# Patient Record
Sex: Female | Born: 1935 | Race: White | Hispanic: No | Smoking: Former smoker
Health system: Southern US, Community
[De-identification: ages and names within clinical notes are randomized; demographics above are authoritative.]

## PROBLEM LIST (undated history)

## (undated) DIAGNOSIS — I6523 Occlusion and stenosis of bilateral carotid arteries: Secondary | ICD-10-CM

## (undated) DIAGNOSIS — K219 Gastro-esophageal reflux disease without esophagitis: Secondary | ICD-10-CM

## (undated) DIAGNOSIS — R0600 Dyspnea, unspecified: Secondary | ICD-10-CM

## (undated) DIAGNOSIS — I7143 Infrarenal abdominal aortic aneurysm, without rupture: Secondary | ICD-10-CM

## (undated) DIAGNOSIS — D631 Anemia in chronic kidney disease: Secondary | ICD-10-CM

## (undated) DIAGNOSIS — E119 Type 2 diabetes mellitus without complications: Secondary | ICD-10-CM

## (undated) DIAGNOSIS — I10 Essential (primary) hypertension: Secondary | ICD-10-CM

## (undated) DIAGNOSIS — N189 Chronic kidney disease, unspecified: Secondary | ICD-10-CM

## (undated) DIAGNOSIS — N184 Chronic kidney disease, stage 4 (severe): Secondary | ICD-10-CM

## (undated) DIAGNOSIS — K449 Diaphragmatic hernia without obstruction or gangrene: Secondary | ICD-10-CM

## (undated) DIAGNOSIS — Z974 Presence of external hearing-aid: Secondary | ICD-10-CM

## (undated) DIAGNOSIS — Z973 Presence of spectacles and contact lenses: Secondary | ICD-10-CM

## (undated) DIAGNOSIS — F32A Depression, unspecified: Secondary | ICD-10-CM

## (undated) DIAGNOSIS — Z8679 Personal history of other diseases of the circulatory system: Secondary | ICD-10-CM

## (undated) DIAGNOSIS — N201 Calculus of ureter: Secondary | ICD-10-CM

## (undated) DIAGNOSIS — I714 Abdominal aortic aneurysm, without rupture: Secondary | ICD-10-CM

## (undated) DIAGNOSIS — N200 Calculus of kidney: Secondary | ICD-10-CM

## (undated) DIAGNOSIS — I700 Atherosclerosis of aorta: Secondary | ICD-10-CM

## (undated) DIAGNOSIS — Z8616 Personal history of COVID-19: Secondary | ICD-10-CM

## (undated) DIAGNOSIS — E039 Hypothyroidism, unspecified: Secondary | ICD-10-CM

## (undated) DIAGNOSIS — H269 Unspecified cataract: Secondary | ICD-10-CM

## (undated) DIAGNOSIS — R0609 Other forms of dyspnea: Secondary | ICD-10-CM

## (undated) DIAGNOSIS — I447 Left bundle-branch block, unspecified: Secondary | ICD-10-CM

## (undated) DIAGNOSIS — N135 Crossing vessel and stricture of ureter without hydronephrosis: Secondary | ICD-10-CM

## (undated) DIAGNOSIS — G4730 Sleep apnea, unspecified: Secondary | ICD-10-CM

## (undated) DIAGNOSIS — J449 Chronic obstructive pulmonary disease, unspecified: Secondary | ICD-10-CM

## (undated) DIAGNOSIS — Z87442 Personal history of urinary calculi: Secondary | ICD-10-CM

## (undated) DIAGNOSIS — K08109 Complete loss of teeth, unspecified cause, unspecified class: Secondary | ICD-10-CM

## (undated) DIAGNOSIS — N261 Atrophy of kidney (terminal): Secondary | ICD-10-CM

## (undated) DIAGNOSIS — N2581 Secondary hyperparathyroidism of renal origin: Secondary | ICD-10-CM

## (undated) HISTORY — PX: URETEROSCOPY WITH HOLMIUM LASER LITHOTRIPSY: SHX6645

---

## 1999-08-14 ENCOUNTER — Encounter: Payer: Self-pay | Admitting: Geriatric Medicine

## 1999-08-14 ENCOUNTER — Encounter: Admission: RE | Admit: 1999-08-14 | Discharge: 1999-08-14 | Payer: Self-pay | Admitting: Geriatric Medicine

## 2000-10-04 ENCOUNTER — Encounter: Payer: Self-pay | Admitting: Internal Medicine

## 2000-10-04 ENCOUNTER — Encounter: Admission: RE | Admit: 2000-10-04 | Discharge: 2000-10-04 | Payer: Self-pay | Admitting: Internal Medicine

## 2000-10-07 ENCOUNTER — Encounter: Admission: RE | Admit: 2000-10-07 | Discharge: 2000-10-07 | Payer: Self-pay | Admitting: Internal Medicine

## 2000-10-07 ENCOUNTER — Encounter: Payer: Self-pay | Admitting: Internal Medicine

## 2000-10-13 ENCOUNTER — Encounter: Payer: Self-pay | Admitting: Urology

## 2000-10-14 ENCOUNTER — Encounter: Payer: Self-pay | Admitting: Urology

## 2000-10-14 ENCOUNTER — Ambulatory Visit (HOSPITAL_COMMUNITY): Admission: RE | Admit: 2000-10-14 | Discharge: 2000-10-14 | Payer: Self-pay | Admitting: Urology

## 2002-07-31 ENCOUNTER — Encounter: Admission: RE | Admit: 2002-07-31 | Discharge: 2002-07-31 | Payer: Self-pay | Admitting: Geriatric Medicine

## 2002-07-31 ENCOUNTER — Encounter: Payer: Self-pay | Admitting: Geriatric Medicine

## 2005-04-22 ENCOUNTER — Encounter: Admission: RE | Admit: 2005-04-22 | Discharge: 2005-04-22 | Payer: Self-pay | Admitting: Internal Medicine

## 2005-07-31 ENCOUNTER — Encounter: Admission: RE | Admit: 2005-07-31 | Discharge: 2005-07-31 | Payer: Self-pay | Admitting: Internal Medicine

## 2006-02-11 ENCOUNTER — Other Ambulatory Visit: Admission: RE | Admit: 2006-02-11 | Discharge: 2006-02-11 | Payer: Self-pay | Admitting: Internal Medicine

## 2006-10-15 ENCOUNTER — Encounter: Admission: RE | Admit: 2006-10-15 | Discharge: 2006-10-15 | Payer: Self-pay | Admitting: Internal Medicine

## 2007-02-24 ENCOUNTER — Other Ambulatory Visit: Admission: RE | Admit: 2007-02-24 | Discharge: 2007-02-24 | Payer: Self-pay | Admitting: Internal Medicine

## 2007-03-04 ENCOUNTER — Encounter: Admission: RE | Admit: 2007-03-04 | Discharge: 2007-03-04 | Payer: Self-pay | Admitting: Internal Medicine

## 2007-05-30 ENCOUNTER — Ambulatory Visit (HOSPITAL_COMMUNITY): Admission: RE | Admit: 2007-05-30 | Discharge: 2007-05-30 | Payer: Self-pay | Admitting: Urology

## 2010-10-14 NOTE — Op Note (Signed)
Tami Richards, Tami Richards      ACCOUNT NO.:  [id number]   MEDICAL RECORD NO.:  [id number]          PATIENT TYPE:  AMB   LOCATION:  DAY                          FACILITY:  [facility]   PHYSICIAN:  Lillette Boxer. Dahlstedt, M.D.DATE OF BIRTH:  1935/02/21   DATE OF PROCEDURE:  05/30/2007  DATE OF DISCHARGE:                               OPERATIVE REPORT   PREOPERATIVE DIAGNOSIS:  Left ureteral calculi.   POSTOPERATIVE DIAGNOSIS:  Left ureteral calculi.   PRINCIPAL PROCEDURES:  Cystoscopy, left retrograde ureteral pyelogram,  left ureteroscopy, holmium laser fragmentation of left ureteral calculi,  basket extraction of calculi, double-J stent placement on the left.   SURGEON:  Dahlstedt   ANESTHESIA:  General with LMA.   COMPLICATIONS:  None.   BRIEF HISTORY:  A 76 year old female presents at this time for  definitive treatment of left distal ureteral stones.  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  She has had nonpassage of stones which she has had at least  for several months.  There is some left renal atrophy.  She presents at  this time for definitive treatment.  Risks and complications of the  procedure have been discussed with the patient.  She understands these  and desires to proceed.   DESCRIPTION OF PROCEDURE:  The patient was administered preoperative IV  antibiotics.  The surgical side was marked.  She was taken to the  operating room where general anesthetic was administered using the LMA.  She was placed in a dorsal lithotomy position.  The genitalia and  perineum were prepped and draped.  A 22-French panendoscope was passed  into her bladder which appeared to be normal.  A 6-French open-ended  catheter was used to perform retrograde pyelogram on the left.   There were two filling defects in the distal ureter.  There was no  significant hydroureter proximal to this but the pyelocaliceal system on  the left was  dilated.  No filling defects were seen there.  At this  point a guidewire was passed through the left ureter using fluoroscopic  guidance.  The open-ended catheter was removed.  A ureteroscope replaced  the cystoscope.  The stones were encountered about 1 cm up the left  ureter.  They were then fragmented into multiple pieces with the 365  micron laser fiber set at a power of 1 joule.  Multiple small fragments  were then performed.  The largest of these fragments were then extracted  into the bladder using a nitinol basket.  Several passes were made to  extract the larger stones.  Small powder-like fragments were left in  situ.  The stones were then irrigated out of the bladder with the  cystoscope.  A double-J stent was then placed over the guidewire using a  6-French 24-cm Contour stent.  The string was left on the distal end.  Good curls were seen proximally and distally  once the guidewire was removed.  The bladder was then drained and the  scope removed.  The string was taped to the patient's inner thigh.   The patient tolerated  this procedure well.  She was awakened and taken  to the PACU in stable condition.      Lillette Boxer. Dahlstedt, M.D.  Electronically Signed     SMD/MEDQ  D:  05/30/2007  T:  05/30/2007  Job:  [id number]

## 2010-10-17 NOTE — Op Note (Signed)
[facility]  Patient:    Tami Richards, Tami Richards             MRN: [id number] Proc. Date: 10/14/00 Adm. Date:  [id number] Attending:  Paschal Dopp                           Operative Report  PREOPERATIVE DIAGNOSIS:  Left mid ureteral stone.  POSTOPERATIVE DIAGNOSIS:  Left mid ureteral stone.  PRINCIPAL PROCEDURE:  Cystoscopy, left retrograde ureteropyelogram, holmium laser lithotripsy ureteroscopically of left mid ureteral calculus.  SURGEON:  Lillette Boxer. Dahlstedt, M.D.  ANESTHESIA:  General with LMA.  COMPLICATIONS:  None.  BRIEF HISTORY:  A 76 year old female with a six week history of left flank pain.  She was recently found to have a left mid ureteral stone.  It was recommended that the patient have treatment of this stone, as she has recurrent pain with no progression of her stone over the past month and a half.  The risks and complications of all treatments including lithotripsy, ureteroscopy with laser, and basket extraction were discussed.  The patient had decided to proceed with ureteroscopy.  I have recommended laser lithotripsy.  She presents at this time knowing the risks and complications and desires to proceed.  DESCRIPTION OF PROCEDURE:  The patient was administered a general anesthetic using the LMA.  One gram of Ancef was administered preoperatively.  She was placed in the dorsal lithotomy position with genitalia and perineum prepped and draped.  A cystoscope was advanced into her bladder which was cystoscopically normal.  The left ureter was cannulated with an end-hole catheter, and retrograde revealed filling defect overlying the distal sacral ureter.  At this point, the cystoscope was removed, and a 6 French ureteroscope was advanced up to this filling defect which was the stone. Using the laser fiber and the holmium laser at .5 joules, the stone was fragmented quite easily into multiple small fragments that would  easily pass. No other ureteral filling defects or stones were seen.  At this point, the ureteroscope was removed and the bladder drained.  The patient tolerated the procedure well.  She was awakened, extubated, and taken to PACU in stable condition. DD:  10/14/00 TD:  10/14/00 Job: [id number]

## 2011-03-06 LAB — CBC
Hemoglobin: 13.6
MCHC: 34.6
Platelets: 239
RBC: 4.35
RDW: 14

## 2011-03-06 LAB — BASIC METABOLIC PANEL
CO2: 29
Chloride: 103
Creatinine, Ser: 1.56 — ABNORMAL HIGH
GFR calc Af Amer: 40 — ABNORMAL LOW
Sodium: 142

## 2011-03-06 LAB — URINALYSIS, ROUTINE W REFLEX MICROSCOPIC
Glucose, UA: NEGATIVE
Hgb urine dipstick: NEGATIVE
Nitrite: NEGATIVE
Protein, ur: NEGATIVE
Urobilinogen, UA: 1

## 2011-10-01 ENCOUNTER — Encounter (HOSPITAL_COMMUNITY): Payer: Self-pay | Admitting: Emergency Medicine

## 2011-10-01 ENCOUNTER — Emergency Department (HOSPITAL_COMMUNITY)
Admission: EM | Admit: 2011-10-01 | Discharge: 2011-10-01 | Disposition: A | Discharge status: Hospice | Payer: Self-pay | Attending: Emergency Medicine | Admitting: Emergency Medicine

## 2011-10-01 DIAGNOSIS — H5789 Other specified disorders of eye and adnexa: Secondary | ICD-10-CM | POA: Insufficient documentation

## 2011-10-01 DIAGNOSIS — K219 Gastro-esophageal reflux disease without esophagitis: Secondary | ICD-10-CM | POA: Insufficient documentation

## 2011-10-01 DIAGNOSIS — I10 Essential (primary) hypertension: Secondary | ICD-10-CM | POA: Insufficient documentation

## 2011-10-01 DIAGNOSIS — Z79899 Other long term (current) drug therapy: Secondary | ICD-10-CM | POA: Insufficient documentation

## 2011-10-01 DIAGNOSIS — Z87442 Personal history of urinary calculi: Secondary | ICD-10-CM | POA: Insufficient documentation

## 2011-10-01 DIAGNOSIS — L259 Unspecified contact dermatitis, unspecified cause: Secondary | ICD-10-CM | POA: Insufficient documentation

## 2011-10-01 HISTORY — DX: Essential (primary) hypertension: I10

## 2011-10-01 HISTORY — DX: Gastro-esophageal reflux disease without esophagitis: K21.9

## 2011-10-01 HISTORY — DX: Calculus of kidney: N20.0

## 2011-10-01 MED ORDER — DIPHENHYDRAMINE HCL 25 MG PO CAPS
25.0000 mg | ORAL_CAPSULE | Freq: Once | ORAL | Status: AC
  Administered 2011-10-01: 25 mg via ORAL
  Filled 2011-10-01: qty 1

## 2011-10-01 MED ORDER — PREDNISONE 10 MG PO TABS: 20.0000 mg | ORAL_TABLET | Freq: Every day | ORAL | Status: DC

## 2011-10-01 MED ORDER — PREDNISONE 20 MG PO TABS
60.0000 mg | ORAL_TABLET | Freq: Once | ORAL | Status: AC
  Administered 2011-10-01: 60 mg via ORAL
  Filled 2011-10-01: qty 3

## 2011-10-01 NOTE — ED Provider Notes (Signed)
History  Scribed for Shaune Pollack, MD, the patient was seen in room STRE5/STRE5. This chart was scribed by Truddie Coco. The patient's care started at 1:37 PM    CSN: [id number]  Arrival date & time 10/01/11  1221   First MD Initiated Contact with Patient 10/01/11 1319      Chief Complaint  Patient presents with  . Rash     HPI GENEVIE FEURTADO is a 77 y.o. female who presents to the Emergency Department complaining of a rash that started on her face about six days ago.  She states that the next day her eyes began to swell and the rash had spread all over her face.  She denies any animals in the house, new creams or lotions, or any time spent outside.  Pt states that she has taken cortaid and anti itch and burn cream with no relief.  She has h/o HTN.  She denies difficulty swallowing or speaking.  Pt does not have a PCP at this time.  Was given information on the [facility].   Past Medical History  Diagnosis Date  . Hypertension   . Kidney stones   . Acid reflux     No past surgical history on file.  No family history on file.  History  Substance Use Topics  . Smoking status: Former Research scientist (life sciences)  . Smokeless tobacco: Not on file  . Alcohol Use: No    OB History    Grav Para Term Preterm Abortions TAB SAB Ect Mult Living                  Review of Systems  Skin: Positive for rash (on face).  All other systems reviewed and are negative.    Allergies  Review of patient's allergies indicates no known allergies.  Home Medications   Current Outpatient Rx  Name Route Sig Dispense Refill  . ACETAMINOPHEN 500 MG PO TABS Oral Take 500-1,000 mg by mouth every 6 (six) hours as needed. For pain    . B COMPLEX-C PO TABS Oral Take 1 tablet by mouth daily.    . CHOLECALCIFEROL 400 UNITS PO TABS Oral Take 400 Units by mouth daily.    Marland Kitchen FENUGREEK 500 MG PO CAPS Oral Take 1 tablet by mouth daily.    Marland Kitchen FOLATE-B12-INTRINSIC FACTOR 800-500-20 MCG-MCG-MG PO TABS  Oral Take 1 tablet by mouth daily.    Marland Kitchen HYDROCORTISONE 1 % EX CREA Topical Apply 1 application topically 2 (two) times daily as needed. For itching    . IBUPROFEN 200 MG PO TABS Oral Take 400 mg by mouth every 6 (six) hours as needed. For pain    . LORATADINE 10 MG PO TABS Oral Take 10-20 mg by mouth daily.    Marland Kitchen MAGNESIUM 100 MG PO CAPS Oral Take 1 capsule by mouth daily.    . ADULT MULTIVITAMIN W/MINERALS CH Oral Take 1 tablet by mouth daily.    Marland Kitchen OVER THE COUNTER MEDICATION Oral Take 1 application by mouth 3 (three) times daily as needed. Dr Bea Graff anti itch cream      BP 163/87  Pulse 98  Temp 98.3 F (36.8 C)  Resp 16  SpO2 99%  Physical Exam  Nursing note and vitals reviewed. Constitutional: She is oriented to person, place, and time. She appears well-developed and well-nourished. No distress.  HENT:  Head: Normocephalic and atraumatic.  Mouth/Throat: No oropharyngeal exudate.  Eyes: EOM are normal.  Neck: Normal range of motion. Neck supple.  Neurological: She is alert and oriented to person, place, and time.  Skin: Rash (on face) noted. She is not diaphoretic.       Macular papular diffuse facial rash, blanching, erythremic.   Psychiatric: She has a normal mood and affect. Her behavior is normal.    ED Course  Procedures  DIAGNOSTIC STUDIES: Oxygen Saturation is 99% on room air, normal by my interpretation.    COORDINATION OF CARE:     Labs Reviewed - No data to display No results found.   No diagnosis found. Patient with rash on face consistent with contact dermatitis. She is to be with prednisone and Benadryl. She is advised regarding followup of her hypertension. She states she is unable to obtain care due to financial reasons. Again patient advised to followup with health syrup or [facility] clinic   I personally performed the services described in this documentation, which was scribed in my presence. The recorded information has been reviewed and  considered.        Shaune Pollack, MD 10/01/11 1758

## 2011-10-01 NOTE — ED Notes (Signed)
Broke out in rash around eyes since sat am   Family has poison oak and just visited her

## 2011-10-01 NOTE — Discharge Instructions (Signed)
Contact Dermatitis Contact dermatitis is a reaction to certain substances that touch the skin. Contact dermatitis can be either irritant contact dermatitis or allergic contact dermatitis. Irritant contact dermatitis does not require previous exposure to the substance for a reaction to occur.Allergic contact dermatitis only occurs if you have been exposed to the substance before. Upon a repeat exposure, your body reacts to the substance.  CAUSES  Many substances can cause contact dermatitis. Irritant dermatitis is most commonly caused by repeated exposure to mildly irritating substances, such as:  Makeup.   Soaps.   Detergents.   Bleaches.   Acids.   Metal salts, such as nickel.  Allergic contact dermatitis is most commonly caused by exposure to:  Poisonous plants.   Chemicals (deodorants, shampoos).   Jewelry.   Latex.   Neomycin in triple antibiotic cream.   Preservatives in products, including clothing.  SYMPTOMS  The area of skin that is exposed may develop:  Dryness or flaking.   Redness.   Cracks.   Itching.   Pain or a burning sensation.   Blisters.  With allergic contact dermatitis, there may also be swelling in areas such as the eyelids, mouth, or genitals.  DIAGNOSIS  Your caregiver can usually tell what the problem is by doing a physical exam. In cases where the cause is uncertain and an allergic contact dermatitis is suspected, a patch skin test may be performed to help determine the cause of your dermatitis. TREATMENT Treatment includes protecting the skin from further contact with the irritating substance by avoiding that substance if possible. Barrier creams, powders, and gloves may be helpful. Your caregiver may also recommend:  Steroid creams or ointments applied 2 times daily. For best results, soak the rash area in cool water for 20 minutes. Then apply the medicine. Cover the area with a plastic wrap. You can store the steroid cream in the  refrigerator for a "chilly" effect on your rash. That may decrease itching. Oral steroid medicines may be needed in more severe cases.   Antibiotics or antibacterial ointments if a skin infection is present.   Antihistamine lotion or an antihistamine taken by mouth to ease itching.   Lubricants to keep moisture in your skin.   Burow's solution to reduce redness and soreness or to dry a weeping rash. Mix one packet or tablet of solution in 2 cups cool water. Dip a clean washcloth in the mixture, wring it out a bit, and put it on the affected area. Leave the cloth in place for 30 minutes. Do this as often as possible throughout the day.   Taking several cornstarch or baking soda baths daily if the area is too large to cover with a washcloth.  Harsh chemicals, such as alkalis or acids, can cause skin damage that is like a burn. You should flush your skin for 15 to 20 minutes with cold water after such an exposure. You should also seek immediate medical care after exposure. Bandages (dressings), antibiotics, and pain medicine may be needed for severely irritated skin.  HOME CARE INSTRUCTIONS  Avoid the substance that caused your reaction.   Keep the area of skin that is affected away from hot water, soap, sunlight, chemicals, acidic substances, or anything else that would irritate your skin.   Do not scratch the rash. Scratching may cause the rash to become infected.   You may take cool baths to help stop the itching.   Only take over-the-counter or prescription medicines as directed by your caregiver.     See your caregiver for follow-up care as directed to make sure your skin is healing properly.  SEEK MEDICAL CARE IF:   Your condition is not better after 3 days of treatment.   You seem to be getting worse.   You see signs of infection such as swelling, tenderness, redness, soreness, or warmth in the affected area.   You have any problems related to your medicines.  Document Released:  05/15/2000 Document Revised: 05/07/2011 Document Reviewed: 10/21/2010 [facility] Patient Information 2012 [place], [place].Hypertension As your heart beats, it forces blood through your arteries. This force is your blood pressure. If the pressure is too high, it is called hypertension (HTN) or high blood pressure. HTN is dangerous because you may have it and not know it. High blood pressure may mean that your heart has to work harder to pump blood. Your arteries may be narrow or stiff. The extra work puts you at risk for heart disease, stroke, and other problems.  Blood pressure consists of two numbers, a higher number over a lower, 110/72, for example. It is stated as "110 over 72." The ideal is below 120 for the top number (systolic) and under 80 for the bottom (diastolic). Write down your blood pressure today. You should pay close attention to your blood pressure if you have certain conditions such as:  Heart failure.   Prior heart attack.   Diabetes   Chronic kidney disease.   Prior stroke.   Multiple risk factors for heart disease.  To see if you have HTN, your blood pressure should be measured while you are seated with your arm held at the level of the heart. It should be measured at least twice. A one-time elevated blood pressure reading (especially in the Emergency Department) does not mean that you need treatment. There may be conditions in which the blood pressure is different between your right and left arms. It is important to see your caregiver soon for a recheck. Most people have essential hypertension which means that there is not a specific cause. This type of high blood pressure may be lowered by changing lifestyle factors such as:  Stress.   Smoking.   Lack of exercise.   Excessive weight.   Drug/tobacco/alcohol use.   Eating less salt.  Most people do not have symptoms from high blood pressure until it has caused damage to the body. Effective treatment can often  prevent, delay or reduce that damage. TREATMENT  When a cause has been identified, treatment for high blood pressure is directed at the cause. There are a large number of medications to treat HTN. These fall into several categories, and your caregiver will help you select the medicines that are best for you. Medications may have side effects. You should review side effects with your caregiver. If your blood pressure stays high after you have made lifestyle changes or started on medicines,   Your medication(s) may need to be changed.   Other problems may need to be addressed.   Be certain you understand your prescriptions, and know how and when to take your medicine.   Be sure to follow up with your caregiver within the time frame advised (usually within two weeks) to have your blood pressure rechecked and to review your medications.   If you are taking more than one medicine to lower your blood pressure, make sure you know how and at what times they should be taken. Taking two medicines at the same time can result in blood pressure that  is too low.  SEEK IMMEDIATE MEDICAL CARE IF:  You develop a severe headache, blurred or changing vision, or confusion.   You have unusual weakness or numbness, or a faint feeling.   You have severe chest or abdominal pain, vomiting, or breathing problems.  MAKE SURE YOU:   Understand these instructions.   Will watch your condition.   Will get help right away if you are not doing well or get worse.  Document Released: 05/18/2005 Document Revised: 05/07/2011 Document Reviewed: 01/06/2008 [facility] Patient Information 2012 [place].   Use diphenhydramine 25 mg over the counter every six hours until symptoms resolve.

## 2013-08-22 ENCOUNTER — Other Ambulatory Visit: Payer: Self-pay | Admitting: Internal Medicine

## 2013-08-22 ENCOUNTER — Ambulatory Visit
Admission: RE | Admit: 2013-08-22 | Discharge: 2013-08-22 | Disposition: A | Discharge status: Home / Self Care | Payer: No Typology Code available for payment source | Source: Ambulatory Visit | Attending: Internal Medicine | Admitting: Internal Medicine

## 2013-08-22 DIAGNOSIS — R0602 Shortness of breath: Secondary | ICD-10-CM

## 2015-03-04 ENCOUNTER — Encounter (HOSPITAL_COMMUNITY): Payer: Self-pay | Admitting: Emergency Medicine

## 2015-03-04 ENCOUNTER — Emergency Department (HOSPITAL_COMMUNITY): Payer: No Typology Code available for payment source

## 2015-03-04 ENCOUNTER — Emergency Department (HOSPITAL_COMMUNITY)
Admission: EM | Admit: 2015-03-04 | Discharge: 2015-03-04 | Disposition: A | Discharge status: Home / Self Care | Payer: No Typology Code available for payment source | Attending: Emergency Medicine | Admitting: Emergency Medicine

## 2015-03-04 DIAGNOSIS — Z87442 Personal history of urinary calculi: Secondary | ICD-10-CM | POA: Insufficient documentation

## 2015-03-04 DIAGNOSIS — S0081XA Abrasion of other part of head, initial encounter: Secondary | ICD-10-CM | POA: Diagnosis not present

## 2015-03-04 DIAGNOSIS — Y998 Other external cause status: Secondary | ICD-10-CM | POA: Diagnosis not present

## 2015-03-04 DIAGNOSIS — Z8719 Personal history of other diseases of the digestive system: Secondary | ICD-10-CM | POA: Insufficient documentation

## 2015-03-04 DIAGNOSIS — S2241XA Multiple fractures of ribs, right side, initial encounter for closed fracture: Secondary | ICD-10-CM | POA: Diagnosis not present

## 2015-03-04 DIAGNOSIS — Z7952 Long term (current) use of systemic steroids: Secondary | ICD-10-CM | POA: Diagnosis not present

## 2015-03-04 DIAGNOSIS — Z79899 Other long term (current) drug therapy: Secondary | ICD-10-CM | POA: Diagnosis not present

## 2015-03-04 DIAGNOSIS — S299XXA Unspecified injury of thorax, initial encounter: Secondary | ICD-10-CM | POA: Diagnosis present

## 2015-03-04 DIAGNOSIS — I10 Essential (primary) hypertension: Secondary | ICD-10-CM | POA: Insufficient documentation

## 2015-03-04 DIAGNOSIS — Z87891 Personal history of nicotine dependence: Secondary | ICD-10-CM | POA: Insufficient documentation

## 2015-03-04 DIAGNOSIS — S2223XA Sternal manubrial dissociation, initial encounter for closed fracture: Secondary | ICD-10-CM | POA: Diagnosis not present

## 2015-03-04 DIAGNOSIS — Y92410 Unspecified street and highway as the place of occurrence of the external cause: Secondary | ICD-10-CM | POA: Insufficient documentation

## 2015-03-04 DIAGNOSIS — S2231XA Fracture of one rib, right side, initial encounter for closed fracture: Secondary | ICD-10-CM

## 2015-03-04 DIAGNOSIS — S2220XA Unspecified fracture of sternum, initial encounter for closed fracture: Secondary | ICD-10-CM

## 2015-03-04 DIAGNOSIS — Y9389 Activity, other specified: Secondary | ICD-10-CM | POA: Insufficient documentation

## 2015-03-04 MED ORDER — HYDROCODONE-ACETAMINOPHEN 5-325 MG PO TABS: 2.0000 | ORAL_TABLET | ORAL | Status: DC | PRN

## 2015-03-04 MED ORDER — IBUPROFEN 800 MG PO TABS
800.0000 mg | ORAL_TABLET | Freq: Once | ORAL | Status: AC
  Administered 2015-03-04: 800 mg via ORAL
  Filled 2015-03-04: qty 1

## 2015-03-04 MED ORDER — HYDROCODONE-ACETAMINOPHEN 5-325 MG PO TABS
2.0000 | ORAL_TABLET | Freq: Once | ORAL | Status: AC
  Administered 2015-03-04: 2 via ORAL
  Filled 2015-03-04: qty 2

## 2015-03-04 MED ORDER — IBUPROFEN 800 MG PO TABS: 800.0000 mg | ORAL_TABLET | Freq: Three times a day (TID) | ORAL | Status: DC

## 2015-03-04 NOTE — ED Notes (Signed)
Bed: WA02 Expected date:  Expected time:  Means of arrival:  Comments: EMS 

## 2015-03-04 NOTE — Discharge Instructions (Signed)
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
## 2016-04-02 ENCOUNTER — Other Ambulatory Visit: Payer: Self-pay | Admitting: Internal Medicine

## 2016-04-02 ENCOUNTER — Ambulatory Visit
Admission: RE | Admit: 2016-04-02 | Discharge: 2016-04-02 | Disposition: A | Discharge status: Home / Self Care | Payer: Medicare HMO | Source: Ambulatory Visit | Attending: Internal Medicine | Admitting: Internal Medicine

## 2016-04-02 DIAGNOSIS — J449 Chronic obstructive pulmonary disease, unspecified: Secondary | ICD-10-CM

## 2017-01-30 DIAGNOSIS — S32000A Wedge compression fracture of unspecified lumbar vertebra, initial encounter for closed fracture: Secondary | ICD-10-CM

## 2017-01-30 HISTORY — DX: Wedge compression fracture of unspecified lumbar vertebra, initial encounter for closed fracture: S32.000A

## 2017-02-11 ENCOUNTER — Encounter (HOSPITAL_COMMUNITY): Payer: Self-pay | Admitting: Emergency Medicine

## 2017-02-11 ENCOUNTER — Observation Stay (HOSPITAL_COMMUNITY)
Admission: EM | Admit: 2017-02-11 | Discharge: 2017-02-12 | Disposition: A | Discharge status: Home / Self Care | Payer: Medicare HMO | Attending: Emergency Medicine | Admitting: Emergency Medicine

## 2017-02-11 ENCOUNTER — Emergency Department (HOSPITAL_COMMUNITY): Payer: Medicare HMO

## 2017-02-11 DIAGNOSIS — M4316 Spondylolisthesis, lumbar region: Secondary | ICD-10-CM | POA: Diagnosis not present

## 2017-02-11 DIAGNOSIS — N289 Disorder of kidney and ureter, unspecified: Secondary | ICD-10-CM | POA: Diagnosis not present

## 2017-02-11 DIAGNOSIS — K5900 Constipation, unspecified: Principal | ICD-10-CM | POA: Insufficient documentation

## 2017-02-11 DIAGNOSIS — K219 Gastro-esophageal reflux disease without esophagitis: Secondary | ICD-10-CM | POA: Insufficient documentation

## 2017-02-11 DIAGNOSIS — Z87891 Personal history of nicotine dependence: Secondary | ICD-10-CM | POA: Insufficient documentation

## 2017-02-11 DIAGNOSIS — Z87442 Personal history of urinary calculi: Secondary | ICD-10-CM | POA: Insufficient documentation

## 2017-02-11 DIAGNOSIS — E1140 Type 2 diabetes mellitus with diabetic neuropathy, unspecified: Secondary | ICD-10-CM | POA: Diagnosis not present

## 2017-02-11 DIAGNOSIS — M5126 Other intervertebral disc displacement, lumbar region: Secondary | ICD-10-CM | POA: Insufficient documentation

## 2017-02-11 DIAGNOSIS — K449 Diaphragmatic hernia without obstruction or gangrene: Secondary | ICD-10-CM | POA: Diagnosis not present

## 2017-02-11 DIAGNOSIS — S32000A Wedge compression fracture of unspecified lumbar vertebra, initial encounter for closed fracture: Secondary | ICD-10-CM | POA: Diagnosis present

## 2017-02-11 DIAGNOSIS — S32010A Wedge compression fracture of first lumbar vertebra, initial encounter for closed fracture: Secondary | ICD-10-CM

## 2017-02-11 DIAGNOSIS — M5136 Other intervertebral disc degeneration, lumbar region: Secondary | ICD-10-CM | POA: Diagnosis not present

## 2017-02-11 DIAGNOSIS — W010XXA Fall on same level from slipping, tripping and stumbling without subsequent striking against object, initial encounter: Secondary | ICD-10-CM | POA: Diagnosis not present

## 2017-02-11 DIAGNOSIS — I700 Atherosclerosis of aorta: Secondary | ICD-10-CM | POA: Insufficient documentation

## 2017-02-11 DIAGNOSIS — N390 Urinary tract infection, site not specified: Secondary | ICD-10-CM | POA: Diagnosis present

## 2017-02-11 DIAGNOSIS — M549 Dorsalgia, unspecified: Secondary | ICD-10-CM | POA: Diagnosis present

## 2017-02-11 DIAGNOSIS — I10 Essential (primary) hypertension: Secondary | ICD-10-CM | POA: Diagnosis not present

## 2017-02-11 DIAGNOSIS — N179 Acute kidney failure, unspecified: Secondary | ICD-10-CM | POA: Diagnosis present

## 2017-02-11 DIAGNOSIS — E039 Hypothyroidism, unspecified: Secondary | ICD-10-CM | POA: Diagnosis present

## 2017-02-11 DIAGNOSIS — M4856XA Collapsed vertebra, not elsewhere classified, lumbar region, initial encounter for fracture: Secondary | ICD-10-CM | POA: Diagnosis not present

## 2017-02-11 DIAGNOSIS — E119 Type 2 diabetes mellitus without complications: Secondary | ICD-10-CM

## 2017-02-11 DIAGNOSIS — N3000 Acute cystitis without hematuria: Secondary | ICD-10-CM

## 2017-02-11 LAB — CBC WITH DIFFERENTIAL/PLATELET
Basophils Absolute: 0 10*3/uL (ref 0.0–0.1)
Basophils Relative: 0 %
EOS ABS: 0.2 10*3/uL (ref 0.0–0.7)
EOS PCT: 4 %
HCT: 36.1 % (ref 36.0–46.0)
Hemoglobin: 12.3 g/dL (ref 12.0–15.0)
LYMPHS ABS: 2.1 10*3/uL (ref 0.7–4.0)
Lymphocytes Relative: 31 %
MCH: 31.1 pg (ref 26.0–34.0)
MCHC: 34.1 g/dL (ref 30.0–36.0)
MCV: 91.4 fL (ref 78.0–100.0)
MONO ABS: 0.5 10*3/uL (ref 0.1–1.0)
MONOS PCT: 8 %
Neutro Abs: 4 10*3/uL (ref 1.7–7.7)
Neutrophils Relative %: 57 %
PLATELETS: 343 10*3/uL (ref 150–400)
RBC: 3.95 MIL/uL (ref 3.87–5.11)
RDW: 14.6 % (ref 11.5–15.5)
WBC: 6.9 10*3/uL (ref 4.0–10.5)

## 2017-02-11 NOTE — ED Provider Notes (Signed)
[facility] DEPT Provider Note   CSN: [id number] Arrival date & time: 02/11/17  1814     History   Chief Complaint Chief Complaint  Patient presents with  . constipation    HPI Tami Richards is a 82 y.o. female.  HPI atient presents to the emergency room for evaluation of a couple of issues. Her primary issue is that she has been having trouble with constipation since the last couple weeks of August. Patient was having severe constipation.  She has tried multiple medications. Patient was having severe pain on August 24 and thought she was going to have to come to the hospital but ultimately had a very large bowel movement.  Patient ended up following up with her primary care doctor on the 27th.He recommended increasing her stool softeners. She was also given medications for a urinary tract infection and her diabetic neuropathy. Couple days later the patient had a fall. Since that time she has had pain in her right hip and lower back. It increases whenever she tries to move or walk around. She also does not think she's had a normal bowel movement since that episode on the 24th. She denies any nausea or vomiting. No fevers or chills. Her symptoms have been progressing and she has not been able to have a good bowel movement so she came to the ED. Past Medical History:  Diagnosis Date  . Acid reflux   . Hypertension   . Kidney stones     There are no active problems to display for this patient.   History reviewed. No pertinent surgical history.  OB History    No data available       Home Medications    Prior to Admission medications   Medication Sig Start Date End Date Taking? Authorizing Provider  acetaminophen (TYLENOL) 500 MG tablet Take 500-1,000 mg by mouth every 6 (six) hours as needed. For pain    [provider]  B Complex-C (B-COMPLEX WITH VITAMIN C) tablet Take 1 tablet by mouth daily.    [provider]  cholecalciferol (VITAMIN D)  400 UNITS TABS Take 400 Units by mouth daily.    [provider]  Fenugreek 500 MG CAPS Take 1 tablet by mouth daily.    [provider]  Folate-B12-Intrinsic Factor [id number] MCG-MCG-MG TABS Take 1 tablet by mouth daily.    [provider]  HYDROcodone-acetaminophen (NORCO/VICODIN) 5-325 MG tablet Take 2 tablets by mouth every 4 (four) hours as needed. 03/04/15   Noemi Chapel, MD  hydrocortisone cream 1 % Apply 1 application topically 2 (two) times daily as needed. For itching    [provider]  ibuprofen (ADVIL,MOTRIN) 800 MG tablet Take 1 tablet (800 mg total) by mouth 3 (three) times daily. 03/04/15   Noemi Chapel, MD  loratadine (CLARITIN) 10 MG tablet Take 10-20 mg by mouth daily.    [provider]  Magnesium 100 MG CAPS Take 1 capsule by mouth daily.    [provider]  Multiple Vitamin (MULITIVITAMIN WITH MINERALS) TABS Take 1 tablet by mouth daily.    [provider]  OVER THE COUNTER MEDICATION Take 1 application by mouth 3 (three) times daily as needed. Dr Bea Graff anti itch cream    [provider]  predniSONE (DELTASONE) 10 MG tablet Take 2 tablets (20 mg total) by mouth daily. 10/01/11   Pattricia Boss, MD    Family History No family history on file.  Social History Social History  Substance Use Topics  .  Smoking status: Former Research scientist (life sciences)  . Smokeless tobacco: Never Used  . Alcohol use No     Allergies   Patient has no known allergies.   Review of Systems Review of Systems  Constitutional: Negative for fever.  Gastrointestinal: Negative for nausea and vomiting.  Genitourinary: Negative for dysuria.  All other systems reviewed and are negative.    Physical Exam Updated Vital Signs BP (!) 141/61 (BP Location: Left Arm)   Pulse 70   Temp 97.9 F (36.6 C) (Oral)   Resp 18   SpO2 97%   Physical Exam  Constitutional: No distress.  HENT:  Head: Normocephalic and atraumatic.  Right Ear:  External ear normal.  Left Ear: External ear normal.  Eyes: Conjunctivae are normal. Right eye exhibits no discharge. Left eye exhibits no discharge. No scleral icterus.  Neck: Neck supple. No tracheal deviation present.  Cardiovascular: Normal rate, regular rhythm and intact distal pulses.   Pulmonary/Chest: Effort normal and breath sounds normal. No stridor. No respiratory distress. She has no wheezes. She has no rales.  Abdominal: Soft. Bowel sounds are normal. She exhibits no distension. There is no tenderness. There is no rebound and no guarding.  Genitourinary:  Genitourinary Comments: Normal rectal exam, no stool in the vault, no fecal impaction  Musculoskeletal: She exhibits no edema or tenderness.  Neurological: She is alert. She has normal strength. No cranial nerve deficit (no facial droop, extraocular movements intact, no slurred speech) or sensory deficit. She exhibits normal muscle tone. She displays no seizure activity. Coordination normal.  Skin: Skin is warm and dry. No rash noted. She is not diaphoretic.  Psychiatric: She has a normal mood and affect.  Nursing note and vitals reviewed.    ED Treatments / Results  Labs (all labs ordered are listed, but only abnormal results are displayed) Labs Reviewed  CBC WITH DIFFERENTIAL/PLATELET  COMPREHENSIVE METABOLIC PANEL    Radiology Dg Lumbar Spine Complete  Result Date: 02/12/2017 CLINICAL DATA:  Midline lower back pain after fall on 01/27/2017. EXAM: LUMBAR SPINE - COMPLETE 4+ VIEW COMPARISON:  CXR from 04/02/2016 FINDINGS: New superior endplate compression fracture, possibly a mild burst fracture given minimal retropulsion noted of L1 with at least 25% height loss of the vertebral body anteriorly. There is degenerative disc disease at L4-5 and L5-S1 with grade 1 bordering grade 2 anterolisthesis of L5 on S1. Suggestion of a pars defect at L5 which may account for this. Rounded calcified appearing density in the right  hemiabdomen may reflect a large renal stone, gallstone, opacified diverticulum with lymph node among some possibilities though not exclusive. IMPRESSION: 1. New since 04/02/2016 is a superior endplate compression fracture of L1 with slight retropulsion suggested and therefore consistent with a mild burst fracture. CT for further characterization is suggested. 2. Lower lumbar degenerative disc disease L4 through S1 with grade 1 bordering grade 2 anterolisthesis of L5 on S1 possibly from a pars defect. 3. 12 mm calcified density in the right hemiabdomen with differential possibilities that may include a gallstone, renal stone, possibly a contrast filled diverticulum or lymph node among some possibilities. Electronically Signed   By: Ashley Royalty M.D.   On: 02/12/2017 00:03   Dg Abdomen Acute W/chest  Result Date: 02/11/2017 CLINICAL DATA:  The patient reports she has not had a bowel movement since 01/22/2017. EXAM: DG ABDOMEN ACUTE W/ 1V CHEST COMPARISON:  PA and lateral chest 04/02/2016. CT chest 05/04/2007. Chest and plain films of the ribs 03/04/2015. FINDINGS: Single-view of the chest  demonstrates clear lungs and normal heart size. No pneumothorax or pleural effusion. Aortic atherosclerosis is noted. Two views of the abdomen show no free intraperitoneal air. Large stool burden throughout the colon is noted. No evidence of bowel obstruction Calcification in the right upper quadrant of the abdomen is compatible with nonobstructing renal stone seen on prior exams. IMPRESSION: No acute abnormality. Large colonic stool burden. Unchanged calcification the right upper quadrant the abdomen consistent with a nonobstructing renal stone. Electronically Signed   By: Inge Rise M.D.   On: 02/11/2017 23:58   Dg Hip Unilat With Pelvis 2-3 Views Right  Result Date: 02/12/2017 CLINICAL DATA:  Right hip pain after fall. EXAM: DG HIP (WITH OR WITHOUT PELVIS) 2-3V RIGHT COMPARISON:  None. FINDINGS: There is no evidence  of hip fracture or dislocation. Slight bilateral hip joint space narrowing. This is likely degenerative. Lower lumbar facet arthropathy at L5-S1. No pelvic fracture or diastasis. IMPRESSION: No acute osseous abnormality of the pelvis and either hip. Slight degenerative joint space narrowing of both hips. There is lower lumbar degenerative facet arthropathy. Electronically Signed   By: Ashley Royalty M.D.   On: 02/12/2017 00:04    Procedures Procedures (including critical care time)  Medications Ordered in ED Medications  sodium phosphate (FLEET) 7-19 GM/118ML enema 1 enema (not administered)     Initial Impression / Assessment and Plan / ED Course  I have reviewed the triage vital signs and the nursing notes.  Pertinent labs & imaging results that were available during my care of the patient were reviewed by me and considered in my medical decision making (see chart for details).   Xray shows constipation without obstruction.  Plain films show burst fx l1.  This certainly could be causing her back pain.    Will ct to evaluate further.  Enema ordered for her constipation.  Dr Randal Buba will follow up on patient  Final Clinical Impressions(s) / ED Diagnoses   Final diagnoses:  Closed compression fracture of first lumbar vertebra, initial encounter (Miguel Barrera)  Constipation, unspecified constipation type      Dorie Rank, MD 02/12/17 [phone]

## 2017-02-11 NOTE — ED Triage Notes (Addendum)
Patient states that she had "blow out on August 24th" and hasnt had a BM since.  Patient has been taking OTC and home remedies for constipation but had no BM. Patient reports giving herself an enema 2 days ago with no relief of BM. Patient even tried disimpacting herself with her fingers "Like I did on the 24th and had the blow out, but I couldn't feel anything". Patient reports she is passing gas.  Patient even saw PCP on august 27th, already had patient on stool softener and was given antibiotics for UTI

## 2017-02-12 ENCOUNTER — Encounter (HOSPITAL_COMMUNITY): Payer: Self-pay | Admitting: Internal Medicine

## 2017-02-12 ENCOUNTER — Emergency Department (HOSPITAL_COMMUNITY): Payer: Medicare HMO

## 2017-02-12 DIAGNOSIS — N179 Acute kidney failure, unspecified: Secondary | ICD-10-CM | POA: Diagnosis present

## 2017-02-12 DIAGNOSIS — N390 Urinary tract infection, site not specified: Secondary | ICD-10-CM | POA: Diagnosis present

## 2017-02-12 DIAGNOSIS — E119 Type 2 diabetes mellitus without complications: Secondary | ICD-10-CM

## 2017-02-12 DIAGNOSIS — S32000A Wedge compression fracture of unspecified lumbar vertebra, initial encounter for closed fracture: Secondary | ICD-10-CM | POA: Diagnosis present

## 2017-02-12 DIAGNOSIS — M549 Dorsalgia, unspecified: Secondary | ICD-10-CM | POA: Diagnosis present

## 2017-02-12 DIAGNOSIS — I10 Essential (primary) hypertension: Secondary | ICD-10-CM | POA: Diagnosis present

## 2017-02-12 DIAGNOSIS — N3000 Acute cystitis without hematuria: Secondary | ICD-10-CM | POA: Insufficient documentation

## 2017-02-12 DIAGNOSIS — S32010A Wedge compression fracture of first lumbar vertebra, initial encounter for closed fracture: Secondary | ICD-10-CM | POA: Diagnosis present

## 2017-02-12 DIAGNOSIS — E039 Hypothyroidism, unspecified: Secondary | ICD-10-CM | POA: Diagnosis present

## 2017-02-12 LAB — URINALYSIS, ROUTINE W REFLEX MICROSCOPIC
BILIRUBIN URINE: NEGATIVE
GLUCOSE, UA: NEGATIVE mg/dL
HGB URINE DIPSTICK: NEGATIVE
Ketones, ur: NEGATIVE mg/dL
NITRITE: NEGATIVE
PH: 7 (ref 5.0–8.0)
Protein, ur: NEGATIVE mg/dL
Specific Gravity, Urine: 1.009 (ref 1.005–1.030)

## 2017-02-12 LAB — CREATININE, SERUM
CREATININE: 1.97 mg/dL — AB (ref 0.44–1.00)
GFR, EST AFRICAN AMERICAN: 26 mL/min — AB (ref >60)
GFR, EST NON AFRICAN AMERICAN: 23 mL/min — AB (ref >60)

## 2017-02-12 LAB — CBC
HCT: 34.1 % — ABNORMAL LOW (ref 36.0–46.0)
HEMOGLOBIN: 11.5 g/dL — AB (ref 12.0–15.0)
MCH: 30.4 pg (ref 26.0–34.0)
MCHC: 33.7 g/dL (ref 30.0–36.0)
MCV: 90.2 fL (ref 78.0–100.0)
PLATELETS: 380 10*3/uL (ref 150–400)
RBC: 3.78 MIL/uL — AB (ref 3.87–5.11)
RDW: 14.2 % (ref 11.5–15.5)
WBC: 8.5 10*3/uL (ref 4.0–10.5)

## 2017-02-12 LAB — COMPREHENSIVE METABOLIC PANEL
ALK PHOS: 93 U/L (ref 38–126)
ALT: 11 U/L — ABNORMAL LOW (ref 14–54)
ANION GAP: 7 (ref 5–15)
AST: 20 U/L (ref 15–41)
Albumin: 3.5 g/dL (ref 3.5–5.0)
BUN: 25 mg/dL — ABNORMAL HIGH (ref 6–20)
CO2: 29 mmol/L (ref 22–32)
Calcium: 9.7 mg/dL (ref 8.9–10.3)
Chloride: 103 mmol/L (ref 101–111)
Creatinine, Ser: 1.92 mg/dL — ABNORMAL HIGH (ref 0.44–1.00)
GFR, EST AFRICAN AMERICAN: 27 mL/min — AB (ref >60)
GFR, EST NON AFRICAN AMERICAN: 24 mL/min — AB (ref >60)
Glucose, Bld: 131 mg/dL — ABNORMAL HIGH (ref 65–99)
Potassium: 4 mmol/L (ref 3.5–5.1)
SODIUM: 139 mmol/L (ref 135–145)
Total Bilirubin: 0.8 mg/dL (ref 0.3–1.2)
Total Protein: 6.7 g/dL (ref 6.5–8.1)

## 2017-02-12 MED ORDER — LEVOTHYROXINE SODIUM 50 MCG PO TABS
50.0000 ug | ORAL_TABLET | Freq: Every day | ORAL | Status: DC
  Filled 2017-02-12: qty 1

## 2017-02-12 MED ORDER — ONDANSETRON HCL 4 MG PO TABS: 4.0000 mg | ORAL_TABLET | Freq: Four times a day (QID) | ORAL | Status: DC | PRN

## 2017-02-12 MED ORDER — ACETAMINOPHEN 325 MG PO TABS: 650.0000 mg | ORAL_TABLET | Freq: Four times a day (QID) | ORAL | Status: DC | PRN

## 2017-02-12 MED ORDER — ASPIRIN EC 81 MG PO TBEC
81.0000 mg | DELAYED_RELEASE_TABLET | Freq: Every day | ORAL | Status: DC
  Filled 2017-02-12: qty 1

## 2017-02-12 MED ORDER — ENOXAPARIN SODIUM 40 MG/0.4ML ~~LOC~~ SOLN: 40.0000 mg | SUBCUTANEOUS | Status: DC

## 2017-02-12 MED ORDER — ACETAMINOPHEN 325 MG PO TABS
650.0000 mg | ORAL_TABLET | Freq: Once | ORAL | Status: AC
  Administered 2017-02-12: 650 mg via ORAL
  Filled 2017-02-12: qty 2

## 2017-02-12 MED ORDER — METHOCARBAMOL 500 MG PO TABS: 500.0000 mg | ORAL_TABLET | Freq: Three times a day (TID) | ORAL | Status: DC | PRN

## 2017-02-12 MED ORDER — MILK AND MOLASSES ENEMA
1.0000 | Freq: Once | RECTAL | Status: DC
  Filled 2017-02-12: qty 250

## 2017-02-12 MED ORDER — LIDOCAINE 5 % EX PTCH: 1.0000 | MEDICATED_PATCH | CUTANEOUS | 0 refills | Status: DC

## 2017-02-12 MED ORDER — OXYCODONE HCL 5 MG PO TABS
5.0000 mg | ORAL_TABLET | Freq: Four times a day (QID) | ORAL | Status: DC | PRN
  Filled 2017-02-12: qty 1

## 2017-02-12 MED ORDER — FENTANYL CITRATE (PF) 100 MCG/2ML IJ SOLN: 50.0000 ug | Freq: Once | INTRAMUSCULAR | Status: DC

## 2017-02-12 MED ORDER — DEXTROSE 5 % IV SOLN: 1.0000 g | INTRAVENOUS | Status: DC

## 2017-02-12 MED ORDER — CEPHALEXIN 500 MG PO CAPS: 500.0000 mg | ORAL_CAPSULE | Freq: Four times a day (QID) | ORAL | 0 refills | Status: DC

## 2017-02-12 MED ORDER — SODIUM CHLORIDE 0.9 % IV BOLUS (SEPSIS)
500.0000 mL | Freq: Once | INTRAVENOUS | Status: AC
  Administered 2017-02-12: 04:00:00 via INTRAVENOUS

## 2017-02-12 MED ORDER — DEXTROSE 5 % IV SOLN
1.0000 g | Freq: Once | INTRAVENOUS | Status: AC
  Administered 2017-02-12: 1 g via INTRAVENOUS
  Filled 2017-02-12: qty 10

## 2017-02-12 MED ORDER — ATORVASTATIN CALCIUM 10 MG PO TABS
10.0000 mg | ORAL_TABLET | Freq: Every day | ORAL | Status: DC
  Filled 2017-02-12: qty 1

## 2017-02-12 MED ORDER — INSULIN ASPART 100 UNIT/ML ~~LOC~~ SOLN: 0.0000 [IU] | Freq: Three times a day (TID) | SUBCUTANEOUS | Status: DC

## 2017-02-12 MED ORDER — ACETAMINOPHEN 650 MG RE SUPP: 650.0000 mg | Freq: Four times a day (QID) | RECTAL | Status: DC | PRN

## 2017-02-12 MED ORDER — FLEET ENEMA 7-19 GM/118ML RE ENEM
1.0000 | ENEMA | Freq: Once | RECTAL | Status: AC
  Administered 2017-02-12: 1 via RECTAL
  Filled 2017-02-12: qty 1

## 2017-02-12 MED ORDER — SODIUM CHLORIDE 0.9 % IV BOLUS (SEPSIS): 500.0000 mL | Freq: Once | INTRAVENOUS | Status: DC

## 2017-02-12 MED ORDER — ONDANSETRON HCL 4 MG/2ML IJ SOLN: 4.0000 mg | Freq: Four times a day (QID) | INTRAMUSCULAR | Status: DC | PRN

## 2017-02-12 MED ORDER — SODIUM CHLORIDE 0.9 % IV SOLN
INTRAVENOUS | Status: DC
  Administered 2017-02-12: 04:00:00 via INTRAVENOUS

## 2017-02-12 NOTE — ED Notes (Signed)
Contact information:  [facility] [phone]   [place] [phone]

## 2017-02-12 NOTE — ED Notes (Signed)
[employer] at the bedside applying lumbar brace. Patient verbalized understanding of home use and care of brace as well as discharge instructions.

## 2017-02-12 NOTE — Progress Notes (Signed)
PHARMACY NOTE -  ANTIBIOTIC RENAL DOSE ADJUSTMENT   Request received for Pharmacy to assist with antibiotic renal dose adjustment.  Patient has been initiated on Ceftriaxone 1gm iv q24hr  for UTI. SCr 1.97, estimated CrCl 25N ml/min Current dosage is appropriate and need for further dosage adjustment appears unlikely at present. Will sign off at this time.  Please reconsult if a change in clinical status warrants re-evaluation of dosage.

## 2017-02-12 NOTE — ED Notes (Signed)
Per the patient she is refusing to be admitted to the hospital. Continue stating " I am going to go see my PCP tomorroow and will do whatever he request. I also cannot afford to sleep in the hospital away from or pay the high priced bill." Dr. Randal Buba was at bedside, patient encouraged to stay for pain control, constipation management, and compression fx management. Patient continued to refuse and stated she was not staying.

## 2017-02-12 NOTE — ED Provider Notes (Addendum)
2 am Case d/w Maudie Mercury who is working with Dr. Ronnald Ramp of neurosurgery.  Place in corset brace, follow up in the office in 2 weeks.  It is safe to do enema once the brace is in place.    Patient with renal insufficiency, will need to stop cipro she is on for UTi and change to keflex.    Brace placed.  As she fell on neurontin and states meds make her unstable in the face of severe constipation will avoid narcotics.    Patient instructed to wear brace 24/7 as directed by neurosurgery.  Stop cipro and start keflex and follow up with PMD and nephrology.    Strict return precautions given.    Strict return precautions given for  chest pain, dyspnea on exertion, new weakness or numbness changes in vision or speech,  Inability to tolerate liquids or food, changes in voice cough, altered mental status or any concerns. No signs of systemic illness or infection. The patient is nontoxic-appearing on exam and vital signs are within normal limits.    I have reviewed the triage vital signs and the nursing notes. Pertinent labs &imaging results that were available during my care of the patient were reviewed by me and considered in my medical decision making (see chart for details).  After history, exam, and medical workup I feel the patient has been appropriately medically screened and is safe for discharge home. Pertinent diagnoses were discussed with the patient. Patient was given return precautions.    Jahsir Rama, MD 02/12/17 [phone]

## 2017-02-12 NOTE — ED Notes (Signed)
Pt refused IV unless she is being admitted.

## 2018-02-08 ENCOUNTER — Other Ambulatory Visit: Payer: Self-pay | Admitting: Internal Medicine

## 2018-02-08 DIAGNOSIS — N19 Unspecified kidney failure: Secondary | ICD-10-CM

## 2018-02-11 ENCOUNTER — Ambulatory Visit
Admission: RE | Admit: 2018-02-11 | Discharge: 2018-02-11 | Disposition: A | Discharge status: Home / Self Care | Payer: Medicare HMO | Source: Ambulatory Visit | Attending: Internal Medicine | Admitting: Internal Medicine

## 2018-02-11 DIAGNOSIS — N19 Unspecified kidney failure: Secondary | ICD-10-CM

## 2018-05-05 ENCOUNTER — Other Ambulatory Visit (HOSPITAL_COMMUNITY): Payer: Self-pay | Admitting: Internal Medicine

## 2018-05-05 DIAGNOSIS — R0989 Other specified symptoms and signs involving the circulatory and respiratory systems: Secondary | ICD-10-CM

## 2018-05-09 ENCOUNTER — Ambulatory Visit (HOSPITAL_COMMUNITY): Admission: RE | Admit: 2018-05-09 | Payer: Medicare HMO | Source: Ambulatory Visit

## 2018-05-12 ENCOUNTER — Ambulatory Visit (HOSPITAL_COMMUNITY): Payer: Medicare HMO

## 2018-05-12 ENCOUNTER — Ambulatory Visit (HOSPITAL_COMMUNITY)
Admission: RE | Admit: 2018-05-12 | Discharge: 2018-05-12 | Disposition: A | Discharge status: Home / Self Care | Payer: Medicare HMO | Source: Ambulatory Visit | Attending: Internal Medicine | Admitting: Internal Medicine

## 2018-05-12 DIAGNOSIS — R0989 Other specified symptoms and signs involving the circulatory and respiratory systems: Secondary | ICD-10-CM

## 2018-05-12 NOTE — Progress Notes (Signed)
ABI's have been completed. Preliminary results can be found in CV Proc through chart review.   05/12/18 4:52 PM Carlos Levering RVT

## 2019-06-02 HISTORY — PX: CATARACT EXTRACTION W/ INTRAOCULAR LENS IMPLANT: SHX1309

## 2019-09-27 ENCOUNTER — Other Ambulatory Visit (HOSPITAL_COMMUNITY): Payer: Self-pay | Admitting: Respiratory Therapy

## 2019-09-27 DIAGNOSIS — R0602 Shortness of breath: Secondary | ICD-10-CM

## 2019-12-12 ENCOUNTER — Encounter: Payer: Self-pay | Admitting: Internal Medicine

## 2019-12-12 ENCOUNTER — Ambulatory Visit (INDEPENDENT_AMBULATORY_CARE_PROVIDER_SITE_OTHER): Payer: Medicare Other

## 2019-12-12 ENCOUNTER — Other Ambulatory Visit: Payer: Self-pay

## 2019-12-12 ENCOUNTER — Ambulatory Visit (INDEPENDENT_AMBULATORY_CARE_PROVIDER_SITE_OTHER): Payer: Medicare Other | Admitting: Internal Medicine

## 2019-12-12 DIAGNOSIS — R0600 Dyspnea, unspecified: Secondary | ICD-10-CM

## 2019-12-12 DIAGNOSIS — E039 Hypothyroidism, unspecified: Secondary | ICD-10-CM

## 2019-12-12 DIAGNOSIS — R0609 Other forms of dyspnea: Secondary | ICD-10-CM

## 2019-12-12 LAB — CBC WITH DIFFERENTIAL/PLATELET
Basophils Absolute: 0 10*3/uL (ref 0.0–0.1)
Basophils Relative: 0.3 % (ref 0.0–3.0)
Eosinophils Absolute: 0 10*3/uL (ref 0.0–0.7)
Eosinophils Relative: 0.3 % (ref 0.0–5.0)
HCT: 37.5 % (ref 36.0–46.0)
Hemoglobin: 12.6 g/dL (ref 12.0–15.0)
Lymphocytes Relative: 21.5 % (ref 12.0–46.0)
Lymphs Abs: 1.7 10*3/uL (ref 0.7–4.0)
MCHC: 33.5 g/dL (ref 30.0–36.0)
MCV: 89.4 fl (ref 78.0–100.0)
Monocytes Absolute: 0.6 10*3/uL (ref 0.1–1.0)
Monocytes Relative: 7.5 % (ref 3.0–12.0)
Neutro Abs: 5.7 10*3/uL (ref 1.4–7.7)
Neutrophils Relative %: 70.4 % (ref 43.0–77.0)
Platelets: 258 10*3/uL (ref 150.0–400.0)
RBC: 4.2 Mil/uL (ref 3.87–5.11)
RDW: 13.9 % (ref 11.5–15.5)
WBC: 8.1 10*3/uL (ref 4.0–10.5)

## 2019-12-12 LAB — BASIC METABOLIC PANEL
BUN: 37 mg/dL — ABNORMAL HIGH (ref 6–23)
CO2: 27 mEq/L (ref 19–32)
Calcium: 10.8 mg/dL — ABNORMAL HIGH (ref 8.4–10.5)
Chloride: 101 mEq/L (ref 96–112)
Creatinine, Ser: 2.2 mg/dL — ABNORMAL HIGH (ref 0.40–1.20)
GFR: 21.3 mL/min — ABNORMAL LOW (ref >60.00)
Glucose, Bld: 322 mg/dL — ABNORMAL HIGH (ref 70–99)
Potassium: 4.4 mEq/L (ref 3.5–5.1)
Sodium: 138 mEq/L (ref 135–145)

## 2019-12-12 LAB — BRAIN NATRIURETIC PEPTIDE: Pro B Natriuretic peptide (BNP): 201 pg/mL — ABNORMAL HIGH (ref 0.0–100.0)

## 2019-12-12 LAB — TSH: TSH: <0.01 u[IU]/mL — ABNORMAL LOW (ref 0.35–4.50)

## 2019-12-12 MED ORDER — FAMOTIDINE 20 MG PO TABS: ORAL_TABLET | ORAL | 11 refills | Status: DC

## 2019-12-12 MED ORDER — PANTOPRAZOLE SODIUM 40 MG PO TBEC: 40.0000 mg | DELAYED_RELEASE_TABLET | Freq: Every day | ORAL | 2 refills | Status: DC

## 2019-12-12 NOTE — Assessment & Plan Note (Signed)
Quit smoking 2008 -  Onset 2015 with back ground of chronic rhinitis since 1992 (w/[facility])  -  Cough p meals since march 2020  -   12/12/2019   Walked RA  2 laps @ approx 267ft each @ avg pace  stopped due to end of study, min  sob slt balance issues/ sats 98% at end  -  rx max gerd rx 12/12/2019 >>>  - TSH unmeasurable 12/12/2019 (see separate a/p)   Symptoms are markedly disproportionate to objective findings and not clear to what extent this is actually a pulmonary  problem but pt does appear to have difficult to sort out respiratory symptoms of unknown origin for which  DDX  = almost all start with A and  include Adherence, Ace Inhibitors, Acid Reflux, Active Sinus Disease, Alpha 1 Antitripsin deficiency, Anxiety masquerading as Airways dz,  ABPA,  Allergy(esp in young), Aspiration (esp in elderly), Adverse effects of meds,  Active smoking or Vaping, A bunch of PE's/clot burden (a few small clots can't cause this syndrome unless there is already severe underlying pulm or vascular dz with poor reserve),  Anemia or thyroid disorder, plus two Bs  = Bronchiectasis and Beta blocker use..and one C= CHF     Adherence is always the initial "prime suspect" and is a multilayered concern that requires a "trust but verify" approach in every patient - starting with knowing how to use medications, especially inhalers, correctly, keeping up with refills and understanding the fundamental difference between maintenance and prns vs those medications only taken for a very short course and then stopped and not refilled.  - advised on impt of med reconciliation and should return with all meds in hand using a trust but verify approach to confirm accurate Medication  Reconciliation The principal here is that until we are certain that the  patients are doing what we've asked, it makes no sense to ask them to do more.   ? Acid (or non-acid) GERD > always difficult to exclude as up to 75% of pts in some series report no assoc  GI/ Heartburn symptoms> rec max (24h)  acid suppression and diet restrictions/ reviewed and instructions given in writing.   ? Allergy > check profile, continue singulair / ok to use saba prn  I spent extra time with pt today reviewing appropriate use of albuterol for prn use on exertion with the following points: 1) saba is for relief of sob that does not improve by walking a slower pace or resting but rather if the pt does not improve after trying this first. 2) If the pt is convinced, as many are, that saba helps recover from activity faster then it's easy to tell if this is the case by re-challenging : ie stop, take the inhaler, then p 5 minutes try the exact same activity (intensity of workload) that just caused the symptoms and see if they are substantially diminished or not after saba 3) if there is an activity that reproducibly causes the symptoms, try the saba 15 min before the activity on alternate days   If in fact the saba really does help, then fine to continue to use it prn but advised may need to look closer at the maintenance regimen being used to achieve better control of airways disease with exertion.    ? Alpha one AT > send profile   ? Anemia/ thyroid dz > see hypothyroid   Lab Results  Component Value Date   HGB 12.6 12/12/2019  HGB 11.5 (L) 02/12/2017   HGB 12.3 02/11/2017    ? Anxiety/depression/ deconditioning/ cognitive decline   > usually at the bottom of this list of usual suspects but   may interfere with adherence and also interpretation of response or lack thereof to symptom management which can be quite subjective.    ?CHF > bnp intermediate may be related to hyperthyroidism > check echo if not improving.

## 2019-12-12 NOTE — Patient Instructions (Addendum)
Pantoprazole (protonix) 40 mg   Take  30-60 min before first meal of the day and Pepcid (famotidine)  20 mg one after supper  until return to office - this is the best way to tell whether stomach acid is contributing to your problem.    GERD (REFLUX)  is an extremely common cause of respiratory symptoms just like yours , many times with no obvious heartburn at all.    It can be treated with medication, but also with lifestyle changes including elevation of the head of your bed (ideally with 6 -8inch blocks under the headboard of your bed),  Smoking cessation, avoidance of late meals, excessive alcohol, and avoid fatty foods, chocolate, peppermint, colas, red wine, and acidic juices such as orange juice.  NO MINT OR MENTHOL PRODUCTS SO NO COUGH DROPS  USE SUGARLESS CANDY INSTEAD (Jolley ranchers or Stover's or Life Savers) or even ice chips will also do - the key is to swallow to prevent all throat clearing. NO OIL BASED VITAMINS - use powdered substitutes.  Avoid fish oil when coughing.    Please remember to go to the lab and x-ray department   for your tests - we will call you with the results when they are available.    Strongly recommend either the moderna or pfizer vaccines asap based on proven safety and effectiveness against even the Delta variant.  Please schedule a follow up office visit in 6-8  weeks, call sooner if needed with pfts on return and all medications including anything you take over the counter/ all sprays and inhalers even if not using regularly

## 2019-12-12 NOTE — Assessment & Plan Note (Addendum)
Lab Results  Component Value Date   TSH <0.01 Repeated and verified X2. (L) 12/12/2019    >> > listed as taking 50 mcg per day and clearly too high a dose  (assuming taking as per instructions) with chemical and some evidence of clinical hyperthyroidism on exam.   May be contributing to sense of sob at rest   >>>> rec hold x 1 one week then check with PCP on further instructions    >>> f/u here in 6 weeks    Medical decision making was a high  level of complexity in this case because of  > one chronic condition  /diagnosis requiring extra time for  H and P, chart review, counseling,   directly observing portions of ambulatory 02 saturation study/  and generating customized AVS unique to this new pt  office visit and charting.   Each maintenance medication was reviewed in detail including emphasizing most importantly the difference between maintenance and prns and under what circumstances the prns are to be triggered using an action plan format where appropriate. Please see avs for details which were reviewed in writing by both me and my nurse and patient given a written copy highlighted where appropriate with yellow highlighter for the patient's continued care at home along with an updated version of their medications.  Patient was asked to maintain medication reconciliation by comparing this list to the actual medications being used at home and to contact this office right away if there is a conflict or discrepancy.

## 2019-12-12 NOTE — Progress Notes (Signed)
Tami Richards, female    DOB: 1935/01/28,    MRN: [id number]   Brief patient profile:  13 yowf quit smoking Dec 2008 with no apparent sequelae ie good activity tolerance with onset of sob around 2015 and progressive since then assoc with pnds/ cough worse with meals so referred to pulmonary clinic 12/12/2019 by Dr   Thornell Mule.   Had previous allergy eval 1992 with rhinitis/ rash never took shots/ eval at [facility]   History of Present Illness  12/12/2019  Pulmonary/ 1st office eval/Tami Richards / has refused vaccine to date  Chief Complaint  Patient presents with  . Pulmonary Consult    Referred by Dr Charlane Ferretti. Pt c/o Dyspnea since 2016.   Dyspnea:  MMRC3 = can't walk 100 yards even at a slow pace at a flat grade s stopping due to sob  Can do an aisle or two [employer]  Cough: worse x 1.5 years esp while eating / minimal mucoid to slt beige  Sleep: nose runs esp hs but then qiets down overnight  SABA use: no better / also no better with pred Rhinitis since 1992, ? no better with singulair or pred or breztri per PCP notes   No obvious day to day or daytime variability or assoc excess/ purulent sputum or mucus plugs or hemoptysis or cp or chest tightness, subjective wheeze or overt   hb symptoms.     Also denies any obvious fluctuation of symptoms with weather or environmental changes or other aggravating or alleviating factors except as outlined above.  No unusual exposure hx or h/o childhood pna/ asthma or knowledge of premature birth.  Current Allergies, Complete Past Medical History, Past Surgical History, Family History, and Social History were reviewed in [employer] record.  ROS  The following are not active complaints unless bolded Hoarseness, sore throat, dysphagia, dental problems, itching, sneezing,  nasal congestion or discharge of excess mucus or purulent secretions, ear ache,   fever, chills, sweats, unintended wt loss or wt gain, classically pleuritic  or exertional cp,  orthopnea pnd or arm/hand swelling  or leg swelling, presyncope, palpitations, abdominal pain, anorexia, nausea, vomiting, diarrhea  or change in bowel habits or change in bladder habits, change in stools or change in urine, dysuria, hematuria,  rash, arthralgias, visual complaints, headache, numbness, weakness or ataxia or problems with walking or coordination,  change in mood or  memory.              Past Medical History:  Diagnosis Date  . Acid reflux   . Hypertension   . Kidney stones     Outpatient Medications Prior to Visit  Medication Sig Dispense Refill  . acetaminophen (TYLENOL) 500 MG tablet Take 500 mg by mouth every 6 (six) hours as needed.    Marland Kitchen aspirin EC 81 MG tablet Take 81 mg by mouth daily.    Marland Kitchen atorvastatin (LIPITOR) 10 MG tablet Take 10 mg by mouth daily.    Marland Kitchen levothyroxine (SYNTHROID, LEVOTHROID) 50 MCG tablet Take 50 mcg by mouth daily before breakfast.    . loratadine (CLARITIN) 10 MG tablet Take 10 mg by mouth daily.    . montelukast (SINGULAIR) 10 MG tablet Take 10 mg by mouth at bedtime.    . cephALEXin (KEFLEX) 500 MG capsule Take 1 capsule (500 mg total) by mouth 4 (four) times daily. 28 capsule 0  . hydrochlorothiazide (HYDRODIURIL) 25 MG tablet Take 25 mg by mouth daily.    Marland Kitchen HYDROcodone-acetaminophen (NORCO/VICODIN)  5-325 MG tablet Take 2 tablets by mouth every 4 (four) hours as needed. (Patient not taking: Reported on 02/12/2017) 10 tablet 0  . ibuprofen (ADVIL,MOTRIN) 800 MG tablet Take 1 tablet (800 mg total) by mouth 3 (three) times daily. (Patient not taking: Reported on 02/12/2017) 21 tablet 0  . lidocaine (LIDODERM) 5 % Place 1 patch onto the skin daily. Remove & Discard patch within 12 hours or as directed by MD 30 patch 0  . metFORMIN (GLUCOPHAGE) 500 MG tablet Take 500 mg by mouth 2 (two) times daily with a meal.    . predniSONE (DELTASONE) 10 MG tablet  taken in past >no better        Objective:     BP 116/60 (BP Location:  Left Arm, Cuff Size: Normal)   Pulse 100   Temp 98.3 F (36.8 C) (Oral)   Ht 5\' 6"  (1.676 m)   Wt 144 lb (65.3 kg)   SpO2 96% Comment: on RA  BMI 23.24 kg/m   SpO2: 96 % (on RA)   Very hoarse verbose amb wf nad who failed to answer a single questions asked in a straightforward manner, tending to go off on tangents or answer questions with ambiguous medical terms or diagnoses in very specific detail   HEENT : pt wearing mask not removed for exam due to covid -19 concerns.    NECK :  without JVD/Nodes/TM/ nl carotid upstrokes bilaterally   LUNGS: no acc muscle use,  Nl contour chest which is clear to A and P bilaterally without cough on insp or exp maneuvers   CV:  RRR  no s3 or murmur or increase in P2, and no edema   ABD:  soft and nontender with nl inspiratory excursion in the supine position. No bruits or organomegaly appreciated, bowel sounds nl  MS:  Nl gait/ ext warm without deformities, calf tenderness, cyanosis or clubbing No obvious joint restrictions   SKIN: warm and dry without lesions    NEURO:  alert, approp, nl sensorium with  no motor or cerebellar deficits apparent.    CXR PA and Lateral:   12/12/2019 :    I personally reviewed images  impression as follows:   Mild copd/ mild kyphosis  Labs ordered/ reviewed:      Chemistry      Component Value Date/Time   NA 138 12/12/2019 1455   K 4.4 12/12/2019 1455   CL 101 12/12/2019 1455   CO2 27 12/12/2019 1455   BUN 37 (H) 12/12/2019 1455   CREATININE 2.20 (H) 12/12/2019 1455      Component Value Date/Time   CALCIUM 10.8 (H) 12/12/2019 1455   ALKPHOS 93 02/11/2017 2350   AST 20 02/11/2017 2350   ALT 11 (L) 02/11/2017 2350   BILITOT 0.8 02/11/2017 2350        Lab Results  Component Value Date   WBC 8.1 12/12/2019   HGB 12.6 12/12/2019   HCT 37.5 12/12/2019   MCV 89.4 12/12/2019   PLT 258.0 12/12/2019       EOS                                                              0.0  12/12/2019     No results found for: DDIMER    Lab Results  Component Value Date   TSH <0.01 Repeated and verified X2. (L) 12/12/2019     Lab Results  Component Value Date   PROBNP 201.0 (H) 12/12/2019      No results found for: ESRSEDRATE   Labs ordered 12/12/2019  :  allergy profile   alpha one AT phenotype         Assessment   DOE (dyspnea on exertion) Quit smoking 2008 -  Onset 2015 with back ground of chronic rhinitis since 1992 ([facility])  -  Cough p meals since march 2020  -   12/12/2019   Walked RA  2 laps @ approx 275ft each @ avg pace  stopped due to end of study, min  sob slt balance issues/ sats 98% at end  -  rx max gerd rx 12/12/2019 >>>  - TSH unmeasurable 12/12/2019 (see separate a/p)   Symptoms are markedly disproportionate to objective findings and not clear to what extent this is actually a pulmonary  problem but pt does appear to have difficult to sort out respiratory symptoms of unknown origin for which  DDX  = almost all start with A and  include Adherence, Ace Inhibitors, Acid Reflux, Active Sinus Disease, Alpha 1 Antitripsin deficiency, Anxiety masquerading as Airways dz,  ABPA,  Allergy(esp in young), Aspiration (esp in elderly), Adverse effects of meds,  Active smoking or Vaping, A bunch of PE's/clot burden (a few small clots can't cause this syndrome unless there is already severe underlying pulm or vascular dz with poor reserve),  Anemia or thyroid disorder, plus two Bs  = Bronchiectasis and Beta blocker use..and one C= CHF     Adherence is always the initial "prime suspect" and is a multilayered concern that requires a "trust but verify" approach in every patient - starting with knowing how to use medications, especially inhalers, correctly, keeping up with refills and understanding the fundamental difference between maintenance and prns vs those medications only taken for a very short course and then stopped and not refilled.  - advised on  impt of med reconciliation and should return with all meds in hand using a trust but verify approach to confirm accurate Medication  Reconciliation The principal here is that until we are certain that the  patients are doing what we've asked, it makes no sense to ask them to do more.   ? Acid (or non-acid) GERD > always difficult to exclude as up to 75% of pts in some series report no assoc GI/ Heartburn symptoms> rec max (24h)  acid suppression and diet restrictions/ reviewed and instructions given in writing.   ? Allergy > check profile, continue singulair / ok to use saba prn  I spent extra time with pt today reviewing appropriate use of albuterol for prn use on exertion with the following points: 1) saba is for relief of sob that does not improve by walking a slower pace or resting but rather if the pt does not improve after trying this first. 2) If the pt is convinced, as many are, that saba helps recover from activity faster then it's easy to tell if this is the case by re-challenging : ie stop, take the inhaler, then p 5 minutes try the exact same activity (intensity of workload) that just caused the symptoms and see if they are substantially diminished or not after saba 3) if there is an activity that reproducibly causes  the symptoms, try the saba 15 min before the activity on alternate days   If in fact the saba really does help, then fine to continue to use it prn but advised may need to look closer at the maintenance regimen being used to achieve better control of airways disease with exertion.    ? Alpha one AT > send profile   ? Anemia/ thyroid dz > see hypothyroid   Lab Results  Component Value Date   HGB 12.6 12/12/2019   HGB 11.5 (L) 02/12/2017   HGB 12.3 02/11/2017    ? Anxiety/depression/ deconditioning/ cognitive decline   > usually at the bottom of this list of usual suspects but   may interfere with adherence and also interpretation of response or lack thereof to symptom  management which can be quite subjective.    ?CHF > bnp intermediate may be related to hyperthyroidism > check echo if not improving.       Hypothyroidism over treated at present  Lab Results  Component Value Date   TSH <0.01 Repeated and verified X2. (L) 12/12/2019    >> > listed as taking 50 mcg per day and clearly too high a dose  (assuming taking as per instructions) with chemical and some evidence of clinical hyperthyroidism on exam.   May be contributing to sense of sob at rest   >>>> rec hold x 1 one week then check with PCP on further instructions    >>> f/u here in 6 weeks     Medical decision making was ahigh  level of complexity in this case because of  > one chronic condition  /diagnosis requiring extra time for  H and P, chart review, counseling,   directly observing portions of ambulatory 02 saturation study/  and generating customized AVS unique to this office visit and charting.   Each maintenance medication was reviewed in detail including emphasizing most importantly the difference between maintenance and prns and under what circumstances the prns are to be triggered using an action plan format where appropriate. Please see avs for details which were reviewed in writing by both me and my nurse and patient given a written copy highlighted where appropriate with yellow highlighter for the patient's continued care at home along with an updated version of their medications.  Patient was asked to maintain medication reconciliation by comparing this list to the actual medications being used at home and to contact this office right away if there is a conflict or discrepancy.     Sandrea Hughs, MD 12/12/2019

## 2019-12-13 NOTE — Progress Notes (Signed)
Tried calling the pt and her line rings with no answer and no VM

## 2019-12-14 ENCOUNTER — Telehealth: Payer: Self-pay | Admitting: Internal Medicine

## 2019-12-14 NOTE — Telephone Encounter (Signed)
Called and spoke with pt letting her know the info stated by MW about how to take the famotidine and pantoprazole. Pt verbalized understanding. Nothing further needed.

## 2019-12-20 LAB — RESPIRATORY ALLERGY PROFILE REGION II ~~LOC~~

## 2019-12-20 LAB — ALPHA-1 ANTITRYPSIN PHENOTYPE: A-1 Antitrypsin, Ser: 188 mg/dL (ref 83–199)

## 2019-12-20 LAB — INTERPRETATION:

## 2020-02-02 ENCOUNTER — Telehealth: Payer: Self-pay | Admitting: Infectious Diseases

## 2020-02-02 ENCOUNTER — Ambulatory Visit: Payer: Medicare Other | Admitting: Internal Medicine

## 2020-02-02 NOTE — Telephone Encounter (Signed)
Called to Discuss with patient about Covid symptoms and the use of the monoclonal antibody infusion for those with mild to moderate Covid symptoms and at a high risk of hospitalization.     Pt appears to qualify for this infusion due to co-morbid conditions and/or a member of an at-risk group in accordance with the FDA Emergency Use Authorization.    Unable to reach pt daughter  LVM

## 2020-02-03 ENCOUNTER — Telehealth: Payer: Self-pay | Admitting: Nurse Practitioner

## 2020-02-03 ENCOUNTER — Other Ambulatory Visit: Payer: Self-pay | Admitting: Nurse Practitioner

## 2020-02-03 DIAGNOSIS — I10 Essential (primary) hypertension: Secondary | ICD-10-CM

## 2020-02-03 DIAGNOSIS — E118 Type 2 diabetes mellitus with unspecified complications: Secondary | ICD-10-CM

## 2020-02-03 DIAGNOSIS — U071 COVID-19: Secondary | ICD-10-CM

## 2020-02-03 DIAGNOSIS — R0609 Other forms of dyspnea: Secondary | ICD-10-CM

## 2020-02-03 DIAGNOSIS — N179 Acute kidney failure, unspecified: Secondary | ICD-10-CM

## 2020-02-03 NOTE — Telephone Encounter (Signed)
I connected by phone with Tami Richards on 02/03/2020 at 1:07 PM to discuss the potential use of a new treatment for mild to moderate COVID-19 viral infection in non-hospitalized patients.  This patient is a 85 y.o. female that meets the FDA criteria for Emergency Use Authorization of COVID monoclonal antibody casirivimab/imdevimab.  Has a (+) direct SARS-CoV-2 viral test result  Has mild or moderate COVID-19   Is NOT hospitalized due to COVID-19  Is within 10 days of symptom onset  Has at least one of the high risk factor(s) for progression to severe COVID-19 and/or hospitalization as defined in EUA.  Specific high risk criteria : Older age (>/= 83 yo), Diabetes, Cardiovascular disease or hypertension and Chronic Lung Disease   I have spoken and communicated the following to the patient or parent/caregiver regarding COVID monoclonal antibody treatment:  1. FDA has authorized the emergency use for the treatment of mild to moderate COVID-19 in adults and pediatric patients with positive results of direct SARS-CoV-2 viral testing who are 65 years of age and older weighing at least 40 kg, and who are at high risk for progressing to severe COVID-19 and/or hospitalization.  2. The significant known and potential risks and benefits of COVID monoclonal antibody, and the extent to which such potential risks and benefits are unknown.  3. Information on available alternative treatments and the risks and benefits of those alternatives, including clinical trials.  4. Patients treated with COVID monoclonal antibody should continue to self-isolate and use infection control measures (e.g., wear mask, isolate, social distance, avoid sharing personal items, clean and disinfect "high touch" surfaces, and frequent handwashing) according to CDC guidelines.   5. The patient or parent/caregiver has the option to accept or refuse COVID monoclonal antibody treatment.  After reviewing this information  with the patient, The patient agreed to proceed with receiving casirivimab\imdevimab infusion and will be provided a copy of the Fact sheet prior to receiving the infusion. Tami Richards. Tami Richards 02/03/2020 1:07 PM

## 2020-02-04 ENCOUNTER — Ambulatory Visit (HOSPITAL_COMMUNITY)
Admission: RE | Admit: 2020-02-04 | Discharge: 2020-02-04 | Disposition: A | Discharge status: Home / Self Care | Payer: Medicare Other | Source: Ambulatory Visit | Attending: Pulmonary Disease | Admitting: Pulmonary Disease

## 2020-02-04 DIAGNOSIS — Z23 Encounter for immunization: Secondary | ICD-10-CM | POA: Insufficient documentation

## 2020-02-04 DIAGNOSIS — N179 Acute kidney failure, unspecified: Secondary | ICD-10-CM | POA: Diagnosis present

## 2020-02-04 DIAGNOSIS — R0600 Dyspnea, unspecified: Secondary | ICD-10-CM | POA: Insufficient documentation

## 2020-02-04 DIAGNOSIS — E118 Type 2 diabetes mellitus with unspecified complications: Secondary | ICD-10-CM | POA: Diagnosis present

## 2020-02-04 DIAGNOSIS — R0609 Other forms of dyspnea: Secondary | ICD-10-CM

## 2020-02-04 DIAGNOSIS — U071 COVID-19: Secondary | ICD-10-CM

## 2020-02-04 DIAGNOSIS — I10 Essential (primary) hypertension: Secondary | ICD-10-CM

## 2020-02-04 MED ORDER — FAMOTIDINE IN NACL 20-0.9 MG/50ML-% IV SOLN: 20.0000 mg | Freq: Once | INTRAVENOUS | Status: DC | PRN

## 2020-02-04 MED ORDER — DIPHENHYDRAMINE HCL 50 MG/ML IJ SOLN: 50.0000 mg | Freq: Once | INTRAMUSCULAR | Status: DC | PRN

## 2020-02-04 MED ORDER — SODIUM CHLORIDE 0.9 % IV SOLN
1200.0000 mg | Freq: Once | INTRAVENOUS | Status: AC
  Administered 2020-02-04: 1200 mg via INTRAVENOUS
  Filled 2020-02-04: qty 10

## 2020-02-04 MED ORDER — SODIUM CHLORIDE 0.9 % IV SOLN: INTRAVENOUS | Status: DC | PRN

## 2020-02-04 MED ORDER — ALBUTEROL SULFATE HFA 108 (90 BASE) MCG/ACT IN AERS: 2.0000 | INHALATION_SPRAY | Freq: Once | RESPIRATORY_TRACT | Status: DC | PRN

## 2020-02-04 MED ORDER — METHYLPREDNISOLONE SODIUM SUCC 125 MG IJ SOLR: 125.0000 mg | Freq: Once | INTRAMUSCULAR | Status: DC | PRN

## 2020-02-04 MED ORDER — EPINEPHRINE 0.3 MG/0.3ML IJ SOAJ: 0.3000 mg | Freq: Once | INTRAMUSCULAR | Status: DC | PRN

## 2020-02-04 NOTE — Progress Notes (Signed)
  Diagnosis: COVID-19  Physician: Wright, MD  Procedure: Covid Infusion Clinic Med: casirivimab\imdevimab infusion - Provided patient with casirivimab\imdevimab fact sheet for patients, parents and caregivers prior to infusion.  Complications: No immediate complications noted.  Discharge: Discharged home   Mikaele Stecher R Jahmil Macleod 02/04/2020   

## 2020-02-04 NOTE — Progress Notes (Signed)
Upon arrival patient was hypotensive (systolic 37'J) and symptomatic. Patient reported blurry vision and feeling dizzy. Spoke with Mendel Ryder, NP who gave orders for 1L bolus. O2 saturations fluctuated between 84% to 96% on room air, while sitting. After bolus provided, patient's BP improved to 696 systolic and patient was able to ambulate and maintain an O2 saturation at 90%+. Patient reported feeling much better and MAB infusion started. NP was notified who gave go ahead for infusion.  Patient's daughter, Marzetta Board given update. Will continue to monitor and intervene as indicated.   Kayon Dozier Lorita Officer, RN

## 2020-02-04 NOTE — Discharge Instructions (Signed)

## 2020-02-05 ENCOUNTER — Inpatient Hospital Stay (HOSPITAL_COMMUNITY)
Admission: EM | Admit: 2020-02-05 | Discharge: 2020-02-19 | DRG: 987 | Disposition: A | Discharge status: Home Health | Payer: Medicare Other | Attending: Internal Medicine | Admitting: Internal Medicine

## 2020-02-05 ENCOUNTER — Other Ambulatory Visit: Payer: Self-pay

## 2020-02-05 ENCOUNTER — Emergency Department (HOSPITAL_COMMUNITY): Payer: Medicare Other

## 2020-02-05 ENCOUNTER — Inpatient Hospital Stay (HOSPITAL_COMMUNITY): Payer: Medicare Other

## 2020-02-05 ENCOUNTER — Encounter (HOSPITAL_COMMUNITY): Payer: Self-pay | Admitting: *Deleted

## 2020-02-05 DIAGNOSIS — E876 Hypokalemia: Secondary | ICD-10-CM | POA: Diagnosis not present

## 2020-02-05 DIAGNOSIS — N136 Pyonephrosis: Secondary | ICD-10-CM | POA: Diagnosis present

## 2020-02-05 DIAGNOSIS — J1282 Pneumonia due to coronavirus disease 2019: Secondary | ICD-10-CM | POA: Diagnosis present

## 2020-02-05 DIAGNOSIS — E1122 Type 2 diabetes mellitus with diabetic chronic kidney disease: Secondary | ICD-10-CM | POA: Diagnosis present

## 2020-02-05 DIAGNOSIS — E119 Type 2 diabetes mellitus without complications: Secondary | ICD-10-CM | POA: Diagnosis not present

## 2020-02-05 DIAGNOSIS — Z7984 Long term (current) use of oral hypoglycemic drugs: Secondary | ICD-10-CM | POA: Diagnosis not present

## 2020-02-05 DIAGNOSIS — E039 Hypothyroidism, unspecified: Secondary | ICD-10-CM | POA: Diagnosis present

## 2020-02-05 DIAGNOSIS — D689 Coagulation defect, unspecified: Secondary | ICD-10-CM | POA: Diagnosis not present

## 2020-02-05 DIAGNOSIS — Z79890 Hormone replacement therapy (postmenopausal): Secondary | ICD-10-CM

## 2020-02-05 DIAGNOSIS — M25552 Pain in left hip: Secondary | ICD-10-CM | POA: Diagnosis not present

## 2020-02-05 DIAGNOSIS — G9341 Metabolic encephalopathy: Secondary | ICD-10-CM | POA: Diagnosis present

## 2020-02-05 DIAGNOSIS — E860 Dehydration: Secondary | ICD-10-CM | POA: Diagnosis present

## 2020-02-05 DIAGNOSIS — I129 Hypertensive chronic kidney disease with stage 1 through stage 4 chronic kidney disease, or unspecified chronic kidney disease: Secondary | ICD-10-CM | POA: Diagnosis present

## 2020-02-05 DIAGNOSIS — I10 Essential (primary) hypertension: Secondary | ICD-10-CM | POA: Diagnosis present

## 2020-02-05 DIAGNOSIS — U071 COVID-19: Principal | ICD-10-CM | POA: Diagnosis present

## 2020-02-05 DIAGNOSIS — N184 Chronic kidney disease, stage 4 (severe): Secondary | ICD-10-CM | POA: Diagnosis present

## 2020-02-05 DIAGNOSIS — R58 Hemorrhage, not elsewhere classified: Secondary | ICD-10-CM | POA: Diagnosis not present

## 2020-02-05 DIAGNOSIS — M25559 Pain in unspecified hip: Secondary | ICD-10-CM | POA: Diagnosis present

## 2020-02-05 DIAGNOSIS — E7800 Pure hypercholesterolemia, unspecified: Secondary | ICD-10-CM | POA: Diagnosis not present

## 2020-02-05 DIAGNOSIS — K661 Hemoperitoneum: Secondary | ICD-10-CM | POA: Diagnosis present

## 2020-02-05 DIAGNOSIS — E038 Other specified hypothyroidism: Secondary | ICD-10-CM | POA: Diagnosis not present

## 2020-02-05 DIAGNOSIS — M25551 Pain in right hip: Secondary | ICD-10-CM | POA: Diagnosis not present

## 2020-02-05 DIAGNOSIS — Z66 Do not resuscitate: Secondary | ICD-10-CM | POA: Diagnosis present

## 2020-02-05 DIAGNOSIS — E11649 Type 2 diabetes mellitus with hypoglycemia without coma: Secondary | ICD-10-CM | POA: Diagnosis not present

## 2020-02-05 DIAGNOSIS — J9601 Acute respiratory failure with hypoxia: Secondary | ICD-10-CM | POA: Diagnosis present

## 2020-02-05 DIAGNOSIS — R578 Other shock: Secondary | ICD-10-CM | POA: Diagnosis not present

## 2020-02-05 DIAGNOSIS — Z7982 Long term (current) use of aspirin: Secondary | ICD-10-CM

## 2020-02-05 DIAGNOSIS — E785 Hyperlipidemia, unspecified: Secondary | ICD-10-CM | POA: Diagnosis present

## 2020-02-05 DIAGNOSIS — E1121 Type 2 diabetes mellitus with diabetic nephropathy: Secondary | ICD-10-CM | POA: Diagnosis not present

## 2020-02-05 DIAGNOSIS — R4182 Altered mental status, unspecified: Secondary | ICD-10-CM

## 2020-02-05 DIAGNOSIS — Z79899 Other long term (current) drug therapy: Secondary | ICD-10-CM | POA: Diagnosis not present

## 2020-02-05 DIAGNOSIS — E1165 Type 2 diabetes mellitus with hyperglycemia: Secondary | ICD-10-CM | POA: Diagnosis present

## 2020-02-05 DIAGNOSIS — B9620 Unspecified Escherichia coli [E. coli] as the cause of diseases classified elsewhere: Secondary | ICD-10-CM | POA: Diagnosis present

## 2020-02-05 DIAGNOSIS — Z87891 Personal history of nicotine dependence: Secondary | ICD-10-CM | POA: Diagnosis not present

## 2020-02-05 DIAGNOSIS — IMO0002 Reserved for concepts with insufficient information to code with codable children: Secondary | ICD-10-CM | POA: Diagnosis present

## 2020-02-05 DIAGNOSIS — K219 Gastro-esophageal reflux disease without esophagitis: Secondary | ICD-10-CM | POA: Diagnosis present

## 2020-02-05 DIAGNOSIS — R400 Somnolence: Secondary | ICD-10-CM | POA: Diagnosis not present

## 2020-02-05 LAB — CBC WITH DIFFERENTIAL/PLATELET
Abs Immature Granulocytes: 0.02 10*3/uL (ref 0.00–0.07)
Basophils Absolute: 0 10*3/uL (ref 0.0–0.1)
Basophils Relative: 0 %
Eosinophils Absolute: 0 10*3/uL (ref 0.0–0.5)
Eosinophils Relative: 0 %
HCT: 38.1 % (ref 36.0–46.0)
Hemoglobin: 12.5 g/dL (ref 12.0–15.0)
Immature Granulocytes: 0 %
Lymphocytes Relative: 22 %
Lymphs Abs: 1.5 10*3/uL (ref 0.7–4.0)
MCH: 29.6 pg (ref 26.0–34.0)
MCHC: 32.8 g/dL (ref 30.0–36.0)
MCV: 90.1 fL (ref 80.0–100.0)
Monocytes Absolute: 0.2 10*3/uL (ref 0.1–1.0)
Monocytes Relative: 2 %
Neutro Abs: 5.3 10*3/uL (ref 1.7–7.7)
Neutrophils Relative %: 76 %
Platelets: 148 10*3/uL — ABNORMAL LOW (ref 150–400)
RBC: 4.23 MIL/uL (ref 3.87–5.11)
RDW: 15.4 % (ref 11.5–15.5)
WBC: 6.9 10*3/uL (ref 4.0–10.5)
nRBC: 0 % (ref 0.0–0.2)

## 2020-02-05 LAB — COMPREHENSIVE METABOLIC PANEL
ALT: 25 U/L (ref 0–44)
AST: 49 U/L — ABNORMAL HIGH (ref 15–41)
Albumin: 3.1 g/dL — ABNORMAL LOW (ref 3.5–5.0)
Alkaline Phosphatase: 76 U/L (ref 38–126)
Anion gap: 10 (ref 5–15)
BUN: 31 mg/dL — ABNORMAL HIGH (ref 8–23)
CO2: 19 mmol/L — ABNORMAL LOW (ref 22–32)
Calcium: 8.1 mg/dL — ABNORMAL LOW (ref 8.9–10.3)
Chloride: 106 mmol/L (ref 98–111)
Creatinine, Ser: 2.12 mg/dL — ABNORMAL HIGH (ref 0.44–1.00)
GFR calc Af Amer: 24 mL/min — ABNORMAL LOW (ref >60)
GFR calc non Af Amer: 21 mL/min — ABNORMAL LOW (ref >60)
Glucose, Bld: 182 mg/dL — ABNORMAL HIGH (ref 70–99)
Potassium: 3.7 mmol/L (ref 3.5–5.1)
Sodium: 135 mmol/L (ref 135–145)
Total Bilirubin: 0.4 mg/dL (ref 0.3–1.2)
Total Protein: 6.3 g/dL — ABNORMAL LOW (ref 6.5–8.1)

## 2020-02-05 LAB — CBG MONITORING, ED: Glucose-Capillary: 220 mg/dL — ABNORMAL HIGH (ref 70–99)

## 2020-02-05 LAB — D-DIMER, QUANTITATIVE: D-Dimer, Quant: 1.45 ug/mL-FEU — ABNORMAL HIGH (ref 0.00–0.50)

## 2020-02-05 LAB — LACTIC ACID, PLASMA: Lactic Acid, Venous: 1.4 mmol/L (ref 0.5–1.9)

## 2020-02-05 LAB — FERRITIN: Ferritin: 931 ng/mL — ABNORMAL HIGH (ref 11–307)

## 2020-02-05 LAB — TRIGLYCERIDES: Triglycerides: 232 mg/dL — ABNORMAL HIGH (ref <150)

## 2020-02-05 LAB — C-REACTIVE PROTEIN: CRP: 10.8 mg/dL — ABNORMAL HIGH (ref <1.0)

## 2020-02-05 LAB — FIBRINOGEN: Fibrinogen: 468 mg/dL (ref 210–475)

## 2020-02-05 LAB — PROCALCITONIN: Procalcitonin: 0.61 ng/mL

## 2020-02-05 LAB — LACTATE DEHYDROGENASE: LDH: 226 U/L — ABNORMAL HIGH (ref 98–192)

## 2020-02-05 MED ORDER — LORATADINE 10 MG PO TABS
10.0000 mg | ORAL_TABLET | Freq: Every day | ORAL | Status: DC
  Administered 2020-02-05 – 2020-02-09 (×5): 10 mg via ORAL
  Filled 2020-02-05 (×6): qty 1

## 2020-02-05 MED ORDER — DEXAMETHASONE SODIUM PHOSPHATE 10 MG/ML IJ SOLN
10.0000 mg | Freq: Once | INTRAMUSCULAR | Status: AC
  Administered 2020-02-05: 10 mg via INTRAVENOUS
  Filled 2020-02-05: qty 1

## 2020-02-05 MED ORDER — IPRATROPIUM-ALBUTEROL 20-100 MCG/ACT IN AERS
1.0000 | INHALATION_SPRAY | Freq: Four times a day (QID) | RESPIRATORY_TRACT | Status: DC
  Administered 2020-02-05: 1 via RESPIRATORY_TRACT
  Filled 2020-02-05: qty 4

## 2020-02-05 MED ORDER — ACETAMINOPHEN 325 MG PO TABS
650.0000 mg | ORAL_TABLET | Freq: Four times a day (QID) | ORAL | Status: DC | PRN
  Administered 2020-02-06 – 2020-02-16 (×10): 650 mg via ORAL
  Filled 2020-02-05 (×12): qty 2

## 2020-02-05 MED ORDER — ADULT MULTIVITAMIN W/MINERALS CH
1.0000 | ORAL_TABLET | Freq: Every day | ORAL | Status: DC
  Administered 2020-02-05 – 2020-02-09 (×5): 1 via ORAL
  Filled 2020-02-05 (×6): qty 1

## 2020-02-05 MED ORDER — GUAIFENESIN-DM 100-10 MG/5ML PO SYRP
10.0000 mL | ORAL_SOLUTION | ORAL | Status: DC | PRN
  Administered 2020-02-06 – 2020-02-07 (×2): 10 mL via ORAL
  Filled 2020-02-05 (×4): qty 10

## 2020-02-05 MED ORDER — METHYLPREDNISOLONE SODIUM SUCC 40 MG IJ SOLR
32.0000 mg | Freq: Two times a day (BID) | INTRAMUSCULAR | Status: DC
  Administered 2020-02-05: 32 mg via INTRAVENOUS
  Filled 2020-02-05: qty 1

## 2020-02-05 MED ORDER — PREDNISONE 50 MG PO TABS: 50.0000 mg | ORAL_TABLET | Freq: Every day | ORAL | Status: DC

## 2020-02-05 MED ORDER — HEPARIN SODIUM (PORCINE) 5000 UNIT/ML IJ SOLN
5000.0000 [IU] | Freq: Three times a day (TID) | INTRAMUSCULAR | Status: DC
  Administered 2020-02-05 – 2020-02-09 (×11): 5000 [IU] via SUBCUTANEOUS
  Filled 2020-02-05 (×11): qty 1

## 2020-02-05 MED ORDER — LEVOTHYROXINE SODIUM 50 MCG PO TABS
50.0000 ug | ORAL_TABLET | Freq: Every day | ORAL | Status: DC
  Administered 2020-02-06 – 2020-02-19 (×10): 50 ug via ORAL
  Filled 2020-02-05 (×12): qty 1

## 2020-02-05 MED ORDER — ASPIRIN EC 81 MG PO TBEC
81.0000 mg | DELAYED_RELEASE_TABLET | Freq: Every day | ORAL | Status: DC
  Administered 2020-02-05 – 2020-02-09 (×5): 81 mg via ORAL
  Filled 2020-02-05 (×5): qty 1

## 2020-02-05 MED ORDER — MULTIVITAMINS PO CAPS: 1.0000 | ORAL_CAPSULE | Freq: Every day | ORAL | Status: DC

## 2020-02-05 MED ORDER — BARICITINIB 1 MG PO TABS
1.0000 mg | ORAL_TABLET | Freq: Every day | ORAL | Status: DC
  Administered 2020-02-05 – 2020-02-09 (×5): 1 mg via ORAL
  Filled 2020-02-05 (×7): qty 1

## 2020-02-05 MED ORDER — LINAGLIPTIN 5 MG PO TABS
5.0000 mg | ORAL_TABLET | Freq: Every day | ORAL | Status: DC
  Administered 2020-02-05 – 2020-02-09 (×5): 5 mg via ORAL
  Filled 2020-02-05 (×6): qty 1

## 2020-02-05 MED ORDER — SODIUM CHLORIDE 0.9 % IV SOLN
200.0000 mg | Freq: Once | INTRAVENOUS | Status: AC
  Administered 2020-02-05: 200 mg via INTRAVENOUS
  Filled 2020-02-05: qty 200

## 2020-02-05 MED ORDER — METOPROLOL TARTRATE 5 MG/5ML IV SOLN: 2.5000 mg | Freq: Four times a day (QID) | INTRAVENOUS | Status: DC | PRN

## 2020-02-05 MED ORDER — ATORVASTATIN CALCIUM 10 MG PO TABS
10.0000 mg | ORAL_TABLET | Freq: Every day | ORAL | Status: DC
  Administered 2020-02-06 – 2020-02-09 (×4): 10 mg via ORAL
  Filled 2020-02-05 (×4): qty 1

## 2020-02-05 MED ORDER — ENOXAPARIN SODIUM 40 MG/0.4ML ~~LOC~~ SOLN: 40.0000 mg | SUBCUTANEOUS | Status: DC

## 2020-02-05 MED ORDER — SODIUM CHLORIDE 0.9 % IV SOLN
100.0000 mg | Freq: Every day | INTRAVENOUS | Status: AC
  Administered 2020-02-06 – 2020-02-09 (×4): 100 mg via INTRAVENOUS
  Filled 2020-02-05 (×4): qty 20

## 2020-02-05 MED ORDER — MONTELUKAST SODIUM 10 MG PO TABS
10.0000 mg | ORAL_TABLET | Freq: Every day | ORAL | Status: DC
  Administered 2020-02-05 – 2020-02-18 (×13): 10 mg via ORAL
  Filled 2020-02-05 (×13): qty 1

## 2020-02-05 MED ORDER — INSULIN DETEMIR 100 UNIT/ML ~~LOC~~ SOLN
10.0000 [IU] | Freq: Two times a day (BID) | SUBCUTANEOUS | Status: DC
  Administered 2020-02-05 – 2020-02-12 (×14): 10 [IU] via SUBCUTANEOUS
  Filled 2020-02-05 (×16): qty 0.1

## 2020-02-05 MED ORDER — ALBUTEROL SULFATE HFA 108 (90 BASE) MCG/ACT IN AERS
2.0000 | INHALATION_SPRAY | Freq: Once | RESPIRATORY_TRACT | Status: AC
  Administered 2020-02-05: 2 via RESPIRATORY_TRACT
  Filled 2020-02-05: qty 6.7

## 2020-02-05 MED ORDER — PANTOPRAZOLE SODIUM 40 MG PO TBEC
40.0000 mg | DELAYED_RELEASE_TABLET | Freq: Every day | ORAL | Status: DC
  Administered 2020-02-06 – 2020-02-09 (×4): 40 mg via ORAL
  Filled 2020-02-05 (×4): qty 1

## 2020-02-05 MED ORDER — INSULIN ASPART 100 UNIT/ML ~~LOC~~ SOLN
0.0000 [IU] | Freq: Three times a day (TID) | SUBCUTANEOUS | Status: DC
  Administered 2020-02-05: 5 [IU] via SUBCUTANEOUS
  Administered 2020-02-06: 3 [IU] via SUBCUTANEOUS
  Administered 2020-02-06: 5 [IU] via SUBCUTANEOUS
  Administered 2020-02-06: 2 [IU] via SUBCUTANEOUS
  Administered 2020-02-07: 5 [IU] via SUBCUTANEOUS
  Administered 2020-02-07 – 2020-02-08 (×2): 3 [IU] via SUBCUTANEOUS
  Administered 2020-02-08: 11 [IU] via SUBCUTANEOUS
  Administered 2020-02-09: 3 [IU] via SUBCUTANEOUS
  Administered 2020-02-09: 5 [IU] via SUBCUTANEOUS
  Administered 2020-02-11: 3 [IU] via SUBCUTANEOUS
  Filled 2020-02-05: qty 0.15

## 2020-02-05 NOTE — ED Notes (Signed)
Pt placed on purewick at 50 mmHg. 

## 2020-02-05 NOTE — H&P (Addendum)
History and Physical        Hospital Admission Note Date: 02/05/2020  Patient name: QUIDA GLASSER Medical record number: [id number] Date of birth: 1935/01/14 Age: 85 y.o. Gender: female  PCP: Sueanne Margarita, DO  Patient coming from: Home Lives with: Grandsons At baseline, ambulates: Independently  Chief Complaint    Chief Complaint  Patient presents with  . Covid Positive  . Weakness      HPI:   This is a very pleasant 85 year old female who is hard of hearing and a poor historian with a past medical history of diabetes, CKD 4, hypothyroidism, hypertension and hyperlipidemia who is unvaccinated against COVID-19 who was brought in by EMS from home for change in mental status and weakness since this morning.  According to the patient's daughter, the patient began feeling ill around Tuesday of last week as well as her grandson.  She was tested for COVID-19 and found to be positive on Friday.  On 9/4 she was notified that she would be a candidate for monoclonal antibody and presented to the infusion center on 9/5.  Upon arrival she was found to be hypotensive with systolic in the 78G and symptomatic.  She reported blurry vision and feeling dizzy at the time and was given a 1 L bolus of IV fluids.  Additionally her O2 sat at the time was between 84 to 96% on room air while sitting.  Her BP improved with fluids and she was able to ambulate and maintain O2 sat at 90% after the fluids.  She received the monoclonal antibody and was discharged from the infusion center in stable condition.  According to the daughter, patient woke up this a.m. and was awake but noted to be poorly responsive per her grandson and the daughter called EMS.  She was found to have a fever of 100 1.46F and treated with Tylenol 1000 mg p.o.  Required 2 L nasal cannula.  Of note, patient's daughter believes she has  underlying COPD and has a history of tobacco use from 75 to 60 years old but this has not been diagnosed.  ED Course: ED vitals: 90 9.52F, HR 83, RR 28, BP 138/52-> 100/54-> 110/66 SPO2 83% on room air.   Notable labs: Bicarb 19, glucose 182, BUN 31, creatinine 2.12 (2.20 in July, 1.97 in 2018), AST 49, ALT 25, LDH 226, triglycerides 232, ferritin 931, CRP 10.8, lactic acid 1.4, procalcitonin 0.61, WBC 6.9, D-dimer 1.45.  CXR with scarring versus atelectasis. She was given an albuterol inhaler and dexamethasone and was tried on room air but continued to have hypoxia prompting admission.  Vitals:   02/05/20 1400 02/05/20 1500  BP: (!) 105/54 110/66  Pulse: 72 71  Resp: 19 15  Temp:    SpO2: 94% 97%     Review of Systems:  Review of Systems  Constitutional: Negative for chills and fever.  HENT: Negative.   Eyes: Negative.   Respiratory: Positive for cough and shortness of breath. Negative for wheezing.   Gastrointestinal: Negative for abdominal pain, nausea and vomiting.  Genitourinary: Negative for dysuria.  Musculoskeletal: Positive for joint pain. Negative for myalgias.       Bilateral hip pain, R >L  Skin: Negative.  Neurological: Negative for weakness.  All other systems reviewed and are negative.   Medical/Social/Family History   Past Medical History: Past Medical History:  Diagnosis Date  . Acid reflux   . COVID-19   . Hypertension   . Kidney stones     No past surgical history on file.  Medications: Prior to Admission medications   Medication Sig Start Date End Date Taking? Authorizing Provider  acetaminophen (TYLENOL) 500 MG tablet Take 500 mg by mouth every 6 (six) hours as needed.   Yes [provider]  aspirin EC 81 MG tablet Take 81 mg by mouth daily.   Yes [provider]  atorvastatin (LIPITOR) 10 MG tablet Take 10 mg by mouth daily.   Yes [provider]  famotidine (PEPCID) 20 MG tablet One after supper Patient taking  differently: Take 20 mg by mouth every evening. One after supper 12/12/19  Yes Tanda Rockers, MD  glimepiride (AMARYL) 4 MG tablet Take 4 mg by mouth daily with breakfast.   Yes [provider]  levothyroxine (SYNTHROID, LEVOTHROID) 50 MCG tablet Take 50 mcg by mouth daily before breakfast.   Yes [provider]  loratadine (CLARITIN) 10 MG tablet Take 10 mg by mouth daily.   Yes [provider]  montelukast (SINGULAIR) 10 MG tablet Take 10 mg by mouth at bedtime.   Yes [provider]  Multiple Vitamin (MULTIVITAMIN) capsule Take 1 capsule by mouth daily.   Yes [provider]  pantoprazole (PROTONIX) 40 MG tablet Take 1 tablet (40 mg total) by mouth daily. Take 30-60 min before first meal of the day 12/12/19  Yes Tanda Rockers, MD    Allergies:  No Known Allergies  Social History:  reports that she has quit smoking. She has never used smokeless tobacco. She reports that she does not drink alcohol. No history on file for drug use.  Family History: Family History  Problem Relation Age of Onset  . Hypertension Other      Objective   Physical Exam: Blood pressure 110/66, pulse 71, temperature 99.8 F (37.7 C), temperature source Oral, resp. rate 15, SpO2 97 %.  Physical Exam Vitals and nursing note reviewed.  Constitutional:      Appearance: Normal appearance.     Comments: Hard of hearing  HENT:     Head: Normocephalic and atraumatic.  Eyes:     Conjunctiva/sclera: Conjunctivae normal.  Cardiovascular:     Rate and Rhythm: Normal rate and regular rhythm.     Pulses: Normal pulses.  Pulmonary:     Effort: Pulmonary effort is normal. No respiratory distress.  Abdominal:     General: Abdomen is flat.     Palpations: Abdomen is soft.  Musculoskeletal:     Comments: Right hip 2-3/5 muscle strength with flexion, left hip with 5/5  Skin:    Coloration: Skin is not jaundiced or pale.  Neurological:     Mental Status: She is  alert. Mental status is at baseline.  Psychiatric:        Mood and Affect: Mood normal.        Behavior: Behavior normal.     LABS on Admission: I have personally reviewed all the labs and imaging below    Basic Metabolic Panel: Recent Labs  Lab 02/05/20 1128  NA 135  K 3.7  CL 106  CO2 19*  GLUCOSE 182*  BUN 31*  CREATININE 2.12*  CALCIUM 8.1*   Liver Function Tests: Recent Labs  Lab  02/05/20 1128  AST 49*  ALT 25  ALKPHOS 76  BILITOT 0.4  PROT 6.3*  ALBUMIN 3.1*   No results for input(s): LIPASE, AMYLASE in the last 168 hours. No results for input(s): AMMONIA in the last 168 hours. CBC: Recent Labs  Lab 02/05/20 1128  WBC 6.9  NEUTROABS 5.3  HGB 12.5  HCT 38.1  MCV 90.1  PLT 148*   Cardiac Enzymes: No results for input(s): CKTOTAL, CKMB, CKMBINDEX, TROPONINI in the last 168 hours. BNP: Invalid input(s): POCBNP CBG: No results for input(s): GLUCAP in the last 168 hours.  Radiological Exams on Admission:  DG Chest Port 1 View  Result Date: 02/05/2020 CLINICAL DATA:  Shortness of breath and weakness. COVID-19 positive. EXAM: PORTABLE CHEST 1 VIEW COMPARISON:  December 12, 2019 FINDINGS: Cardiomediastinal silhouette is normal. Mediastinal contours appear intact. There is no evidence of focal airspace consolidation, pleural effusion or pneumothorax. Chronic streaky airspace opacities in the left lung base likely represent scarring versus atelectasis. Osseous structures are without acute abnormality. Soft tissues are grossly normal. IMPRESSION: Chronic streaky airspace opacities in the left lung base likely represent scarring versus atelectasis. Electronically Signed   By: Fidela Salisbury M.D.   On: 02/05/2020 11:50      EKG: Independently reviewed.    A & P   Principal Problem:   Acute hypoxemic respiratory failure due to COVID-19 [facility]) Active Problems:   Hypothyroidism   Diabetes mellitus type 2, controlled ([place])   Essential hypertension   Hip  pain, bilateral   Altered mental status   1. Acute hypoxic respiratory failure secondary to COVID-19 a. Currently afebrile and hemodynamically stable. Satting low to mid 80s on room air, mid to high 90s on 3 L nasal cannula b. Tested positive on 9/3, symptoms started on 8/31 c. Procalcitonin 0.61 but without signs of bacterial infection, hold antibiotics d. Elevated inflammatory markers - trend e. Start: Solu-Medrol, remdesivir, baricitinib, inhalers, antitussives f. Incentive spirometry g. Continue home Singulair and Claritin  2. Acute metabolic encephalopathy suspected from underlying COVID-19, resolved  3. Diabetes hyperglycemia a. Check HA1C b. Hold home meds c. Start insulin per COVID-19 steroid order set with linagliptin  4. Bilateral hip pain a. Unsure chronicity b. Right hip 2-3/5 muscle strength with flexion, left hip with 5/5 c. Xray right hip d. PT  5. Hypertension, stable a. Not on medications at home b. As needed metoprolol  6. Hyperlipidemia a. Continue statin  7. Hypothyroidism a. Continue synthroid  8. CKD 4, at baseline   DVT prophylaxis: heparin   Code Status: Full Code  Diet: carb modified Family Communication: Admission, patients condition and plan of care including tests being ordered have been discussed with the patient who indicates understanding and agrees with the plan and Code Status. Patient's daughter was updated  Disposition Plan: The appropriate patient status for this patient is INPATIENT. Inpatient status is judged to be reasonable and necessary in order to provide the required intensity of service to ensure the patient's safety. The patient's presenting symptoms, physical exam findings, and initial radiographic and laboratory data in the context of their chronic comorbidities is felt to place them at high risk for further clinical deterioration. Furthermore, it is not anticipated that the patient will be medically stable for discharge from  the hospital within 2 midnights of admission. The following factors support the patient status of inpatient.   " The patient's presenting symptoms include cough, shortness of breath, . " The worrisome physical exam findings include hypoxia on  room air " The initial radiographic and laboratory data are worrisome because of elevated inflammatory markers, covid 19 positive, hyperglycemia  " The chronic co-morbidities include hypertension, diabetes, hyperlipidemia, ckd 4.   * I certify that at the point of admission it is my clinical judgment that the patient will require inpatient hospital care spanning beyond 2 midnights from the point of admission due to high intensity of service, high risk for further deterioration and high frequency of surveillance required.*   Status is: Inpatient  Remains inpatient appropriate because:IV treatments appropriate due to intensity of illness or inability to take PO and Inpatient level of care appropriate due to severity of illness   Dispo: The patient is from: Home              Anticipated d/c is to: Home              Anticipated d/c date is: 2 days              Patient currently is not medically stable to d/c.     Consultants  . None  Procedures  . None  Time Spent on Admission: 64 minutes    Harold Hedge, DO [facility] Pager [phone] 02/05/2020, 3:46 PM

## 2020-02-05 NOTE — ED Provider Notes (Signed)
[facility] DEPT Provider Note   CSN: [id number] Arrival date & time: 02/05/20  1101     History Chief Complaint  Patient presents with  . Covid Positive  . Weakness    Tami Richards is a 86 y.o. female.  FridayPatient has had a fever cough and some shortness of breath.  Patient had a positive Covid.  She complains of increasing weakness and shortness of breath  The history is provided by the patient and medical records. No language interpreter was used.  Weakness Severity:  Moderate Onset quality:  Gradual Timing:  Constant Progression:  Waxing and waning Chronicity:  New Context: not alcohol use   Relieved by:  Nothing Worsened by:  Nothing Associated symptoms: no abdominal pain, no chest pain, no cough, no diarrhea, no frequency, no headaches and no seizures   Risk factors: no anemia        Past Medical History:  Diagnosis Date  . Acid reflux   . COVID-19   . Hypertension   . Kidney stones     Patient Active Problem List   Diagnosis Date Noted  . Acute hypoxemic respiratory failure due to COVID-19 ([place]) 02/05/2020  . DOE (dyspnea on exertion) 12/12/2019  . Back pain 02/12/2017  . Closed compression fracture of L1 lumbar vertebra 02/12/2017  . ARF (acute renal failure) ([place]) 02/12/2017  . Acute lower UTI 02/12/2017  . Hypothyroidism 02/12/2017  . Diabetes mellitus type 2, controlled ([place]) 02/12/2017  . Essential hypertension 02/12/2017  . Lumbar compression fracture ([place]) 02/12/2017  . Acute cystitis without hematuria     No past surgical history on file.   OB History   No obstetric history on file.     Family History  Problem Relation Age of Onset  . Hypertension Other     Social History   Tobacco Use  . Smoking status: Former Research scientist (life sciences)  . Smokeless tobacco: Never Used  Vaping Use  . Vaping Use: Never used  Substance Use Topics  . Alcohol use: No  . Drug use: Not on file    Home  Medications Prior to Admission medications   Medication Sig Start Date End Date Taking? Authorizing Provider  acetaminophen (TYLENOL) 500 MG tablet Take 500 mg by mouth every 6 (six) hours as needed.   Yes [provider]  aspirin EC 81 MG tablet Take 81 mg by mouth daily.   Yes [provider]  atorvastatin (LIPITOR) 10 MG tablet Take 10 mg by mouth daily.   Yes [provider]  famotidine (PEPCID) 20 MG tablet One after supper Patient taking differently: Take 20 mg by mouth every evening. One after supper 12/12/19  Yes Tanda Rockers, MD  glimepiride (AMARYL) 4 MG tablet Take 4 mg by mouth daily with breakfast.   Yes [provider]  levothyroxine (SYNTHROID, LEVOTHROID) 50 MCG tablet Take 50 mcg by mouth daily before breakfast.   Yes [provider]  loratadine (CLARITIN) 10 MG tablet Take 10 mg by mouth daily.   Yes [provider]  montelukast (SINGULAIR) 10 MG tablet Take 10 mg by mouth at bedtime.   Yes [provider]  Multiple Vitamin (MULTIVITAMIN) capsule Take 1 capsule by mouth daily.   Yes [provider]  pantoprazole (PROTONIX) 40 MG tablet Take 1 tablet (40 mg total) by mouth daily. Take 30-60 min before first meal of the day 12/12/19  Yes Tanda Rockers, MD    Allergies    Patient has no  known allergies.  Review of Systems   Review of Systems  Constitutional: Negative for appetite change and fatigue.  HENT: Negative for congestion, ear discharge and sinus pressure.   Eyes: Negative for discharge.  Respiratory: Negative for cough.   Cardiovascular: Negative for chest pain.  Gastrointestinal: Negative for abdominal pain and diarrhea.  Genitourinary: Negative for frequency and hematuria.  Musculoskeletal: Negative for back pain.  Skin: Negative for rash.  Neurological: Positive for weakness. Negative for seizures and headaches.  Psychiatric/Behavioral: Negative for hallucinations.    Physical  Exam Updated Vital Signs BP (!) 105/54   Pulse 72   Temp 99.8 F (37.7 C) (Oral)   Resp 19   SpO2 94%   Physical Exam Vitals and nursing note reviewed.  Constitutional:      Appearance: She is well-developed.  HENT:     Head: Normocephalic.     Nose: Nose normal.  Eyes:     General: No scleral icterus.    Conjunctiva/sclera: Conjunctivae normal.  Neck:     Thyroid: No thyromegaly.  Cardiovascular:     Rate and Rhythm: Normal rate and regular rhythm.     Heart sounds: No murmur heard.  No friction rub. No gallop.   Pulmonary:     Breath sounds: No stridor. No wheezing or rales.  Chest:     Chest wall: No tenderness.  Abdominal:     General: There is no distension.     Tenderness: There is no abdominal tenderness. There is no rebound.  Musculoskeletal:        General: Normal range of motion.     Cervical back: Neck supple.  Lymphadenopathy:     Cervical: No cervical adenopathy.  Skin:    Findings: No erythema or rash.  Neurological:     Mental Status: She is alert and oriented to person, place, and time.     Motor: No abnormal muscle tone.     Coordination: Coordination normal.  Psychiatric:        Behavior: Behavior normal.     ED Results / Procedures / Treatments   Labs (all labs ordered are listed, but only abnormal results are displayed) Labs Reviewed  CBC WITH DIFFERENTIAL/PLATELET - Abnormal; Notable for the following components:      Result Value   Platelets 148 (*)    All other components within normal limits  COMPREHENSIVE METABOLIC PANEL - Abnormal; Notable for the following components:   CO2 19 (*)    Glucose, Bld 182 (*)    BUN 31 (*)    Creatinine, Ser 2.12 (*)    Calcium 8.1 (*)    Total Protein 6.3 (*)    Albumin 3.1 (*)    AST 49 (*)    GFR calc non Af Amer 21 (*)    GFR calc Af Amer 24 (*)    All other components within normal limits  D-DIMER, QUANTITATIVE (NOT AT [facility]) - Abnormal; Notable for the following components:   D-Dimer,  Quant 1.45 (*)    All other components within normal limits  LACTATE DEHYDROGENASE - Abnormal; Notable for the following components:   LDH 226 (*)    All other components within normal limits  FERRITIN - Abnormal; Notable for the following components:   Ferritin 931 (*)    All other components within normal limits  TRIGLYCERIDES - Abnormal; Notable for the following components:   Triglycerides 232 (*)    All other components within normal limits  C-REACTIVE PROTEIN - Abnormal; Notable for  the following components:   CRP 10.8 (*)    All other components within normal limits  CULTURE, BLOOD (ROUTINE X 2)  CULTURE, BLOOD (ROUTINE X 2)  LACTIC ACID, PLASMA  PROCALCITONIN  FIBRINOGEN  LACTIC ACID, PLASMA    EKG None  Radiology DG Chest Port 1 View  Result Date: 02/05/2020 CLINICAL DATA:  Shortness of breath and weakness. COVID-19 positive. EXAM: PORTABLE CHEST 1 VIEW COMPARISON:  December 12, 2019 FINDINGS: Cardiomediastinal silhouette is normal. Mediastinal contours appear intact. There is no evidence of focal airspace consolidation, pleural effusion or pneumothorax. Chronic streaky airspace opacities in the left lung base likely represent scarring versus atelectasis. Osseous structures are without acute abnormality. Soft tissues are grossly normal. IMPRESSION: Chronic streaky airspace opacities in the left lung base likely represent scarring versus atelectasis. Electronically Signed   By: Fidela Salisbury M.D.   On: 02/05/2020 11:50    Procedures Procedures (including critical care time)  Medications Ordered in ED Medications  albuterol (VENTOLIN HFA) 108 (90 Base) MCG/ACT inhaler 2 puff (2 puffs Inhalation Given 02/05/20 1246)  dexamethasone (DECADRON) injection 10 mg (10 mg Intravenous Given 02/05/20 1246)    ED Course  I have reviewed the triage vital signs and the nursing notes.  Pertinent labs & imaging results that were available during my care of the patient were reviewed by  me and considered in my medical decision making (see chart for details).    MDM Rules/Calculators/A&P                         Patient with COVID-19 and hypoxia she will be admitted        This patient presents to the ED for concern of sob, this involves an extensive number of treatment options, and is a complaint that carries with it a high risk of complications and morbidity.  The differential diagnosis includes COVID-19   Lab Tests:   I Ordered, reviewed, and interpreted labs, which included CBC chemistries which show elevated creatinine.. Patient elevated D-dimer  Medicines ordered:   I ordered medication steroids and albuterol for  Covid  Imaging Studies ordered:   I ordered imaging studies which included chest x-ray no acute disease  I independently visualized and interpreted imaging which showed no active disease  Additional history obtained:   Additional history obtained from records  Previous records obtained and reviewed.  Consultations Obtained:   I consulted hospitalist and discussed lab and imaging findings  Reevaluation:  After the interventions stated above, I reevaluated the patient and found no change  Critical Interventions:  .   Final Clinical Impression(s) / ED Diagnoses Final diagnoses:  [id number]    Rx / DC Orders ED Discharge Orders    None       Milton Ferguson, MD 02/05/20 1510

## 2020-02-05 NOTE — ED Triage Notes (Addendum)
BIB EMS, Covid + Tested + on Friday, generalized weakness, fever 101.6 treated with Tylenol 1000 mg po, #18 LH. Family felt as if she needs to be re evaluated. 140/90-70-90% RA then 92 % on 2 liters

## 2020-02-06 ENCOUNTER — Encounter (HOSPITAL_COMMUNITY): Payer: Self-pay | Admitting: Internal Medicine

## 2020-02-06 LAB — CBC WITH DIFFERENTIAL/PLATELET
Abs Immature Granulocytes: 0.02 10*3/uL (ref 0.00–0.07)
Basophils Absolute: 0 10*3/uL (ref 0.0–0.1)
Basophils Relative: 0 %
Eosinophils Absolute: 0 10*3/uL (ref 0.0–0.5)
Eosinophils Relative: 0 %
HCT: 37.5 % (ref 36.0–46.0)
Hemoglobin: 12.6 g/dL (ref 12.0–15.0)
Immature Granulocytes: 0 %
Lymphocytes Relative: 23 %
Lymphs Abs: 1.6 10*3/uL (ref 0.7–4.0)
MCH: 29.9 pg (ref 26.0–34.0)
MCHC: 33.6 g/dL (ref 30.0–36.0)
MCV: 88.9 fL (ref 80.0–100.0)
Monocytes Absolute: 0.1 10*3/uL (ref 0.1–1.0)
Monocytes Relative: 1 %
Neutro Abs: 5.3 10*3/uL (ref 1.7–7.7)
Neutrophils Relative %: 76 %
Platelets: 142 10*3/uL — ABNORMAL LOW (ref 150–400)
RBC: 4.22 MIL/uL (ref 3.87–5.11)
RDW: 15.2 % (ref 11.5–15.5)
WBC: 7 10*3/uL (ref 4.0–10.5)
nRBC: 0 % (ref 0.0–0.2)

## 2020-02-06 LAB — BLOOD CULTURE ID PANEL (REFLEXED) - BCID2

## 2020-02-06 LAB — COMPREHENSIVE METABOLIC PANEL
ALT: 27 U/L (ref 0–44)
AST: 48 U/L — ABNORMAL HIGH (ref 15–41)
Albumin: 4.3 g/dL (ref 3.5–5.0)
Alkaline Phosphatase: 81 U/L (ref 38–126)
Anion gap: 14 (ref 5–15)
BUN: 39 mg/dL — ABNORMAL HIGH (ref 8–23)
CO2: 19 mmol/L — ABNORMAL LOW (ref 22–32)
Calcium: 9.4 mg/dL (ref 8.9–10.3)
Chloride: 108 mmol/L (ref 98–111)
Creatinine, Ser: 1.84 mg/dL — ABNORMAL HIGH (ref 0.44–1.00)
GFR calc Af Amer: 29 mL/min — ABNORMAL LOW (ref >60)
GFR calc non Af Amer: 25 mL/min — ABNORMAL LOW (ref >60)
Glucose, Bld: 233 mg/dL — ABNORMAL HIGH (ref 70–99)
Potassium: 4.6 mmol/L (ref 3.5–5.1)
Sodium: 141 mmol/L (ref 135–145)
Total Bilirubin: 0.6 mg/dL (ref 0.3–1.2)
Total Protein: 6.4 g/dL — ABNORMAL LOW (ref 6.5–8.1)

## 2020-02-06 LAB — MAGNESIUM: Magnesium: 2.3 mg/dL (ref 1.7–2.4)

## 2020-02-06 LAB — GLUCOSE, CAPILLARY
Glucose-Capillary: 144 mg/dL — ABNORMAL HIGH (ref 70–99)
Glucose-Capillary: 135 mg/dL — ABNORMAL HIGH (ref 70–99)

## 2020-02-06 LAB — FERRITIN: Ferritin: 1000 ng/mL — ABNORMAL HIGH (ref 11–307)

## 2020-02-06 LAB — D-DIMER, QUANTITATIVE: D-Dimer, Quant: 1.88 ug/mL-FEU — ABNORMAL HIGH (ref 0.00–0.50)

## 2020-02-06 LAB — CBG MONITORING, ED
Glucose-Capillary: 183 mg/dL — ABNORMAL HIGH (ref 70–99)
Glucose-Capillary: 213 mg/dL — ABNORMAL HIGH (ref 70–99)

## 2020-02-06 LAB — C-REACTIVE PROTEIN: CRP: 12.9 mg/dL — ABNORMAL HIGH (ref <1.0)

## 2020-02-06 MED ORDER — IPRATROPIUM-ALBUTEROL 20-100 MCG/ACT IN AERS
1.0000 | INHALATION_SPRAY | Freq: Two times a day (BID) | RESPIRATORY_TRACT | Status: DC
  Administered 2020-02-06 – 2020-02-11 (×9): 1 via RESPIRATORY_TRACT
  Filled 2020-02-06: qty 4

## 2020-02-06 MED ORDER — ZINC SULFATE 220 (50 ZN) MG PO CAPS
220.0000 mg | ORAL_CAPSULE | Freq: Every day | ORAL | Status: DC
  Administered 2020-02-06 – 2020-02-09 (×4): 220 mg via ORAL
  Filled 2020-02-06 (×5): qty 1

## 2020-02-06 MED ORDER — PREDNISONE 20 MG PO TABS
50.0000 mg | ORAL_TABLET | Freq: Every day | ORAL | Status: DC
  Administered 2020-02-09: 50 mg via ORAL
  Filled 2020-02-06 (×2): qty 2

## 2020-02-06 MED ORDER — ORAL CARE MOUTH RINSE
15.0000 mL | Freq: Two times a day (BID) | OROMUCOSAL | Status: DC
  Administered 2020-02-06 – 2020-02-19 (×22): 15 mL via OROMUCOSAL

## 2020-02-06 MED ORDER — METHYLPREDNISOLONE SODIUM SUCC 125 MG IJ SOLR
60.0000 mg | Freq: Two times a day (BID) | INTRAMUSCULAR | Status: AC
  Administered 2020-02-06 – 2020-02-08 (×5): 60 mg via INTRAVENOUS
  Filled 2020-02-06 (×5): qty 2

## 2020-02-06 MED ORDER — ASCORBIC ACID 500 MG PO TABS
500.0000 mg | ORAL_TABLET | Freq: Every day | ORAL | Status: DC
  Administered 2020-02-06 – 2020-02-09 (×4): 500 mg via ORAL
  Filled 2020-02-06 (×5): qty 1

## 2020-02-06 NOTE — ED Notes (Signed)
Lunch tray delivered.

## 2020-02-06 NOTE — Progress Notes (Signed)
PROGRESS NOTE    Tami Richards  EXB:[id number] DOB: 12-02-1934 DOA: 02/05/2020 PCP: Charlane Ferretti, DO    Brief Narrative:  Tami Richards is a 85 year old female who is hard of hearing and a poor historian with a past medical history of diabetes, CKD 4, hypothyroidism, hypertension and hyperlipidemia who is unvaccinated against COVID-19 who was brought in by EMS from home for change in mental status and weakness since this morning.  According to the patient's daughter, the patient began feeling ill around Tuesday of last week as well as her grandson.  She was tested for COVID-19 and found to be positive on Friday.  On 9/4 she was notified that she would be a candidate for monoclonal antibody and presented to the infusion center on 9/5.  Upon arrival she was found to be hypotensive with systolic in the 80s and symptomatic.  She reported blurry vision and feeling dizzy at the time and was given a 1 L bolus of IV fluids.  Additionally her O2 sat at the time was between 84 to 96% on room air while sitting.  Her BP improved with fluids and she was able to ambulate and maintain O2 sat at 90% after the fluids.  She received the monoclonal antibody and was discharged from the infusion center in stable condition.    According to the daughter, patient woke up this a.m. and was awake but noted to be poorly responsive per her grandson and the daughter called EMS.  She was found to have a fever of 100.61F and treated with Tylenol 1000 mg p.o.  Required 2 L nasal cannula.  Of note, patient's daughter believes she has underlying COPD and has a history of tobacco use from 61 to 59 years old but this has not been diagnosed.  In the ED, Temp 99.8, HR 83, RR 28, BP 100/54, SPO2 83% on room air.  Bicarb 19, glucose 182, BUN 31, creatinine 2.12 (2.20 in July, 1.97 in 2018), AST 49, ALT 25, LDH 226, triglycerides 232, ferritin 931, CRP 10.8, lactic acid 1.4, procalcitonin 0.61, WBC 6.9, D-dimer 1.45.  CXR  with scarring versus atelectasis. She was given an albuterol inhaler and dexamethasone and was tried on room air but continued to have hypoxia prompting admission.   Assessment & Plan:   Principal Problem:   Acute hypoxemic respiratory failure due to COVID-19 [facility]) Active Problems:   Hypothyroidism   Diabetes mellitus type 2, controlled (HCC)   Essential hypertension   Hip pain, bilateral   Altered mental status  Acute hypoxic respiratory failure secondary to acute Covid-19 viral pneumonia during the ongoing 2020/2021 Covid 19 Pandemic - POA Patient presenting from home with encephalopathy, weakness, fatigue and dyspnea.  Patient received Regeneron on 9/5.  Patient was found to be hypoxic with SPO2 in the low 80s per EMS with associated encephalopathy. --COVID test: + PCR 02/02/2020 --CRP 10.8>12.9 --ddimer 1.45>1.88 --Remdesivir, plan 5-day course (Day #2/5) --Baricitinib 1mg  PO daily (Day #2/14) --Increase Solumedrol to 60mg  q12h for rise in CRP/ddimer --prone for 2-3hrs every 12hrs if able --Continue supplemental oxygen, titrate to maintain SPO2 greater than 92%, on 2L [place] w/ SPO2 at 92% at rest --Continue supportive care with Combivent MDI prn, vitamin C, zinc, Tylenol, antitussives  --Follow CBC, CMP, D-dimer, ferritin, and CRP daily --Continue airborne/contact isolation precautions for 3 weeks from the day of diagnosis  The treatment plan and use of medications and known side effects were discussed with patient/family. Some of the medications used are based on  case reports/anecdotal data.  All other medications being used in the management of COVID-19 based on limited study data.  Complete risks and long-term side effects are unknown, however in the best clinical judgment they seem to be of some benefit.  Patient wanted to proceed with treatment options provided.  Acute metabolic encephalopathy: Resolved [place] to acute Covid-19 viral pneumonia as above coupled with  hypotension/hypoxia.  Now resolved, patient appears to be at her normal baseline.  Type 2 diabetes mellitus On glimepiride 4 mg p.o. daily at home. --Hold oral hypoglycemics while inpatient --check hemoglobin A1c --Tradjenta 5 mg p.o. daily --Moderate insulin sliding scale for coverage --CBGs qAC/HS  Hx Essential hypertension Not on any antihypertensives at home.  Was recently hypotensive at infusion center in which she received IV fluid hydration with appropriate resolution of her hypotension likely secondary to dehydration. --Continue aspirin and statin  Hyperlipidemia: Continue atorvastatin 10 mg p.o. daily  Hypothyroidism: Continue levothyroxine 50 mcg p.o. daily  CKD stage IV Creatinine 2.12 on presentation, last creatinine 2.20 on 12/12/2019 in EMR.  At baseline. --Cr 2.12> 1.84 --Avoid nephrotoxins, renally dose all medications  Weakness, gait disturbance: --PT/OT evaluation   DVT prophylaxis: Heparin Code Status: Full code Family Communication: Updated patient extensively at bedside, updated patient's daughter Kennyth Arnold via telephone this morning  Disposition Plan:  Status is: Inpatient  Remains inpatient appropriate because:Ongoing diagnostic testing needed not appropriate for outpatient work up, Unsafe d/c plan, IV treatments appropriate due to intensity of illness or inability to take PO and Inpatient level of care appropriate due to severity of illness   Dispo: The patient is from: Home              Anticipated d/c is to: Home              Anticipated d/c date is: 1 day              Patient currently is not medically stable to d/c.   Consultants:   none  Procedures:   none  Antimicrobials:   Remdesivir 9/6>>  Baricitinib 9/6>>   Subjective: Patient seen and examined bedside, resting comfortably.  Complaining about telemetry wires and would like them removed.  Mentation appears to be now back at her baseline.  Continues on 2 L nasal cannula with SPO2  92% at rest.  No other questions or concerns at this time.  Denies headache, no dizziness, no chest pain, no palpitations, no fever/chills/night sweats, no nausea/vomiting/diarrhea, no abdominal pain.  No acute events overnight per nursing staff.  Objective: Vitals:   02/06/20 0820 02/06/20 0829 02/06/20 0930 02/06/20 1000  BP:  130/67 (!) 132/48 (!) 145/91  Pulse:  (!) 59 (!) 56 (!) 52  Resp: 18 18 19  (!) 22  Temp:  99.8 F (37.7 C)    TempSrc:  Oral    SpO2: 91% 91% 99% 95%    Intake/Output Summary (Last 24 hours) at 02/06/2020 1205 Last data filed at 02/05/2020 2002 Gross per 24 hour  Intake 250 ml  Output --  Net 250 ml   There were no vitals filed for this visit.  Examination:  General exam: Appears calm and comfortable  Respiratory system: Clear to auscultation. Respiratory effort normal.  On 2 L nasal cannula with SPO2 92% Cardiovascular system: S1 & S2 heard, RRR. No JVD, murmurs, rubs, gallops or clicks. No pedal edema. Gastrointestinal system: Abdomen is nondistended, soft and nontender. No organomegaly or masses felt. Normal bowel sounds heard. Central nervous system: Alert  and oriented. No focal neurological deficits. Extremities: Symmetric 5 x 5 power. Skin: No rashes, lesions or ulcers Psychiatry: Judgement and insight appear normal. Mood & affect appropriate.     Data Reviewed: I have personally reviewed following labs and imaging studies  CBC: Recent Labs  Lab 02/05/20 1128 02/06/20 0454  WBC 6.9 7.0  NEUTROABS 5.3 5.3  HGB 12.5 12.6  HCT 38.1 37.5  MCV 90.1 88.9  PLT 148* 142*   Basic Metabolic Panel: Recent Labs  Lab 02/05/20 1128 02/06/20 0454  NA 135 141  K 3.7 4.6  CL 106 108  CO2 19* 19*  GLUCOSE 182* 233*  BUN 31* 39*  CREATININE 2.12* 1.84*  CALCIUM 8.1* 9.4  MG  --  2.3   GFR: CrCl cannot be calculated (Unknown ideal weight.). Liver Function Tests: Recent Labs  Lab 02/05/20 1128 02/06/20 0454  AST 49* 48*  ALT 25 27   ALKPHOS 76 81  BILITOT 0.4 0.6  PROT 6.3* 6.4*  ALBUMIN 3.1* 4.3   No results for input(s): LIPASE, AMYLASE in the last 168 hours. No results for input(s): AMMONIA in the last 168 hours. Coagulation Profile: No results for input(s): INR, PROTIME in the last 168 hours. Cardiac Enzymes: No results for input(s): CKTOTAL, CKMB, CKMBINDEX, TROPONINI in the last 168 hours. BNP (last 3 results) Recent Labs    12/12/19 1455  PROBNP 201.0*   HbA1C: No results for input(s): HGBA1C in the last 72 hours. CBG: Recent Labs  Lab 02/05/20 1717 02/06/20 0808  GLUCAP 220* 183*   Lipid Profile: Recent Labs    02/05/20 1128  TRIG 232*   Thyroid Function Tests: No results for input(s): TSH, T4TOTAL, FREET4, T3FREE, THYROIDAB in the last 72 hours. Anemia Panel: Recent Labs    02/05/20 1128 02/06/20 0454  FERRITIN 931* 1,000*   Sepsis Labs: Recent Labs  Lab 02/05/20 1128  PROCALCITON 0.61  LATICACIDVEN 1.4    Recent Results (from the past 240 hour(s))  Blood Culture (routine x 2)     Status: None (Preliminary result)   Collection Time: 02/05/20 11:28 AM   Specimen: BLOOD  Result Value Ref Range Status   Specimen Description   Final    BLOOD LEFT ANTECUBITAL Performed at [facility], [address], [place], [place] [address]    Special Requests   Final    BOTTLES DRAWN AEROBIC AND ANAEROBIC Blood Culture adequate volume Performed at [facility], [address] [place], [place] [address]    Culture   Final    NO GROWTH < 24 HOURS Performed at [facility], [address] [place], [place] [address]    Report Status PENDING  Incomplete  Blood Culture (routine x 2)     Status: None (Preliminary result)   Collection Time: 02/05/20 11:33 AM   Specimen: BLOOD  Result Value Ref Range Status   Specimen Description   Final    BLOOD LEFT ANTECUBITAL Performed at [facility], [address] [place], [place] [address]    Special  Requests   Final    BOTTLES DRAWN AEROBIC AND ANAEROBIC Blood Culture adequate volume Performed at [facility], [address], [place], [place] [address]    Culture   Final    NO GROWTH < 24 HOURS Performed at [facility], [address], [place], [place] [address]    Report Status PENDING  Incomplete  Radiology Studies: DG Chest Port 1 View  Result Date: 02/05/2020 CLINICAL DATA:  Shortness of breath and weakness. COVID-19 positive. EXAM: PORTABLE CHEST 1 VIEW COMPARISON:  December 12, 2019 FINDINGS: Cardiomediastinal silhouette is normal. Mediastinal contours appear intact. There is no evidence of focal airspace consolidation, pleural effusion or pneumothorax. Chronic streaky airspace opacities in the left lung base likely represent scarring versus atelectasis. Osseous structures are without acute abnormality. Soft tissues are grossly normal. IMPRESSION: Chronic streaky airspace opacities in the left lung base likely represent scarring versus atelectasis. Electronically Signed   By: Ted Mcalpine M.D.   On: 02/05/2020 11:50   DG HIP UNILAT WITH PELVIS 1V RIGHT  Result Date: 02/05/2020 CLINICAL DATA:  RIGHT hip pain for days.  No known injury. EXAM: DG HIP (WITH OR WITHOUT PELVIS) 1V RIGHT COMPARISON:  None. FINDINGS: Osseous alignment is normal. Bone mineralization is normal. No fracture line or displaced fracture fragment is seen. No acute or suspicious osseous lesion. No degenerative change at the RIGHT hip joint. Atherosclerotic calcifications are seen along the course of the SFA. Soft tissues about the RIGHT hip are otherwise unremarkable. Pelvic catheter in place. IMPRESSION: 1. No acute findings. No degenerative change at the RIGHT hip joint. 2. Atherosclerosis. Electronically Signed   By: Bary Richard M.D.   On: 02/05/2020 16:43        Scheduled Meds: . vitamin C  500 mg Oral Daily  . aspirin EC  81 mg Oral Daily  . atorvastatin  10 mg Oral  Daily  . baricitinib  1 mg Oral Daily  . heparin  5,000 Units Subcutaneous Q8H  . insulin aspart  0-15 Units Subcutaneous TID WC  . insulin detemir  10 Units Subcutaneous BID  . Ipratropium-Albuterol  1 puff Inhalation BID  . levothyroxine  50 mcg Oral Q0600  . linagliptin  5 mg Oral Daily  . loratadine  10 mg Oral Daily  . methylPREDNISolone (SOLU-MEDROL) injection  60 mg Intravenous Q12H   Followed by  . [START ON 02/09/2020] predniSONE  50 mg Oral Q breakfast  . montelukast  10 mg Oral QHS  . multivitamin with minerals  1 tablet Oral Daily  . pantoprazole  40 mg Oral QAC breakfast  . zinc sulfate  220 mg Oral Daily   Continuous Infusions: . remdesivir 100 mg in NS 100 mL 100 mg (02/06/20 0942)     LOS: 1 day    Time spent: 39 minutes spent on chart review, discussion with nursing staff, consultants, updating family and interview/physical exam; more than 50% of that time was spent in counseling and/or coordination of care.    Alvira Philips [place], DO [facility] Available via Epic secure chat 7am-7pm After these hours, please refer to coverage provider listed on [email or web address] 02/06/2020, 12:05 PM

## 2020-02-06 NOTE — Progress Notes (Signed)
Arrived from ED via stretcher, able to slide from stretcher to bed. A&O x4, all VSS and o2 98% on 2.5Liters [place]. New purewick placed and attached to suction canister. No complaints voiced. Telebox 51 applied. Call bell in reach and shown how to use.

## 2020-02-06 NOTE — Progress Notes (Signed)
PHARMACY - PHYSICIAN COMMUNICATION CRITICAL VALUE ALERT - BLOOD CULTURE IDENTIFICATION (BCID)  Tami Richards is an 85 y.o. female who presented to [facility] on 02/05/2020 with a chief complaint of change in mental status and weakness.   Assessment: acute hypoxic respiratory failure secondary to acute COVID-19 viral pneumonia  Name of physician (or Provider) Contacted: Dr. [place]  Current antibiotics: none  Changes to prescribed antibiotics recommended:  None. Per MD, considering blood cultures 1/4 bottles with gram positive cocci in clusters as a contaminant for now.   Results for orders placed or performed during the hospital encounter of 02/05/20  Blood Culture ID Panel (Reflexed) (Collected: 02/05/2020 11:28 AM)  Result Value Ref Range   Enterococcus faecalis NOT DETECTED NOT DETECTED   Enterococcus Faecium NOT DETECTED NOT DETECTED   Listeria monocytogenes NOT DETECTED NOT DETECTED   Staphylococcus species NOT DETECTED NOT DETECTED   Staphylococcus aureus (BCID) NOT DETECTED NOT DETECTED   Staphylococcus epidermidis NOT DETECTED NOT DETECTED   Staphylococcus lugdunensis NOT DETECTED NOT DETECTED   Streptococcus species NOT DETECTED NOT DETECTED   Streptococcus agalactiae NOT DETECTED NOT DETECTED   Streptococcus pneumoniae NOT DETECTED NOT DETECTED   Streptococcus pyogenes NOT DETECTED NOT DETECTED   A.calcoaceticus-baumannii NOT DETECTED NOT DETECTED   Bacteroides fragilis NOT DETECTED NOT DETECTED   Enterobacterales NOT DETECTED NOT DETECTED   Enterobacter cloacae complex NOT DETECTED NOT DETECTED   Escherichia coli NOT DETECTED NOT DETECTED   Klebsiella aerogenes NOT DETECTED NOT DETECTED   Klebsiella oxytoca NOT DETECTED NOT DETECTED   Klebsiella pneumoniae NOT DETECTED NOT DETECTED   Proteus species NOT DETECTED NOT DETECTED   Salmonella species NOT DETECTED NOT DETECTED   Serratia marcescens NOT DETECTED NOT DETECTED   Haemophilus influenzae NOT DETECTED NOT  DETECTED   Neisseria meningitidis NOT DETECTED NOT DETECTED   Pseudomonas aeruginosa NOT DETECTED NOT DETECTED   Stenotrophomonas maltophilia NOT DETECTED NOT DETECTED   Candida albicans NOT DETECTED NOT DETECTED   Candida auris NOT DETECTED NOT DETECTED   Candida glabrata NOT DETECTED NOT DETECTED   Candida krusei NOT DETECTED NOT DETECTED   Candida parapsilosis NOT DETECTED NOT DETECTED   Candida tropicalis NOT DETECTED NOT DETECTED   Cryptococcus neoformans/gattii NOT DETECTED NOT DETECTED    Luiz Ochoa 02/06/2020  6:42 PM

## 2020-02-07 LAB — CBC WITH DIFFERENTIAL/PLATELET
Abs Immature Granulocytes: 0.03 10*3/uL (ref 0.00–0.07)
Basophils Absolute: 0 10*3/uL (ref 0.0–0.1)
Basophils Relative: 0 %
Eosinophils Absolute: 0 10*3/uL (ref 0.0–0.5)
Eosinophils Relative: 0 %
HCT: 37.7 % (ref 36.0–46.0)
Hemoglobin: 12.3 g/dL (ref 12.0–15.0)
Immature Granulocytes: 0 %
Lymphocytes Relative: 20 %
Lymphs Abs: 1.9 10*3/uL (ref 0.7–4.0)
MCH: 29.6 pg (ref 26.0–34.0)
MCHC: 32.6 g/dL (ref 30.0–36.0)
MCV: 90.6 fL (ref 80.0–100.0)
Monocytes Absolute: 0.4 10*3/uL (ref 0.1–1.0)
Monocytes Relative: 4 %
Neutro Abs: 7.5 10*3/uL (ref 1.7–7.7)
Neutrophils Relative %: 76 %
Platelets: 166 10*3/uL (ref 150–400)
RBC: 4.16 MIL/uL (ref 3.87–5.11)
RDW: 15.5 % (ref 11.5–15.5)
WBC: 9.8 10*3/uL (ref 4.0–10.5)
nRBC: 0 % (ref 0.0–0.2)

## 2020-02-07 LAB — HEMOGLOBIN A1C
Hgb A1c MFr Bld: 7.7 % — ABNORMAL HIGH (ref 4.8–5.6)
Mean Plasma Glucose: 174.29 mg/dL

## 2020-02-07 LAB — COMPREHENSIVE METABOLIC PANEL
ALT: 28 U/L (ref 0–44)
AST: 47 U/L — ABNORMAL HIGH (ref 15–41)
Albumin: 3.1 g/dL — ABNORMAL LOW (ref 3.5–5.0)
Alkaline Phosphatase: 64 U/L (ref 38–126)
Anion gap: 8 (ref 5–15)
BUN: 52 mg/dL — ABNORMAL HIGH (ref 8–23)
CO2: 21 mmol/L — ABNORMAL LOW (ref 22–32)
Calcium: 8.9 mg/dL (ref 8.9–10.3)
Chloride: 108 mmol/L (ref 98–111)
Creatinine, Ser: 2.02 mg/dL — ABNORMAL HIGH (ref 0.44–1.00)
GFR calc Af Amer: 26 mL/min — ABNORMAL LOW (ref >60)
GFR calc non Af Amer: 22 mL/min — ABNORMAL LOW (ref >60)
Glucose, Bld: 167 mg/dL — ABNORMAL HIGH (ref 70–99)
Potassium: 4.9 mmol/L (ref 3.5–5.1)
Sodium: 137 mmol/L (ref 135–145)
Total Bilirubin: 0.4 mg/dL (ref 0.3–1.2)
Total Protein: 6.2 g/dL — ABNORMAL LOW (ref 6.5–8.1)

## 2020-02-07 LAB — MAGNESIUM: Magnesium: 2.5 mg/dL — ABNORMAL HIGH (ref 1.7–2.4)

## 2020-02-07 LAB — GLUCOSE, CAPILLARY
Glucose-Capillary: 154 mg/dL — ABNORMAL HIGH (ref 70–99)
Glucose-Capillary: 129 mg/dL — ABNORMAL HIGH (ref 70–99)
Glucose-Capillary: 245 mg/dL — ABNORMAL HIGH (ref 70–99)
Glucose-Capillary: 226 mg/dL — ABNORMAL HIGH (ref 70–99)

## 2020-02-07 LAB — FERRITIN: Ferritin: 1057 ng/mL — ABNORMAL HIGH (ref 11–307)

## 2020-02-07 LAB — C-REACTIVE PROTEIN: CRP: 6.1 mg/dL — ABNORMAL HIGH (ref <1.0)

## 2020-02-07 LAB — D-DIMER, QUANTITATIVE: D-Dimer, Quant: 1.9 ug/mL-FEU — ABNORMAL HIGH (ref 0.00–0.50)

## 2020-02-07 NOTE — Evaluation (Signed)
Physical Therapy Evaluation Patient Details Name: Tami Richards MRN: [id number] DOB: 04/18/1935 Today's Date: 02/07/2020   History of Present Illness  85 year old female who is hard of hearing and a poor historian with a past medical history of diabetes, CKD 4, hypothyroidism, hypertension and hyperlipidemia who is unvaccinated against COVID-19 who was brought in by EMS from home for change in mental status and weakness. She is being treated for COVID.  Clinical Impression  On eval, pt required Min assist for mobility. She walked ~35 feet around the room. LOB x 3 requiring assist from therapist to prevent fall. Unable to get accurate O2 sat levels due to poor pulse ox readings. Pt plans to d/c home where she lives with her grandsons. Will likely need to use RW for ambulation safety until balance improves.     Follow Up Recommendations Home health PT;Supervision/Assistance - 24 hour    Equipment Recommendations  None recommended by PT    Recommendations for Other Services       Precautions / Restrictions Precautions Precautions: Fall Precaution Comments: monitor O2 Restrictions Weight Bearing Restrictions: No      Mobility  Bed Mobility Overal bed mobility: Modified Independent             General bed mobility comments: increased time.  Transfers Overall transfer level: Needs assistance   Transfers: Sit to/from Stand Sit to Stand: Min assist         General transfer comment: Assist to steady.  Ambulation/Gait Ambulation/Gait assistance: Min assist Gait Distance (Feet): 35 Feet Assistive device: 1 person hand held assist (vs "furniture walking") Gait Pattern/deviations: Step-through pattern;Decreased stride length;Narrow base of support     General Gait Details: LOB x 3. Assist required from therapist to stabilize pt and prevent fall. Poor pulse ox reading so unable to accurately assess O2 levels  Stairs            Wheelchair Mobility     Modified Rankin (Stroke Patients Only)       Balance Overall balance assessment: Needs assistance           Standing balance-Leahy Scale: Poor                               Pertinent Vitals/Pain Pain Assessment: No/denies pain    Home Living Family/patient expects to be discharged to:: Private residence Living Arrangements: Other relatives (grandsons) Available Help at Discharge: Family;Available 24 hours/day Type of Home: Apartment       Home Layout: One level Home Equipment: Walker - standard;Cane - single point;Walker - 4 wheels      Prior Function Level of Independence: Independent               Hand Dominance        Extremity/Trunk Assessment   Upper Extremity Assessment Upper Extremity Assessment: Defer to OT evaluation    Lower Extremity Assessment Lower Extremity Assessment: Generalized weakness    Cervical / Trunk Assessment Cervical / Trunk Assessment: Normal  Communication   Communication: HOH  Cognition Arousal/Alertness: Awake/alert Behavior During Therapy: WFL for tasks assessed/performed Overall Cognitive Status: Within Functional Limits for tasks assessed                                 General Comments: very pleasant and talkative      General Comments      Exercises  Assessment/Plan    PT Assessment Patient needs continued PT services  PT Problem List Decreased mobility;Decreased balance;Decreased knowledge of use of DME       PT Treatment Interventions DME instruction;Gait training;Therapeutic activities;Therapeutic exercise;Patient/family education;Balance training;Functional mobility training    PT Goals (Current goals can be found in the Care Plan section)  Acute Rehab PT Goals Patient Stated Goal: to go home PT Goal Formulation: With patient Time For Goal Achievement: 02/21/20 Potential to Achieve Goals: Good    Frequency Min 3X/week   Barriers to discharge         Co-evaluation               AM-PAC PT "6 Clicks" Mobility  Outcome Measure Help needed turning from your back to your side while in a flat bed without using bedrails?: None Help needed moving from lying on your back to sitting on the side of a flat bed without using bedrails?: None Help needed moving to and from a bed to a chair (including a wheelchair)?: A Little Help needed standing up from a chair using your arms (e.g., wheelchair or bedside chair)?: A Little Help needed to walk in hospital room?: A Little Help needed climbing 3-5 steps with a railing? : A Little 6 Click Score: 20    End of Session   Activity Tolerance: Patient tolerated treatment well Patient left: in bed;with call bell/phone within reach;with bed alarm set   PT Visit Diagnosis: Unsteadiness on feet (R26.81);Muscle weakness (generalized) (M62.81)    Time: 2703-5009 PT Time Calculation (min) (ACUTE ONLY): 34 min   Charges:   PT Evaluation $PT Eval Low Complexity: 1 Low PT Treatments $Gait Training: 8-22 mins           Doreatha Massed, PT Acute Rehabilitation  Office: [phone] Pager: [phone]

## 2020-02-07 NOTE — Progress Notes (Signed)
Tami Richards  PXT:[id number] DOB: February 13, 1935 DOA: 02/05/2020 PCP: Sueanne Margarita, DO    Brief Narrative:  85 year old female who is hard of hearing and a poor historian with a past medical history of diabetes, CKD 4,hypothyroidism,hypertension and hyperlipidemia who is unvaccinated against COVID-19 who was brought in by EMS from home for change in mental status and weakness since this morning. According to the patient's daughter, the patient began feeling ill around Tuesday of last week as well as her grandson. She was tested for COVID-19 and found to be positive on Friday. On 9/4 she was notified that she would be a candidate for monoclonal antibody and presented to the infusion center on 9/5. Upon arrival she was found to be hypotensive with systolic in the 62V and symptomatic. She reported blurry vision and feeling dizzy at the time and was given a 1 L bolus of IV fluids. Additionally her O2 sat at the time was between 84 to 96% on room air while sitting. Her BP improved with fluids and she was able to ambulate and maintain O2 sat at 90% after the fluids. She received the monoclonal antibody and was discharged from the infusion center in stable condition.   According to the daughter, patient woke up this a.m. and was awake but noted to be poorly responsive per her grandson and the daughter called EMS. She was found to have a fever of 100.61F and treated with Tylenol 1000 mg p.o. Required 2 L nasal cannula. Of note, patient's daughter believes she has underlying COPD and has a history of tobacco use from 86 to 74 years old but this has not been diagnosed.  In the ED, Temp 99.8, HR 83, RR 28, BP 100/54, SPO2 83% on room air.  Bicarb 19, glucose 182, BUN 31, creatinine 2.12 (2.20 in July, 1.97in2018), AST 49, ALT 25, LDH 226, triglycerides 232, ferritin 931, CRP 10.8, lactic acid 1.4, procalcitonin 0.61, WBC 6.9, D-dimer 1.45. CXR with scarring versus atelectasis. She was given  an albuterol inhaler and dexamethasone and was tried on room air but continued to have hypoxia prompting admission.  Significant Events:  9/6 admit   Date of Positive COVID Test:  02/02/20  Vaccination Status: NOT vaccinated   COVID-19 specific Treatment: Baricitinib 9/6 > Steroid 9/6 > Remdesivir 9/6 >  Antimicrobials:  None  DVT prophylaxis: Subcutaneous heparin  Subjective: Resting quietly in bed.  Appears comfortable.  No significant acute respiratory distress.  Denies chest pain abdominal pain nausea or vomiting.  Assessment & Plan:  COVID Pneumonia -acute hypoxic respiratory failure Received Regeneron 9/5 -continue remdesivir baricitinib and steroid -wean O2 as able  Acute metabolic encephalopathy Appears to have simply been related to acute illness and dehydration -improved  CKD stage IV Baseline creatinine appears to be approximately 2.2 -stable at present  DM2 CBG variable -follow without change for now  Essential HTN Blood pressure well controlled at this time  HLD Continue atorvastatin  Hypothyroidism Continue levothyroxine Code Status: FULL CODE Family Communication:  Status is: Inpatient  Remains inpatient appropriate because:Inpatient level of care appropriate due to severity of illness   Dispo: The patient is from: Home              Anticipated d/c is to: Home              Anticipated d/c date is: 3 days              Patient currently is not medically stable to d/c.  Consultants:  none  Objective: Blood pressure 135/74, pulse 66, temperature 98.2 F (36.8 C), resp. rate 18, SpO2 95 %.  Intake/Output Summary (Last 24 hours) at 02/07/2020 1939 Last data filed at 02/07/2020 0500 Gross per 24 hour  Intake 480 ml  Output --  Net 480 ml   There were no vitals filed for this visit.  Examination: General: No acute respiratory distress Lungs: Fine diffuse crackles without wheezing Cardiovascular: Regular rate and rhythm without murmur  gallop or rub normal S1 and S2 Abdomen: Nontender, nondistended, soft, bowel sounds positive, no rebound, no ascites, no appreciable mass Extremities: No significant cyanosis, clubbing, or edema bilateral lower extremities  CBC: Recent Labs  Lab 02/05/20 1128 02/06/20 0454 02/07/20 0405  WBC 6.9 7.0 9.8  NEUTROABS 5.3 5.3 7.5  HGB 12.5 12.6 12.3  HCT 38.1 37.5 37.7  MCV 90.1 88.9 90.6  PLT 148* 142* 428   Basic Metabolic Panel: Recent Labs  Lab 02/05/20 1128 02/06/20 0454 02/07/20 0405  NA 135 141 137  K 3.7 4.6 4.9  CL 106 108 108  CO2 19* 19* 21*  GLUCOSE 182* 233* 167*  BUN 31* 39* 52*  CREATININE 2.12* 1.84* 2.02*  CALCIUM 8.1* 9.4 8.9  MG  --  2.3 2.5*   GFR: CrCl cannot be calculated (Unknown ideal weight.).  Liver Function Tests: Recent Labs  Lab 02/05/20 1128 02/06/20 0454 02/07/20 0405  AST 49* 48* 47*  ALT 25 27 28   ALKPHOS 76 81 64  BILITOT 0.4 0.6 0.4  PROT 6.3* 6.4* 6.2*  ALBUMIN 3.1* 4.3 3.1*    HbA1C: Hgb A1c MFr Bld  Date/Time Value Ref Range Status  02/07/2020 04:05 AM 7.7 (H) 4.8 - 5.6 % Final    Comment:    (NOTE) Pre diabetes:          5.7%-6.4%  Diabetes:              >6.4%  Glycemic control for   <7.0% adults with diabetes     CBG: Recent Labs  Lab 02/06/20 1728 02/06/20 2056 02/07/20 0830 02/07/20 1218 02/07/20 1652  GLUCAP 144* 135* 154* 129* 245*    Recent Results (from the past 240 hour(s))  Blood Culture (routine x 2)     Status: None (Preliminary result)   Collection Time: 02/05/20 11:28 AM   Specimen: BLOOD  Result Value Ref Range Status   Specimen Description   Final    BLOOD LEFT ANTECUBITAL Performed at [facility], [place] [address] [place], [place] [address]    Special Requests   Final    BOTTLES DRAWN AEROBIC AND ANAEROBIC Blood Culture adequate volume Performed at [facility] [address], [place], [place] [address]    Culture  Setup Time   Final    GRAM POSITIVE  COCCI IN CLUSTERS AEROBIC BOTTLE ONLY CRITICAL RESULT CALLED TO, READ BACK BY AND VERIFIED WITH: J,GADHIA PHARMD @1811  02/06/20 EB    Culture   Final    GRAM POSITIVE COCCI IDENTIFICATION TO FOLLOW Performed at [facility], [place] [address] [place],  [address]    Report Status PENDING  Incomplete  Blood Culture ID Panel (Reflexed)     Status: None   Collection Time: 02/05/20 11:28 AM  Result Value Ref Range Status   Enterococcus faecalis NOT DETECTED NOT DETECTED Final   Enterococcus Faecium NOT DETECTED NOT DETECTED Final   Listeria monocytogenes NOT DETECTED NOT DETECTED Final   Staphylococcus species NOT DETECTED NOT DETECTED  Final   Staphylococcus aureus (BCID) NOT DETECTED NOT DETECTED Final   Staphylococcus epidermidis NOT DETECTED NOT DETECTED Final   Staphylococcus lugdunensis NOT DETECTED NOT DETECTED Final   Streptococcus species NOT DETECTED NOT DETECTED Final   Streptococcus agalactiae NOT DETECTED NOT DETECTED Final   Streptococcus pneumoniae NOT DETECTED NOT DETECTED Final   Streptococcus pyogenes NOT DETECTED NOT DETECTED Final   A.calcoaceticus-baumannii NOT DETECTED NOT DETECTED Final   Bacteroides fragilis NOT DETECTED NOT DETECTED Final   Enterobacterales NOT DETECTED NOT DETECTED Final   Enterobacter cloacae complex NOT DETECTED NOT DETECTED Final   Escherichia coli NOT DETECTED NOT DETECTED Final   Klebsiella aerogenes NOT DETECTED NOT DETECTED Final   Klebsiella oxytoca NOT DETECTED NOT DETECTED Final   Klebsiella pneumoniae NOT DETECTED NOT DETECTED Final   Proteus species NOT DETECTED NOT DETECTED Final   Salmonella species NOT DETECTED NOT DETECTED Final   Serratia marcescens NOT DETECTED NOT DETECTED Final   Haemophilus influenzae NOT DETECTED NOT DETECTED Final   Neisseria meningitidis NOT DETECTED NOT DETECTED Final   Pseudomonas aeruginosa NOT DETECTED NOT DETECTED Final   Stenotrophomonas maltophilia NOT DETECTED NOT DETECTED Final    Candida albicans NOT DETECTED NOT DETECTED Final   Candida auris NOT DETECTED NOT DETECTED Final   Candida glabrata NOT DETECTED NOT DETECTED Final   Candida krusei NOT DETECTED NOT DETECTED Final   Candida parapsilosis NOT DETECTED NOT DETECTED Final   Candida tropicalis NOT DETECTED NOT DETECTED Final   Cryptococcus neoformans/gattii NOT DETECTED NOT DETECTED Final    Comment: Performed at [facility], [address], [place], [place] [address]  Blood Culture (routine x 2)     Status: None (Preliminary result)   Collection Time: 02/05/20 11:33 AM   Specimen: BLOOD  Result Value Ref Range Status   Specimen Description   Final    BLOOD LEFT ANTECUBITAL Performed at [facility], [place] [address], [place], [place] [address]    Special Requests   Final    BOTTLES DRAWN AEROBIC AND ANAEROBIC Blood Culture adequate volume Performed at [facility] [address], [place], [place] [address]    Culture   Final    NO GROWTH 2 DAYS Performed at [facility] [address] [place], [place] [address]    Report Status PENDING  Incomplete     Scheduled Meds: . vitamin C  500 mg Oral Daily  . aspirin EC  81 mg Oral Daily  . atorvastatin  10 mg Oral Daily  . baricitinib  1 mg Oral Daily  . heparin  5,000 Units Subcutaneous Q8H  . insulin aspart  0-15 Units Subcutaneous TID WC  . insulin detemir  10 Units Subcutaneous BID  . Ipratropium-Albuterol  1 puff Inhalation BID  . levothyroxine  50 mcg Oral Q0600  . linagliptin  5 mg Oral Daily  . loratadine  10 mg Oral Daily  . mouth rinse  15 mL Mouth Rinse BID  . methylPREDNISolone (SOLU-MEDROL) injection  60 mg Intravenous Q12H   Followed by  . [START ON 02/09/2020] predniSONE  50 mg Oral Q breakfast  . montelukast  10 mg Oral QHS  . multivitamin with minerals  1 tablet Oral Daily  . pantoprazole  40 mg Oral QAC breakfast  . zinc sulfate  220 mg Oral Daily   Continuous Infusions: . remdesivir 100 mg  in NS 100 mL 100 mg (02/07/20 1053)     LOS: 2 days  Cherene Altes, MD [facility] Office  [phone] Pager - Text Page per Amion  If 7PM-7AM, please contact night-coverage per Amion 02/07/2020, 7:39 PM

## 2020-02-07 NOTE — Evaluation (Signed)
Occupational Therapy Evaluation Patient Details Name: Tami Richards MRN: [id number] DOB: [birth year] Today's Date: 02/07/2020    History of Present Illness Tami Richards is a 85 year old female who is hard of hearing and a poor historian with a past medical history of diabetes, CKD 4, hypothyroidism, hypertension and hyperlipidemia who is unvaccinated against COVID-19 who was brought in by EMS from home for change in mental status and weakness. She is being treated for COVID.   Clinical Impression   [name] is an 85 year old woman admitted to hospital with COVID and currently on 2 liters of oxygen. On evaluation she demonstrated ability to perform functional mobility, donn socks and stand at sink to perform grooming task. Patient exhibits impaired balance requiring min assist for transfers and min guard for ADLs. Patient will benefit from skilled OT services to improve deficits and safety while in hospital in order to return home at discharge. Do no expect OT needs at discharge. Patient reports she has 24/7 assistance from her two grandsons at home.    Follow Up Recommendations  No OT follow up    Equipment Recommendations  None recommended by OT    Recommendations for Other Services       Precautions / Restrictions Precautions Precautions: Fall Restrictions Weight Bearing Restrictions: No      Mobility Bed Mobility Overal bed mobility: Modified Independent             General bed mobility comments: increased time.  Transfers Overall transfer level: Needs assistance Equipment used: 1 person hand held assist Transfers: Sit to/from [employer] Sit to Stand: Min assist Stand pivot transfers: Min assist            Balance Overall balance assessment: Needs assistance Sitting-balance support: No upper extremity supported;Feet supported Sitting balance-Leahy Scale: Good     Standing balance support: Single  extremity supported Standing balance-Leahy Scale: Poor Standing balance comment: Required external support to maintain balance                           ADL either performed or assessed with clinical judgement   ADL Overall ADL's : Needs assistance/impaired Eating/Feeding: Independent   Grooming: Min guard;Standing;Wash/dry hands;Wash/dry face;Oral care Grooming Details (indicate cue type and reason): performed standing at the sink. Upper Body Bathing: Set up;Sitting   Lower Body Bathing: Set up;Sit to/from stand;Min guard   Upper Body Dressing : Set up;Sitting   Lower Body Dressing: Set up;Sit to/from stand;Min guard   Toilet Transfer: Min guard;BSC;Stand-pivot   Toileting- Architect and Hygiene: Min guard;Sit to/from stand       Functional mobility during ADLs: Min guard       Vision Patient Visual Report: No change from baseline       Perception     Praxis      Pertinent Vitals/Pain Pain Assessment: No/denies pain     Hand Dominance     Extremity/Trunk Assessment Upper Extremity Assessment Upper Extremity Assessment: Overall WFL for tasks assessed   Lower Extremity Assessment Lower Extremity Assessment: Defer to PT evaluation   Cervical / Trunk Assessment Cervical / Trunk Assessment: Normal   Communication Communication Communication: No difficulties   Cognition Arousal/Alertness: Awake/alert Behavior During Therapy: WFL for tasks assessed/performed Overall Cognitive Status: Within Functional Limits for tasks assessed  General Comments       Exercises     Shoulder Instructions      Home Living Family/patient expects to be discharged to:: Private residence Living Arrangements: Children (2 grandsons) Available Help at Discharge: Family;Available 24 hours/day Type of Home: Apartment       Home Layout: One level     Bathroom Shower/Tub: Contractor: Standard     Home Equipment: Walker - standard;Cane - single point;Walker - 4 wheels          Prior Functioning/Environment Level of Independence: Independent                 OT Problem List: Impaired balance (sitting and/or standing);Cardiopulmonary status limiting activity      OT Treatment/Interventions: Self-care/ADL training;DME and/or AE instruction;Therapeutic activities;Patient/family education;Balance training    OT Goals(Current goals can be found in the care plan section) Acute Rehab OT Goals Patient Stated Goal: to go home OT Goal Formulation: With patient Time For Goal Achievement: 02/21/20 Potential to Achieve Goals: Good  OT Frequency: Min 2X/week   Barriers to D/C:            Co-evaluation              AM-PAC OT "6 Clicks" Daily Activity     Outcome Measure Help from another person eating meals?: None Help from another person taking care of personal grooming?: A Little Help from another person toileting, which includes using toliet, bedpan, or urinal?: A Little Help from another person bathing (including washing, rinsing, drying)?: A Little Help from another person to put on and taking off regular upper body clothing?: A Little Help from another person to put on and taking off regular lower body clothing?: A Little 6 Click Score: 19   End of Session Equipment Utilized During Treatment: Oxygen Nurse Communication: Mobility status  Activity Tolerance: Patient tolerated treatment well Patient left: with nursing/sitter in room (seated at side of bed with Nurse Tech)  OT Visit Diagnosis: Unsteadiness on feet (R26.81)                Time: 1610-9604 OT Time Calculation (min): 22 min Charges:  OT General Charges $OT Visit: 1 Visit OT Evaluation $OT Eval Low Complexity: 1 Low  Levester Waldridge, OTR/L Acute Care Rehab Services  Office [phone] Pager: [phone]   Kelli Churn 02/07/2020, 12:21 PM

## 2020-02-08 LAB — CBC WITH DIFFERENTIAL/PLATELET
Abs Immature Granulocytes: 0.03 10*3/uL (ref 0.00–0.07)
Basophils Absolute: 0 10*3/uL (ref 0.0–0.1)
Basophils Relative: 0 %
Eosinophils Absolute: 0 10*3/uL (ref 0.0–0.5)
Eosinophils Relative: 0 %
HCT: 37 % (ref 36.0–46.0)
Hemoglobin: 12 g/dL (ref 12.0–15.0)
Immature Granulocytes: 0 %
Lymphocytes Relative: 14 %
Lymphs Abs: 1.3 10*3/uL (ref 0.7–4.0)
MCH: 29.7 pg (ref 26.0–34.0)
MCHC: 32.4 g/dL (ref 30.0–36.0)
MCV: 91.6 fL (ref 80.0–100.0)
Monocytes Absolute: 0.3 10*3/uL (ref 0.1–1.0)
Monocytes Relative: 4 %
Neutro Abs: 7.3 10*3/uL (ref 1.7–7.7)
Neutrophils Relative %: 82 %
Platelets: 177 10*3/uL (ref 150–400)
RBC: 4.04 MIL/uL (ref 3.87–5.11)
RDW: 15.5 % (ref 11.5–15.5)
WBC: 9 10*3/uL (ref 4.0–10.5)
nRBC: 0 % (ref 0.0–0.2)

## 2020-02-08 LAB — COMPREHENSIVE METABOLIC PANEL
ALT: 31 U/L (ref 0–44)
AST: 51 U/L — ABNORMAL HIGH (ref 15–41)
Albumin: 3.1 g/dL — ABNORMAL LOW (ref 3.5–5.0)
Alkaline Phosphatase: 66 U/L (ref 38–126)
Anion gap: 7 (ref 5–15)
BUN: 62 mg/dL — ABNORMAL HIGH (ref 8–23)
CO2: 20 mmol/L — ABNORMAL LOW (ref 22–32)
Calcium: 8.8 mg/dL — ABNORMAL LOW (ref 8.9–10.3)
Chloride: 109 mmol/L (ref 98–111)
Creatinine, Ser: 2.04 mg/dL — ABNORMAL HIGH (ref 0.44–1.00)
GFR calc Af Amer: 25 mL/min — ABNORMAL LOW (ref >60)
GFR calc non Af Amer: 22 mL/min — ABNORMAL LOW (ref >60)
Glucose, Bld: 197 mg/dL — ABNORMAL HIGH (ref 70–99)
Potassium: 4.8 mmol/L (ref 3.5–5.1)
Sodium: 136 mmol/L (ref 135–145)
Total Bilirubin: 0.4 mg/dL (ref 0.3–1.2)
Total Protein: 6.2 g/dL — ABNORMAL LOW (ref 6.5–8.1)

## 2020-02-08 LAB — MAGNESIUM: Magnesium: 2.6 mg/dL — ABNORMAL HIGH (ref 1.7–2.4)

## 2020-02-08 LAB — GLUCOSE, CAPILLARY
Glucose-Capillary: 190 mg/dL — ABNORMAL HIGH (ref 70–99)
Glucose-Capillary: 310 mg/dL — ABNORMAL HIGH (ref 70–99)
Glucose-Capillary: 83 mg/dL (ref 70–99)
Glucose-Capillary: 179 mg/dL — ABNORMAL HIGH (ref 70–99)

## 2020-02-08 LAB — CULTURE, BLOOD (ROUTINE X 2): Special Requests: ADEQUATE

## 2020-02-08 LAB — FERRITIN: Ferritin: 956 ng/mL — ABNORMAL HIGH (ref 11–307)

## 2020-02-08 LAB — D-DIMER, QUANTITATIVE: D-Dimer, Quant: 1.81 ug/mL-FEU — ABNORMAL HIGH (ref 0.00–0.50)

## 2020-02-08 LAB — C-REACTIVE PROTEIN: CRP: 2.8 mg/dL — ABNORMAL HIGH (ref <1.0)

## 2020-02-08 NOTE — Progress Notes (Signed)
Tami Richards  OZD:[id number] DOB: June 24, 1934 DOA: 02/05/2020 PCP: Sueanne Margarita, DO    Brief Narrative:  85 year old with a history of HOH, DM 2, CKD 4, hypothyroidism, HTN, and HLD who is unvaccinated against CoViD who was brought to the ED via EMS due to mental status changes and severe profound weakness.  According to the family the patient had been sick for approximately 1 week. She was tested 9/3 and found to be positive for Covid.  She received Regeneron infusion 9/5.  When she presented for the infusion she was found to be hypotensive with systolics in the 25Z.  She was treated with a bolus of fluid.  Oxygen saturations at that time varied between 84 to 96% on room air.  After volume resuscitation she had stabilized and was able to be discharged home.  After returning home unfortunately she continued to decline.  She developed low-grade fevers.  When she became confused the family brought her back to the ED.  Significant Events:  9/3 + CoViD testing 9/5 mAb infusion  9/6 admit   Date of Positive COVID Test:  02/02/20  Vaccination Status: NOT vaccinated   COVID-19 specific Treatment: Baricitinib 9/6 > Steroid 9/6 > Remdesivir 9/6 >  Antimicrobials:  None  DVT prophylaxis: Subcutaneous heparin  Subjective: VSS - saturation stable in mid-90s on 2L [place] O2. CBG quite variable.   Assessment & Plan:  COVID Pneumonia -acute hypoxic respiratory failure Received Regeneron 9/5 -continue remdesivir baricitinib and steroid -wean O2 as able - crp falling -PT/OT evaluations  Recent Labs  Lab 02/05/20 1128 02/06/20 0454 02/07/20 0405 02/08/20 0405  DDIMER 1.45* 1.88* 1.90* 1.81*  FERRITIN 931* 1,000* 1,057* 956*  CRP 10.8* 12.9* 6.1* 2.8*  ALT 25 27 28 31   PROCALCITON 0.61  --   --   --     Acute metabolic encephalopathy Appears to have simply been related to acute illness and dehydration - improved  CKD stage IV Baseline creatinine appears to be approximately  2.2 -stable at present  DM2 CBG variable -follow without change for now -may require adjustment soon  Essential HTN Blood pressure controlled at this time  HLD Continue atorvastatin  Hypothyroidism Continue levothyroxine  Code Status: FULL CODE Family Communication:  Status is: Inpatient  Remains inpatient appropriate because:Inpatient level of care appropriate due to severity of illness   Dispo: The patient is from: Home              Anticipated d/c is to: Home              Anticipated d/c date is: 3 days              Patient currently is not medically stable to d/c.  Consultants:  none  Objective: Blood pressure 117/65, pulse 70, temperature 97.7 F (36.5 C), resp. rate 18, SpO2 95 %.  Intake/Output Summary (Last 24 hours) at 02/08/2020 1353 Last data filed at 02/08/2020 0500 Gross per 24 hour  Intake 240 ml  Output 550 ml  Net -310 ml   There were no vitals filed for this visit.  Examination: General: No acute respiratory distress -alert and conversant Lungs: Fine diffuse crackles -no wheezing Cardiovascular: RRR without murmur or rub Abdomen: NT/ND, soft, BS positive Extremities: No significant edema or cyanosis bilateral lower extremities  CBC: Recent Labs  Lab 02/06/20 0454 02/07/20 0405 02/08/20 0405  WBC 7.0 9.8 9.0  NEUTROABS 5.3 7.5 7.3  HGB 12.6 12.3 12.0  HCT 37.5 37.7 37.0  MCV 88.9 90.6 91.6  PLT 142* 166 616   Basic Metabolic Panel: Recent Labs  Lab 02/06/20 0454 02/07/20 0405 02/08/20 0405  NA 141 137 136  K 4.6 4.9 4.8  CL 108 108 109  CO2 19* 21* 20*  GLUCOSE 233* 167* 197*  BUN 39* 52* 62*  CREATININE 1.84* 2.02* 2.04*  CALCIUM 9.4 8.9 8.8*  MG 2.3 2.5* 2.6*   GFR: CrCl cannot be calculated (Unknown ideal weight.).  Liver Function Tests: Recent Labs  Lab 02/05/20 1128 02/06/20 0454 02/07/20 0405 02/08/20 0405  AST 49* 48* 47* 51*  ALT 25 27 28 31   ALKPHOS 76 81 64 66  BILITOT 0.4 0.6 0.4 0.4  PROT 6.3* 6.4*  6.2* 6.2*  ALBUMIN 3.1* 4.3 3.1* 3.1*    HbA1C: Hgb A1c MFr Bld  Date/Time Value Ref Range Status  02/07/2020 04:05 AM 7.7 (H) 4.8 - 5.6 % Final    Comment:    (NOTE) Pre diabetes:          5.7%-6.4%  Diabetes:              >6.4%  Glycemic control for   <7.0% adults with diabetes     CBG: Recent Labs  Lab 02/07/20 1218 02/07/20 1652 02/07/20 2059 02/08/20 0737 02/08/20 1152  GLUCAP 129* 245* 226* 190* 310*    Recent Results (from the past 240 hour(s))  Blood Culture (routine x 2)     Status: Abnormal   Collection Time: 02/05/20 11:28 AM   Specimen: BLOOD  Result Value Ref Range Status   Specimen Description   Final    BLOOD LEFT ANTECUBITAL Performed at [facility], [place] [address] [place], [place] [address]    Special Requests   Final    BOTTLES DRAWN AEROBIC AND ANAEROBIC Blood Culture adequate volume Performed at [place] [address] [place], [place] [address]    Culture  Setup Time   Final    GRAM POSITIVE COCCI IN CLUSTERS AEROBIC BOTTLE ONLY CRITICAL RESULT CALLED TO, READ BACK BY AND VERIFIED WITH: J,GADHIA PHARMD @1811  02/06/20 EB    Culture (A)  Final    ROTHIA MUCILAGINOSA THE SIGNIFICANCE OF ISOLATING THIS ORGANISM FROM A SINGLE SET OF BLOOD CULTURES WHEN MULTIPLE SETS ARE DRAWN IS UNCERTAIN. PLEASE NOTIFY THE MICROBIOLOGY DEPARTMENT WITHIN ONE WEEK IF SPECIATION AND SENSITIVITIES ARE REQUIRED. Performed at [facility], [place] [address] [place], [place] [address]    Report Status 02/08/2020 FINAL  Final  Blood Culture ID Panel (Reflexed)     Status: None   Collection Time: 02/05/20 11:28 AM  Result Value Ref Range Status   Enterococcus faecalis NOT DETECTED NOT DETECTED Final   Enterococcus Faecium NOT DETECTED NOT DETECTED Final   Listeria monocytogenes NOT DETECTED NOT DETECTED Final   Staphylococcus species NOT DETECTED NOT DETECTED Final   Staphylococcus aureus (BCID) NOT DETECTED NOT DETECTED Final    Staphylococcus epidermidis NOT DETECTED NOT DETECTED Final   Staphylococcus lugdunensis NOT DETECTED NOT DETECTED Final   Streptococcus species NOT DETECTED NOT DETECTED Final   Streptococcus agalactiae NOT DETECTED NOT DETECTED Final   Streptococcus pneumoniae NOT DETECTED NOT DETECTED Final   Streptococcus pyogenes NOT DETECTED NOT DETECTED Final   A.calcoaceticus-baumannii NOT DETECTED NOT DETECTED Final   Bacteroides fragilis NOT DETECTED NOT DETECTED Final   Enterobacterales NOT DETECTED NOT DETECTED Final   Enterobacter cloacae complex NOT DETECTED NOT DETECTED Final   Escherichia coli NOT DETECTED NOT DETECTED  Final   Klebsiella aerogenes NOT DETECTED NOT DETECTED Final   Klebsiella oxytoca NOT DETECTED NOT DETECTED Final   Klebsiella pneumoniae NOT DETECTED NOT DETECTED Final   Proteus species NOT DETECTED NOT DETECTED Final   Salmonella species NOT DETECTED NOT DETECTED Final   Serratia marcescens NOT DETECTED NOT DETECTED Final   Haemophilus influenzae NOT DETECTED NOT DETECTED Final   Neisseria meningitidis NOT DETECTED NOT DETECTED Final   Pseudomonas aeruginosa NOT DETECTED NOT DETECTED Final   Stenotrophomonas maltophilia NOT DETECTED NOT DETECTED Final   Candida albicans NOT DETECTED NOT DETECTED Final   Candida auris NOT DETECTED NOT DETECTED Final   Candida glabrata NOT DETECTED NOT DETECTED Final   Candida krusei NOT DETECTED NOT DETECTED Final   Candida parapsilosis NOT DETECTED NOT DETECTED Final   Candida tropicalis NOT DETECTED NOT DETECTED Final   Cryptococcus neoformans/gattii NOT DETECTED NOT DETECTED Final    Comment: Performed at [facility], [place] [address], [place], [place] [address]  Blood Culture (routine x 2)     Status: None (Preliminary result)   Collection Time: 02/05/20 11:33 AM   Specimen: BLOOD  Result Value Ref Range Status   Specimen Description   Final    BLOOD LEFT ANTECUBITAL Performed at [facility], [place] [address],  [place], [place] [address]    Special Requests   Final    BOTTLES DRAWN AEROBIC AND ANAEROBIC Blood Culture adequate volume Performed at [place] [address], [place], [place] [address]    Culture   Final    NO GROWTH 3 DAYS Performed at [facility], [place] [address] [place], [place] [address]    Report Status PENDING  Incomplete     Scheduled Meds: . vitamin C  500 mg Oral Daily  . aspirin EC  81 mg Oral Daily  . atorvastatin  10 mg Oral Daily  . baricitinib  1 mg Oral Daily  . heparin  5,000 Units Subcutaneous Q8H  . insulin aspart  0-15 Units Subcutaneous TID WC  . insulin detemir  10 Units Subcutaneous BID  . Ipratropium-Albuterol  1 puff Inhalation BID  . levothyroxine  50 mcg Oral Q0600  . linagliptin  5 mg Oral Daily  . loratadine  10 mg Oral Daily  . mouth rinse  15 mL Mouth Rinse BID  . montelukast  10 mg Oral QHS  . multivitamin with minerals  1 tablet Oral Daily  . pantoprazole  40 mg Oral QAC breakfast  . [START ON 02/09/2020] predniSONE  50 mg Oral Q breakfast  . zinc sulfate  220 mg Oral Daily   Continuous Infusions: . remdesivir 100 mg in NS 100 mL 100 mg (02/08/20 0941)     LOS: 3 days   Cherene Altes, MD [facility] Office  [phone] Pager - Text Page per Shea Evans  If 7PM-7AM, please contact night-coverage per Amion 02/08/2020, 1:53 PM

## 2020-02-09 LAB — CBC WITH DIFFERENTIAL/PLATELET
Abs Immature Granulocytes: 0.09 10*3/uL — ABNORMAL HIGH (ref 0.00–0.07)
Basophils Absolute: 0 10*3/uL (ref 0.0–0.1)
Basophils Relative: 0 %
Eosinophils Absolute: 0 10*3/uL (ref 0.0–0.5)
Eosinophils Relative: 0 %
HCT: 30.9 % — ABNORMAL LOW (ref 36.0–46.0)
Hemoglobin: 9.9 g/dL — ABNORMAL LOW (ref 12.0–15.0)
Immature Granulocytes: 1 %
Lymphocytes Relative: 15 %
Lymphs Abs: 1.7 10*3/uL (ref 0.7–4.0)
MCH: 29.7 pg (ref 26.0–34.0)
MCHC: 32 g/dL (ref 30.0–36.0)
MCV: 92.8 fL (ref 80.0–100.0)
Monocytes Absolute: 1 10*3/uL (ref 0.1–1.0)
Monocytes Relative: 9 %
Neutro Abs: 8.3 10*3/uL — ABNORMAL HIGH (ref 1.7–7.7)
Neutrophils Relative %: 75 %
Platelets: 203 10*3/uL (ref 150–400)
RBC: 3.33 MIL/uL — ABNORMAL LOW (ref 3.87–5.11)
RDW: 15.6 % — ABNORMAL HIGH (ref 11.5–15.5)
WBC: 11.1 10*3/uL — ABNORMAL HIGH (ref 4.0–10.5)
nRBC: 0 % (ref 0.0–0.2)

## 2020-02-09 LAB — CBC
HCT: 23.9 % — ABNORMAL LOW (ref 36.0–46.0)
HCT: 18.4 % — ABNORMAL LOW (ref 36.0–46.0)
Hemoglobin: 7.8 g/dL — ABNORMAL LOW (ref 12.0–15.0)
Hemoglobin: 5.8 g/dL — CL (ref 12.0–15.0)
MCH: 29.5 pg (ref 26.0–34.0)
MCH: 29.4 pg (ref 26.0–34.0)
MCHC: 32.6 g/dL (ref 30.0–36.0)
MCHC: 31.5 g/dL (ref 30.0–36.0)
MCV: 90.5 fL (ref 80.0–100.0)
MCV: 93.4 fL (ref 80.0–100.0)
Platelets: 179 10*3/uL (ref 150–400)
Platelets: 164 10*3/uL (ref 150–400)
RBC: 2.64 MIL/uL — ABNORMAL LOW (ref 3.87–5.11)
RBC: 1.97 MIL/uL — ABNORMAL LOW (ref 3.87–5.11)
RDW: 15.5 % (ref 11.5–15.5)
RDW: 15.6 % — ABNORMAL HIGH (ref 11.5–15.5)
WBC: 12.6 10*3/uL — ABNORMAL HIGH (ref 4.0–10.5)
WBC: 12 10*3/uL — ABNORMAL HIGH (ref 4.0–10.5)
nRBC: 0.2 % (ref 0.0–0.2)
nRBC: 0.2 % (ref 0.0–0.2)

## 2020-02-09 LAB — D-DIMER, QUANTITATIVE: D-Dimer, Quant: 1.79 ug/mL-FEU — ABNORMAL HIGH (ref 0.00–0.50)

## 2020-02-09 LAB — COMPREHENSIVE METABOLIC PANEL
ALT: 33 U/L (ref 0–44)
AST: 47 U/L — ABNORMAL HIGH (ref 15–41)
Albumin: 2.8 g/dL — ABNORMAL LOW (ref 3.5–5.0)
Alkaline Phosphatase: 48 U/L (ref 38–126)
Anion gap: 14 (ref 5–15)
BUN: 77 mg/dL — ABNORMAL HIGH (ref 8–23)
CO2: 16 mmol/L — ABNORMAL LOW (ref 22–32)
Calcium: 8.7 mg/dL — ABNORMAL LOW (ref 8.9–10.3)
Chloride: 106 mmol/L (ref 98–111)
Creatinine, Ser: 2.37 mg/dL — ABNORMAL HIGH (ref 0.44–1.00)
GFR calc Af Amer: 21 mL/min — ABNORMAL LOW (ref >60)
GFR calc non Af Amer: 18 mL/min — ABNORMAL LOW (ref >60)
Glucose, Bld: 242 mg/dL — ABNORMAL HIGH (ref 70–99)
Potassium: 4.9 mmol/L (ref 3.5–5.1)
Sodium: 136 mmol/L (ref 135–145)
Total Bilirubin: 0.6 mg/dL (ref 0.3–1.2)
Total Protein: 5.2 g/dL — ABNORMAL LOW (ref 6.5–8.1)

## 2020-02-09 LAB — HEMOGLOBIN AND HEMATOCRIT, BLOOD
HCT: 32.3 % — ABNORMAL LOW (ref 36.0–46.0)
Hemoglobin: 10.7 g/dL — ABNORMAL LOW (ref 12.0–15.0)

## 2020-02-09 LAB — PREPARE RBC (CROSSMATCH)

## 2020-02-09 LAB — ABO/RH: ABO/RH(D): A POS

## 2020-02-09 LAB — GLUCOSE, CAPILLARY
Glucose-Capillary: 204 mg/dL — ABNORMAL HIGH (ref 70–99)
Glucose-Capillary: 172 mg/dL — ABNORMAL HIGH (ref 70–99)
Glucose-Capillary: 183 mg/dL — ABNORMAL HIGH (ref 70–99)
Glucose-Capillary: 210 mg/dL — ABNORMAL HIGH (ref 70–99)

## 2020-02-09 LAB — FERRITIN: Ferritin: 692 ng/mL — ABNORMAL HIGH (ref 11–307)

## 2020-02-09 LAB — C-REACTIVE PROTEIN: CRP: 1.6 mg/dL — ABNORMAL HIGH (ref <1.0)

## 2020-02-09 LAB — APTT
aPTT: 84 seconds — ABNORMAL HIGH (ref 24–36)
aPTT: 28 seconds (ref 24–36)

## 2020-02-09 LAB — PROTIME-INR
INR: 1.2 (ref 0.8–1.2)
Prothrombin Time: 14.9 seconds (ref 11.4–15.2)

## 2020-02-09 LAB — MAGNESIUM: Magnesium: 2.7 mg/dL — ABNORMAL HIGH (ref 1.7–2.4)

## 2020-02-09 MED ORDER — SODIUM CHLORIDE 0.9 % IV BOLUS
500.0000 mL | Freq: Once | INTRAVENOUS | Status: AC
  Administered 2020-02-09: 500 mL via INTRAVENOUS

## 2020-02-09 MED ORDER — PANTOPRAZOLE SODIUM 40 MG PO TBEC: 40.0000 mg | DELAYED_RELEASE_TABLET | Freq: Two times a day (BID) | ORAL | Status: DC

## 2020-02-09 MED ORDER — PROTAMINE SULFATE 10 MG/ML IV SOLN
50.0000 mg | INTRAVENOUS | Status: AC
  Administered 2020-02-09: 50 mg via INTRAVENOUS
  Filled 2020-02-09: qty 5

## 2020-02-09 MED ORDER — SODIUM CHLORIDE 0.9% IV SOLUTION: Freq: Once | INTRAVENOUS | Status: AC

## 2020-02-09 MED ORDER — PANTOPRAZOLE SODIUM 40 MG IV SOLR
40.0000 mg | Freq: Two times a day (BID) | INTRAVENOUS | Status: DC
  Administered 2020-02-09 – 2020-02-10 (×3): 40 mg via INTRAVENOUS
  Filled 2020-02-09 (×4): qty 40

## 2020-02-09 MED ORDER — SODIUM CHLORIDE 0.9 % IV SOLN: INTRAVENOUS | Status: DC

## 2020-02-09 MED ORDER — SODIUM CHLORIDE 0.9 % IV BOLUS
1000.0000 mL | Freq: Once | INTRAVENOUS | Status: AC
  Administered 2020-02-09: 1000 mL via INTRAVENOUS

## 2020-02-09 MED ORDER — FENTANYL CITRATE (PF) 100 MCG/2ML IJ SOLN
12.5000 ug | INTRAMUSCULAR | Status: DC | PRN
  Administered 2020-02-09 (×2): 12.5 ug via INTRAVENOUS
  Administered 2020-02-10 – 2020-02-11 (×9): 25 ug via INTRAVENOUS
  Filled 2020-02-09 (×11): qty 2

## 2020-02-09 NOTE — Progress Notes (Signed)
Discussed with Dr. Thereasa Solo that pt BP has stabilized in the 110s. MD okay to leave pt on 5th floor until repeat hgb resulted

## 2020-02-09 NOTE — Care Management Important Message (Signed)
Important Message  Patient Details IM Letter given to the Patient Name: Tami Richards MRN: [id number] Date of Birth: 01/02/1935   Medicare Important Message Given:  Yes     [name] 02/09/2020, 11:25 AM

## 2020-02-09 NOTE — Progress Notes (Signed)
First unit of blood infusing as Emergency release and being administered rapidly via pressure bag by rapid response nurse. BP at this time 122/73. Awaiting placement to step-down unit for closer observation.

## 2020-02-09 NOTE — Progress Notes (Addendum)
Tami Richards  YDX:[id number] DOB: September 07, 1934 DOA: 02/05/2020 PCP: Sueanne Margarita, DO    Brief Narrative:  85 year old with a history of HOH, DM 2, CKD 4, hypothyroidism, HTN, and HLD who is unvaccinated against CoViD who was brought to the ED via EMS due to mental status changes and severe profound weakness.  According to the family the patient had been sick for approximately 1 week. She was tested 9/3 and found to be positive for Covid.  She received a Regeneron infusion 9/5.  When she presented for the infusion she was found to be hypotensive with systolics in the 72C.  She was treated with a bolus of fluid.  Oxygen saturations at that time varied between 84 to 96% on room air.  After volume resuscitation she had stabilized and was able to be discharged home.  After returning home unfortunately she continued to decline.  She developed low-grade fevers.  When she became confused the family brought her back to the ED.  Significant Events:  9/3 + CoViD testing 9/5 mAb infusion  9/6 admit   Date of Positive COVID Test:  02/02/20  Vaccination Status: NOT vaccinated   COVID-19 specific Treatment: Baricitinib 9/6 > Steroid 9/6 > Remdesivir 9/6 >  Antimicrobials:  None  DVT prophylaxis: Subcutaneous heparin  Subjective: Patient experienced significant hypotension early this morning with blood pressures as low as 69/50.  Oxygen saturations have improved to 97-100% on 1 L nasal cannula only. Creatinine is rising.  I was alerted by the patient's bedside RN this afternoon that despite fluid boluses and transient improvement in her blood pressure the patient's blood pressure had again decreased this time as low as 70s with altered mental status.  I returned to the room to examine the patient.  Repeat hemoglobin returned and revealed a significant decline.  Clinically findings were consistent with either an occult GI bleed or retroperitoneal hemorrhage.  Given the patient's complaints  earlier in the day of right flank pain I suspect it is the latter.  At the time of my reevaluation the patient was in significant pain but was alert and able to speak with me.  I explained the situation to her and my concern that if we were not able to stop her hemorrhage that this could likely lead to cardiac arrest and death.  She voiced understanding.  We discussed goals of care and she made it very clear to me that she would not wish to be resuscitated or placed on a ventilator should she experience a complete cardiovascular collapse.  I do not feel this is an appropriate.  With her consent I then called and spoke with her daughter to explain the situation to her as well.  This included discussion about blood transfusions which the patient herself had already consented to as well as discussion of NO CODE BLUE status.  I reassured her that we will continue to provide aggressive medical care short of the DNR order.  Given the life-threatening nature of the patient's critical illness I was present at bedside for an extended length of time beginning at 15:30 and extending through until at least 17:00.   Assessment & Plan:  Hemorrhagic shock - RPH v/s occult GIB Volume expand -transfuse 2 units of O- blood emergency release and keep 2 units ahead - infuse via pressure bag - monitor blood pressure closely  Recent Labs  Lab 02/07/20 0405 02/08/20 0405 02/09/20 0423 02/09/20 1058 02/09/20 1540  HGB 12.3 12.0 9.9* 7.8* 5.8*  Coagulopathy Patient received her last dose of subcu heparin at approximately 6 AM today -despite this her PTT this afternoon remained quite elevated at approximately 85 -given her life-threatening bleeding I am dosing her with max dose protamine  COVID Pneumonia -acute hypoxic respiratory failure Received Regeneron 9/5 -continue remdesivir baricitinib and steroid -wean O2 as able - crp falling - PT/OT evaluations  Recent Labs  Lab 02/05/20 1128 02/06/20 0454  02/07/20 0405 02/08/20 0405 02/09/20 0423  DDIMER 1.45* 1.88* 1.90* 1.81* 1.79*  FERRITIN 931* 1,000* 1,057* 956* 692*  CRP 10.8* 12.9* 6.1* 2.8* 1.6*  ALT 25 27 28 31  33  PROCALCITON 0.61  --   --   --   --     Acute metabolic encephalopathy Appears to have simply been related to acute illness and dehydration - improved  CKD stage IV Baseline creatinine appears to be approximately 2.2 -creatinine rising today in conjunction with hypotension  DM2 CBG variable - follow w/o change for now   Essential HTN Not presently an issue  HLD Continue atorvastatin  Hypothyroidism Continue levothyroxine  Code Status: FULL CODE Family Communication:  Status is: Inpatient  Remains inpatient appropriate because:Inpatient level of care appropriate due to severity of illness   Dispo: The patient is from: Home              Anticipated d/c is to: Home              Anticipated d/c date is: 3 days              Patient currently is not medically stable to d/c.  Consultants:  none  Objective: Blood pressure 125/62, pulse 66, temperature (!) 97.5 F (36.4 C), temperature source Rectal, resp. rate 18, SpO2 100 %.  Intake/Output Summary (Last 24 hours) at 02/09/2020 1849 Last data filed at 02/09/2020 1737 Gross per 24 hour  Intake 1135.16 ml  Output 300 ml  Net 835.16 ml   There were no vitals filed for this visit.  Examination: General: No acute respiratory distress - moaning in pain  Lungs: Fine diffuse crackles w/o wheezing  Cardiovascular: RRR without murmur Abdomen: Tender to palpation across right flank and right posterior -no severe ecchymosis or palpable hematoma -abdomen is soft and not distended -no rebound Extremities: No significant edema or cyanosis bilateral lower extremities  CBC: Recent Labs  Lab 02/07/20 0405 02/07/20 0405 02/08/20 0405 02/08/20 0405 02/09/20 0423 02/09/20 1058 02/09/20 1540  WBC 9.8   < > 9.0   < > 11.1* 12.6* 12.0*  NEUTROABS 7.5  --   7.3  --  8.3*  --   --   HGB 12.3   < > 12.0   < > 9.9* 7.8* 5.8*  HCT 37.7   < > 37.0   < > 30.9* 23.9* 18.4*  MCV 90.6   < > 91.6   < > 92.8 90.5 93.4  PLT 166   < > 177   < > 203 179 164   < > = values in this interval not displayed.   Basic Metabolic Panel: Recent Labs  Lab 02/07/20 0405 02/08/20 0405 02/09/20 0423  NA 137 136 136  K 4.9 4.8 4.9  CL 108 109 106  CO2 21* 20* 16*  GLUCOSE 167* 197* 242*  BUN 52* 62* 77*  CREATININE 2.02* 2.04* 2.37*  CALCIUM 8.9 8.8* 8.7*  MG 2.5* 2.6* 2.7*   GFR: CrCl cannot be calculated (Unknown ideal weight.).  Liver Function Tests: Recent Labs  Lab 02/06/20 0454 02/07/20 0405 02/08/20 0405 02/09/20 0423  AST 48* 47* 51* 47*  ALT 27 28 31  33  ALKPHOS 81 64 66 48  BILITOT 0.6 0.4 0.4 0.6  PROT 6.4* 6.2* 6.2* 5.2*  ALBUMIN 4.3 3.1* 3.1* 2.8*    HbA1C: Hgb A1c MFr Bld  Date/Time Value Ref Range Status  02/07/2020 04:05 AM 7.7 (H) 4.8 - 5.6 % Final    Comment:    (NOTE) Pre diabetes:          5.7%-6.4%  Diabetes:              >6.4%  Glycemic control for   <7.0% adults with diabetes     CBG: Recent Labs  Lab 02/08/20 1632 02/08/20 2143 02/09/20 0818 02/09/20 1143 02/09/20 1529  GLUCAP 83 179* 204* 172* 183*    Recent Results (from the past 240 hour(s))  Blood Culture (routine x 2)     Status: Abnormal   Collection Time: 02/05/20 11:28 AM   Specimen: BLOOD  Result Value Ref Range Status   Specimen Description   Final    BLOOD LEFT ANTECUBITAL Performed at [facility], [place] [address] [place], [place] [address]    Special Requests   Final    BOTTLES DRAWN AEROBIC AND ANAEROBIC Blood Culture adequate volume Performed at [place] [address] [place], [place] [address]    Culture  Setup Time   Final    GRAM POSITIVE COCCI IN CLUSTERS AEROBIC BOTTLE ONLY CRITICAL RESULT CALLED TO, READ BACK BY AND VERIFIED WITH: J,GADHIA PHARMD @1811  02/06/20 EB    Culture (A)  Final     ROTHIA MUCILAGINOSA THE SIGNIFICANCE OF ISOLATING THIS ORGANISM FROM A SINGLE SET OF BLOOD CULTURES WHEN MULTIPLE SETS ARE DRAWN IS UNCERTAIN. PLEASE NOTIFY THE MICROBIOLOGY DEPARTMENT WITHIN ONE WEEK IF SPECIATION AND SENSITIVITIES ARE REQUIRED. Performed at [facility], [place] [address] [place], [place] [address]    Report Status 02/08/2020 FINAL  Final  Blood Culture ID Panel (Reflexed)     Status: None   Collection Time: 02/05/20 11:28 AM  Result Value Ref Range Status   Enterococcus faecalis NOT DETECTED NOT DETECTED Final   Enterococcus Faecium NOT DETECTED NOT DETECTED Final   Listeria monocytogenes NOT DETECTED NOT DETECTED Final   Staphylococcus species NOT DETECTED NOT DETECTED Final   Staphylococcus aureus (BCID) NOT DETECTED NOT DETECTED Final   Staphylococcus epidermidis NOT DETECTED NOT DETECTED Final   Staphylococcus lugdunensis NOT DETECTED NOT DETECTED Final   Streptococcus species NOT DETECTED NOT DETECTED Final   Streptococcus agalactiae NOT DETECTED NOT DETECTED Final   Streptococcus pneumoniae NOT DETECTED NOT DETECTED Final   Streptococcus pyogenes NOT DETECTED NOT DETECTED Final   A.calcoaceticus-baumannii NOT DETECTED NOT DETECTED Final   Bacteroides fragilis NOT DETECTED NOT DETECTED Final   Enterobacterales NOT DETECTED NOT DETECTED Final   Enterobacter cloacae complex NOT DETECTED NOT DETECTED Final   Escherichia coli NOT DETECTED NOT DETECTED Final   Klebsiella aerogenes NOT DETECTED NOT DETECTED Final   Klebsiella oxytoca NOT DETECTED NOT DETECTED Final   Klebsiella pneumoniae NOT DETECTED NOT DETECTED Final   Proteus species NOT DETECTED NOT DETECTED Final   Salmonella species NOT DETECTED NOT DETECTED Final   Serratia marcescens NOT DETECTED NOT DETECTED Final   Haemophilus influenzae NOT DETECTED NOT DETECTED Final   Neisseria meningitidis NOT DETECTED NOT DETECTED Final   Pseudomonas aeruginosa NOT DETECTED NOT DETECTED Final   Stenotrophomonas  maltophilia  NOT DETECTED NOT DETECTED Final   Candida albicans NOT DETECTED NOT DETECTED Final   Candida auris NOT DETECTED NOT DETECTED Final   Candida glabrata NOT DETECTED NOT DETECTED Final   Candida krusei NOT DETECTED NOT DETECTED Final   Candida parapsilosis NOT DETECTED NOT DETECTED Final   Candida tropicalis NOT DETECTED NOT DETECTED Final   Cryptococcus neoformans/gattii NOT DETECTED NOT DETECTED Final    Comment: Performed at [facility], [place] [address] [place], [place] [address]  Blood Culture (routine x 2)     Status: None (Preliminary result)   Collection Time: 02/05/20 11:33 AM   Specimen: BLOOD  Result Value Ref Range Status   Specimen Description   Final    BLOOD LEFT ANTECUBITAL Performed at [facility], [place] [address] [place], [place] [address]    Special Requests   Final    BOTTLES DRAWN AEROBIC AND ANAEROBIC Blood Culture adequate volume Performed at [place] [address], [place], [place] [address]    Culture   Final    NO GROWTH 4 DAYS Performed at [facility], [place] [address], [place], [place] [address]    Report Status PENDING  Incomplete     Scheduled Meds: . vitamin C  500 mg Oral Daily  . aspirin EC  81 mg Oral Daily  . atorvastatin  10 mg Oral Daily  . baricitinib  1 mg Oral Daily  . insulin aspart  0-15 Units Subcutaneous TID WC  . insulin detemir  10 Units Subcutaneous BID  . Ipratropium-Albuterol  1 puff Inhalation BID  . levothyroxine  50 mcg Oral Q0600  . linagliptin  5 mg Oral Daily  . loratadine  10 mg Oral Daily  . mouth rinse  15 mL Mouth Rinse BID  . montelukast  10 mg Oral QHS  . multivitamin with minerals  1 tablet Oral Daily  . pantoprazole  40 mg Oral BID AC  . predniSONE  50 mg Oral Q breakfast  . zinc sulfate  220 mg Oral Daily   Continuous Infusions: . sodium chloride 75 mL/hr at 02/09/20 1146     LOS: 4 days   Cherene Altes, MD [facility] Office   [phone] Pager - Text Page per Shea Evans  If 7PM-7AM, please contact night-coverage per Amion 02/09/2020, 6:49 PM

## 2020-02-09 NOTE — Significant Event (Signed)
Rapid Response Event Note   Reason for Call :  Bedside nurse called dt new onset of lethargy and hypotension. See flowsheets for BP  Initial Focused Assessment:  Neuro: alert to voice, lethargic, but arousable and oriented x4  Resp: 2L [place] satting 100%. No resp distress. Breath sounds clear and equal Cardiac: NSR in the 60-70s. BP 81/31. S1 and S2 heard GI: Abdomen firm and tender to touch. Bruising on lower abdomen bilaterally   Interventions:  500cc bolus started MD notified and came to bedside Stat type and screen, CBC ordered. 1616: Hbg resulted at 5.8. MD notified. Emergent 2 units PRBCs ordered. RRT nurse returned to bedside and was given verbal orders to use pressure bags to give the 2 units PRBCs. 2 units given over 30 minutes. Repeat BP 117/62. MD also ordered protamine. Protamine given after PRBCs finished infusing   Plan of Care:  Plan to move pt to SD bed for closer monitoring Repeat H and H ordered per protocol    Event Summary:   MD Notified: Dr. Thereasa Solo Call Time: [place] Time: [place] Time: [place] RN, BSN

## 2020-02-09 NOTE — Progress Notes (Signed)
Rapid Response called at 1520 due to patient being hypotensive and very drowsy. MD notified of 74/46 manual BP. 500cc bolus given per order. Type and screen obtained and MD spoke with patients daughter regarding code status which is now changed to DNR and consent for blood transfusion obtained at this time via daughter. This RN signed to witness. Will continue to monitor closely.

## 2020-02-09 NOTE — Progress Notes (Signed)
Patient's blood pressure of 82/50, HR 70; provider on call made aware; waiting for further order. Will continue to monitor pt.

## 2020-02-09 NOTE — Progress Notes (Signed)
Physical Therapy Treatment Patient Details Name: Tami Richards MRN: [id number] DOB: [birth year] Today's Date: 02/09/2020    History of Present Illness 85 year old female who is hard of hearing and a poor historian with a past medical history of diabetes, CKD 4, hypothyroidism, hypertension and hyperlipidemia who is unvaccinated against COVID-19 who was brought in by EMS from home for change in mental status and weakness. She is being treated for COVID.    PT Comments    Pt lethargic, reports she didn't sleep well last night and difficult to maintain arousal. Pt agreeable to exercises but declines getting out of bed due to "I just hurt all over". Pt tolerates supine ankle pumps and heel slides with cues for motor control and decreased momentum, facial wincing noted with mobility. Pt cued to reposition self to improve discomfort and able to use bedrail to reposition in bed. Session limited due to lethargy this session. Pt unknowingly soiled and noted when rolling into L sidelying in bed so nurse tech outside pt door notified and went to get supplies to clean pt up. Patient will benefit from continued physical therapy in hospital and recommendations below to increase strength, balance, endurance for safe ADLs and gait.     Follow Up Recommendations  Home health PT;Supervision/Assistance - 24 hour     Equipment Recommendations  None recommended by PT    Recommendations for Other Services       Precautions / Restrictions Precautions Precautions: Fall Restrictions Weight Bearing Restrictions: No    Mobility  Bed Mobility Overal bed mobility: Needs Assistance Bed Mobility: Rolling Rolling: Supervision  General bed mobility comments: cues to roll into sidelying to alleviate pain, pt able to use handrail to reposition self to comfort  Transfers  General transfer comment: pt declines  Ambulation/Gait  General Gait Details: pt declines   Stairs              Wheelchair Mobility    Modified Rankin (Stroke Patients Only)       Balance                       Cognition Arousal/Alertness: Lethargic Behavior During Therapy: WFL for tasks assessed/performed Overall Cognitive Status: No family/caregiver present to determine baseline cognitive functioning  General Comments: pt lethargic, tired and reports she didn't sleep well last night      Exercises General Exercises - Lower Extremity Ankle Circles/Pumps: AROM;Strengthening;Both;15 reps;Supine Heel Slides: AROM;Strengthening;Both;5 reps;Supine    General Comments General comments (skin integrity, edema, etc.): Pt supine in bed on 1L O2 with SpO2 83-97%, poor signal noted when mobilizing and SpO2 reading <90%, while hand with pulse ox not moving SpO2 <90%, no SOB noted      Pertinent Vitals/Pain Pain Assessment: Faces Faces Pain Scale: Hurts little more Pain Location: "I just hurt all over" Pain Descriptors / Indicators: Aching;Sore Pain Intervention(s): Limited activity within patient's tolerance;Monitored during session;Repositioned    Home Living                      Prior Function            PT Goals (current goals can now be found in the care plan section) Acute Rehab PT Goals Patient Stated Goal: to go home PT Goal Formulation: With patient Time For Goal Achievement: 02/21/20 Potential to Achieve Goals: Good Progress towards PT goals: Not progressing toward goals - comment (pain/lethargy limiting session)    Frequency    Min 3X/week  PT Plan Current plan remains appropriate    Co-evaluation              AM-PAC PT "6 Clicks" Mobility   Outcome Measure  Help needed turning from your back to your side while in a flat bed without using bedrails?: None Help needed moving from lying on your back to sitting on the side of a flat bed without using bedrails?: None Help needed moving to and from a bed to a chair (including a  wheelchair)?: A Little Help needed standing up from a chair using your arms (e.g., wheelchair or bedside chair)?: A Little Help needed to walk in hospital room?: A Little Help needed climbing 3-5 steps with a railing? : A Little 6 Click Score: 20    End of Session Equipment Utilized During Treatment: Oxygen Activity Tolerance: Patient limited by pain;Patient limited by lethargy Patient left: in bed;with call bell/phone within reach;with bed alarm set Nurse Communication: Mobility status;Other (comment) (pt soiled) PT Visit Diagnosis: Unsteadiness on feet (R26.81);Muscle weakness (generalized) (M62.81)     Time: 1610-9604 PT Time Calculation (min) (ACUTE ONLY): 8 min  Charges:  $Therapeutic Exercise: 8-22 mins                      Tori Sabastion Hrdlicka PT, DPT 02/09/20, 1:52 PM

## 2020-02-09 NOTE — Progress Notes (Signed)
   02/09/20 0628  Assess: MEWS Score  BP (!) 69/50  Pulse Rate (!) 59  Resp 20  SpO2 99 %  O2 Device Nasal Cannula  O2 Flow Rate (L/min) 1 L/min  Assess: MEWS Score  MEWS Temp 0  MEWS Systolic 3  MEWS Pulse 0  MEWS RR 0  MEWS LOC 0  MEWS Score 3  MEWS Score Color Yellow  Assess: if the MEWS score is Yellow or Red  Were vital signs taken at a resting state? Yes  Early Detection of Sepsis Score *See Row Information* Low  MEWS guidelines implemented *See Row Information* Yes  Treat  MEWS Interventions Other (Comment) (provider on call paged with new order recieved)  Take Vital Signs  Increase Vital Sign Frequency  Yellow: Q 2hr X 2 then Q 4hr X 2, if remains yellow, continue Q 4hrs  Escalate  MEWS: Escalate Yellow: discuss with charge nurse/RN and consider discussing with provider and RRT  Notify: Charge Nurse/RN  Name of Charge Nurse/RN Notified Vera, RN  Date Charge Nurse/RN Notified 02/09/20  Time Charge Nurse/RN Notified 0092  Notify: Provider  Provider Name/Title Jeannette Corpus, NP  Date Provider Notified 02/09/20  Time Provider Notified [phone]  Notification Type Page  Notification Reason Change in status  Response See new orders  Date of Provider Response 02/09/20  Time of Provider Response [phone]  Document  Patient Outcome Stabilized after interventions  Progress note created (see row info) Yes    VS rechecked after 524ml bolus given; BP still low; rechecked couple times; provider on call paged with new order received; pt was put back on cardiac monitoring d/t hypotension; IV bolus stilll running, rechecked BP 94/53 at 0739; pt is resting at this time, alert and converse, c/o mild pain on right hip PRN given w/ some relief. Day shift nurse made aware of the event.

## 2020-02-10 ENCOUNTER — Inpatient Hospital Stay (HOSPITAL_COMMUNITY): Payer: Medicare Other

## 2020-02-10 LAB — CBC
HCT: 29.8 % — ABNORMAL LOW (ref 36.0–46.0)
HCT: 24.5 % — ABNORMAL LOW (ref 36.0–46.0)
Hemoglobin: 9.8 g/dL — ABNORMAL LOW (ref 12.0–15.0)
Hemoglobin: 8.4 g/dL — ABNORMAL LOW (ref 12.0–15.0)
MCH: 29 pg (ref 26.0–34.0)
MCH: 29.8 pg (ref 26.0–34.0)
MCHC: 32.9 g/dL (ref 30.0–36.0)
MCHC: 34.3 g/dL (ref 30.0–36.0)
MCV: 88.2 fL (ref 80.0–100.0)
MCV: 86.9 fL (ref 80.0–100.0)
Platelets: 125 10*3/uL — ABNORMAL LOW (ref 150–400)
Platelets: 136 10*3/uL — ABNORMAL LOW (ref 150–400)
RBC: 3.38 MIL/uL — ABNORMAL LOW (ref 3.87–5.11)
RBC: 2.82 MIL/uL — ABNORMAL LOW (ref 3.87–5.11)
RDW: 16.9 % — ABNORMAL HIGH (ref 11.5–15.5)
RDW: 17 % — ABNORMAL HIGH (ref 11.5–15.5)
WBC: 15.6 10*3/uL — ABNORMAL HIGH (ref 4.0–10.5)
WBC: 11.8 10*3/uL — ABNORMAL HIGH (ref 4.0–10.5)
nRBC: 0.1 % (ref 0.0–0.2)
nRBC: 0.2 % (ref 0.0–0.2)

## 2020-02-10 LAB — COMPREHENSIVE METABOLIC PANEL
ALT: 37 U/L (ref 0–44)
AST: 53 U/L — ABNORMAL HIGH (ref 15–41)
Albumin: 3 g/dL — ABNORMAL LOW (ref 3.5–5.0)
Alkaline Phosphatase: 46 U/L (ref 38–126)
Anion gap: 12 (ref 5–15)
BUN: 84 mg/dL — ABNORMAL HIGH (ref 8–23)
CO2: 16 mmol/L — ABNORMAL LOW (ref 22–32)
Calcium: 8.4 mg/dL — ABNORMAL LOW (ref 8.9–10.3)
Chloride: 110 mmol/L (ref 98–111)
Creatinine, Ser: 2.61 mg/dL — ABNORMAL HIGH (ref 0.44–1.00)
GFR calc Af Amer: 19 mL/min — ABNORMAL LOW (ref >60)
GFR calc non Af Amer: 16 mL/min — ABNORMAL LOW (ref >60)
Glucose, Bld: 215 mg/dL — ABNORMAL HIGH (ref 70–99)
Potassium: 4.7 mmol/L (ref 3.5–5.1)
Sodium: 138 mmol/L (ref 135–145)
Total Bilirubin: 0.8 mg/dL (ref 0.3–1.2)
Total Protein: 5.1 g/dL — ABNORMAL LOW (ref 6.5–8.1)

## 2020-02-10 LAB — GLUCOSE, CAPILLARY
Glucose-Capillary: 142 mg/dL — ABNORMAL HIGH (ref 70–99)
Glucose-Capillary: 107 mg/dL — ABNORMAL HIGH (ref 70–99)
Glucose-Capillary: 64 mg/dL — ABNORMAL LOW (ref 70–99)
Glucose-Capillary: 200 mg/dL — ABNORMAL HIGH (ref 70–99)
Glucose-Capillary: 101 mg/dL — ABNORMAL HIGH (ref 70–99)

## 2020-02-10 LAB — CULTURE, BLOOD (ROUTINE X 2)
Culture: NO GROWTH
Special Requests: ADEQUATE

## 2020-02-10 LAB — APTT
aPTT: 22 seconds — ABNORMAL LOW (ref 24–36)
aPTT: 29 seconds (ref 24–36)

## 2020-02-10 LAB — FERRITIN: Ferritin: 615 ng/mL — ABNORMAL HIGH (ref 11–307)

## 2020-02-10 LAB — C-REACTIVE PROTEIN: CRP: 2.3 mg/dL — ABNORMAL HIGH (ref <1.0)

## 2020-02-10 LAB — D-DIMER, QUANTITATIVE: D-Dimer, Quant: 1.69 ug/mL-FEU — ABNORMAL HIGH (ref 0.00–0.50)

## 2020-02-10 LAB — PREPARE RBC (CROSSMATCH)

## 2020-02-10 LAB — MAGNESIUM: Magnesium: 2.6 mg/dL — ABNORMAL HIGH (ref 1.7–2.4)

## 2020-02-10 MED ORDER — SODIUM CHLORIDE 0.9% IV SOLUTION: Freq: Once | INTRAVENOUS | Status: DC

## 2020-02-10 MED ORDER — DEXTROSE-NACL 5-0.9 % IV SOLN: INTRAVENOUS | Status: DC

## 2020-02-10 NOTE — Progress Notes (Signed)
Daughter called and I updated her,answered questions. She reports pt was alert and oriented prior to hospitalization.

## 2020-02-10 NOTE — Progress Notes (Signed)
Per dayshift nurse report and notes pt to stay on 5th floor until H/H resulted. Pt's BP's stable, Hgb resulted as 10.7 notified provider on call, and asked whether patient needs to still be transferred to SD unit or not. No response at this time. Patient has been stable, will continue to monitor.

## 2020-02-10 NOTE — Progress Notes (Signed)
Hypoglycemic Event  CBG: 64  Treatment: 4 oz juice/soda  Symptoms: None  Follow-up CBG: Time:1743 CBG Result:200  Possible Reasons for Event: Inadequate meal intake  Comments/MD notified treated per protocol    Etta Quill

## 2020-02-10 NOTE — Plan of Care (Signed)

## 2020-02-10 NOTE — Progress Notes (Signed)
Alerted by central telemetry monitoring that patient has a 27 beat run of SVT, patient is currently asleep, appears asymptomatic, no sob or increased work of breathing, HR 98 on the monitor, on-call provider notified via text page through [facility], will keep alert for any new orders and continue to monitor patient.

## 2020-02-10 NOTE — Progress Notes (Signed)
Tami Richards  UTM:[id number] DOB: 1935/04/03 DOA: 02/05/2020 PCP: Sueanne Margarita, DO    Brief Narrative:  85 year old with a history of HOH, DM 2, CKD 4, hypothyroidism, HTN, and HLD who is unvaccinated against CoViD who was brought to the ED via EMS due to mental status changes and severe profound weakness.  According to the family the patient had been sick for approximately 1 week. She was tested 9/3 and found to be positive for Covid.  She received a Regeneron infusion 9/5.  When she presented for the infusion she was found to be hypotensive with systolics in the 56C.  She was treated with a bolus of fluid.  Oxygen saturations at that time varied between 84 to 96% on room air.  After volume resuscitation she had stabilized and was able to be discharged home.  After returning home unfortunately she continued to decline.  She developed low-grade fevers.  When she became confused the family brought her back to the ED.  Significant Events:  9/3 + CoViD testing 9/5 mAb infusion  9/6 admit   Date of Positive COVID Test:  02/02/20  Vaccination Status: NOT vaccinated   COVID-19 specific Treatment: Baricitinib 9/6 > Steroid 9/6 > Remdesivir 9/6 > 9/10  Antimicrobials:  None  DVT prophylaxis: Subcutaneous heparin  Subjective: Hemorrhagic shock resolved.  Oxygen saturations stable afebrile.  Coagulopathy corrected.  Patient is sedate today.  Blood pressure is stable at this time.  Hemoglobin does appear to be slowly creeping back down.  Assessment & Plan:  Hemorrhagic shock - RPH v/s occult GIB Volume expand - transfused 2 units of O- blood emergency release and kept 2 units ahead - shock state now resolved with apparent stabilization of hemoglobin -CT abdomen confirms large retroperitoneal hemorrhage -hemoglobin still trending downward therefore transfuse 2 additional crossmatched units today  Recent Labs  Lab 02/09/20 1058 02/09/20 1540 02/09/20 1945 02/10/20 0152  02/10/20 1056  HGB 7.8* 5.8* 10.7* 9.8* 8.4*     Coagulopathy Patient received her last dose of subcu heparin at approximately 6 AM 9/10 -despite this her PTT in the afternoon 9/10 remained quite elevated at approximately 85 -given her life-threatening bleeding I dosed her with max dose protamine -her PTT has corrected and is remaining stable  Recent Labs  Lab 02/09/20 1540 02/09/20 1945 02/10/20 0152 02/10/20 1056  APTT 84* 28 22* 29    COVID Pneumonia -acute hypoxic respiratory failure Received Regeneron 9/5 -has completed a course of remdesivir -continue baricitinib and steroid -mobilize as able  Recent Labs  Lab 02/05/20 1128 02/05/20 1128 02/06/20 0454 02/07/20 0405 02/08/20 0405 02/09/20 0423 02/10/20 0152  DDIMER 1.45*   < > 1.88* 1.90* 1.81* 1.79* 1.69*  FERRITIN 931*   < > 1,000* 1,057* 956* 692* 615*  CRP 10.8*   < > 12.9* 6.1* 2.8* 1.6* 2.3*  ALT 25   < > 27 28 31  33 37  PROCALCITON 0.61  --   --   --   --   --   --    < > = values in this interval not displayed.    Acute metabolic encephalopathy Appears to have simply been related to acute illness and dehydration -sedate again today likely due to acute stress of yesterday's episode as well as pain medication  CKD stage IV Baseline creatinine appears to be approximately 2.2 -creatinine rising again today in conjunction with yesterday -follow trend  DM2 CBG variable - follow w/o change for now   Essential HTN Not presently  an issue  HLD Continue atorvastatin  Hypothyroidism Continue levothyroxine  Code Status: FULL CODE Family Communication:  Status is: Inpatient  Remains inpatient appropriate because:Inpatient level of care appropriate due to severity of illness   Dispo: The patient is from: Home              Anticipated d/c is to: Home              Anticipated d/c date is: 3 days              Patient currently is not medically stable to d/c.  Consultants:  none  Objective: Blood  pressure (!) 149/63, pulse 84, temperature (!) 97.5 F (36.4 C), temperature source Oral, resp. rate 20, SpO2 100 %.  Intake/Output Summary (Last 24 hours) at 02/10/2020 1827 Last data filed at 02/10/2020 1734 Gross per 24 hour  Intake 759.91 ml  Output 115 ml  Net 644.91 ml   There were no vitals filed for this visit.  Examination: General: No acute respiratory distress -sedate Lungs: Fine diffuse crackles -no wheezing Cardiovascular: RRR without murmur Abdomen: Tender to palpation across right flank and right posterior -no severe ecchymosis or palpable hematoma -abdomen soft without rebound Extremities: No cyanosis or edema B LE   CBC: Recent Labs  Lab 02/07/20 0405 02/07/20 0405 02/08/20 0405 02/08/20 0405 02/09/20 0423 02/09/20 1058 02/09/20 1540 02/09/20 1540 02/09/20 1945 02/10/20 0152 02/10/20 1056  WBC 9.8   < > 9.0   < > 11.1*   < > 12.0*  --   --  15.6* 11.8*  NEUTROABS 7.5  --  7.3  --  8.3*  --   --   --   --   --   --   HGB 12.3   < > 12.0   < > 9.9*   < > 5.8*   < > 10.7* 9.8* 8.4*  HCT 37.7   < > 37.0   < > 30.9*   < > 18.4*   < > 32.3* 29.8* 24.5*  MCV 90.6   < > 91.6   < > 92.8   < > 93.4  --   --  88.2 86.9  PLT 166   < > 177   < > 203   < > 164  --   --  125* 136*   < > = values in this interval not displayed.   Basic Metabolic Panel: Recent Labs  Lab 02/08/20 0405 02/09/20 0423 02/10/20 0152  NA 136 136 138  K 4.8 4.9 4.7  CL 109 106 110  CO2 20* 16* 16*  GLUCOSE 197* 242* 215*  BUN 62* 77* 84*  CREATININE 2.04* 2.37* 2.61*  CALCIUM 8.8* 8.7* 8.4*  MG 2.6* 2.7* 2.6*   GFR: CrCl cannot be calculated (Unknown ideal weight.).  Liver Function Tests: Recent Labs  Lab 02/07/20 0405 02/08/20 0405 02/09/20 0423 02/10/20 0152  AST 47* 51* 47* 53*  ALT 28 31 33 37  ALKPHOS 64 66 48 46  BILITOT 0.4 0.4 0.6 0.8  PROT 6.2* 6.2* 5.2* 5.1*  ALBUMIN 3.1* 3.1* 2.8* 3.0*    HbA1C: Hgb A1c MFr Bld  Date/Time Value Ref Range Status    02/07/2020 04:05 AM 7.7 (H) 4.8 - 5.6 % Final    Comment:    (NOTE) Pre diabetes:          5.7%-6.4%  Diabetes:              >6.4%  Glycemic control  for   <7.0% adults with diabetes     CBG: Recent Labs  Lab 02/09/20 2121 02/10/20 0823 02/10/20 1215 02/10/20 1657 02/10/20 1743  GLUCAP 210* 142* 107* 64* 200*    Recent Results (from the past 240 hour(s))  Blood Culture (routine x 2)     Status: Abnormal   Collection Time: 02/05/20 11:28 AM   Specimen: BLOOD  Result Value Ref Range Status   Specimen Description   Final    BLOOD LEFT ANTECUBITAL Performed at [facility], [place] [address], [place], [place] [address]    Special Requests   Final    BOTTLES DRAWN AEROBIC AND ANAEROBIC Blood Culture adequate volume Performed at [place] [address], [place], [place] [address]    Culture  Setup Time   Final    GRAM POSITIVE COCCI IN CLUSTERS AEROBIC BOTTLE ONLY CRITICAL RESULT CALLED TO, READ BACK BY AND VERIFIED WITH: J,GADHIA PHARMD @1811  02/06/20 EB    Culture (A)  Final    ROTHIA MUCILAGINOSA THE SIGNIFICANCE OF ISOLATING THIS ORGANISM FROM A SINGLE SET OF BLOOD CULTURES WHEN MULTIPLE SETS ARE DRAWN IS UNCERTAIN. PLEASE NOTIFY THE MICROBIOLOGY DEPARTMENT WITHIN ONE WEEK IF SPECIATION AND SENSITIVITIES ARE REQUIRED. Performed at [facility], [place] [address] [place], [place] [address]    Report Status 02/08/2020 FINAL  Final  Blood Culture ID Panel (Reflexed)     Status: None   Collection Time: 02/05/20 11:28 AM  Result Value Ref Range Status   Enterococcus faecalis NOT DETECTED NOT DETECTED Final   Enterococcus Faecium NOT DETECTED NOT DETECTED Final   Listeria monocytogenes NOT DETECTED NOT DETECTED Final   Staphylococcus species NOT DETECTED NOT DETECTED Final   Staphylococcus aureus (BCID) NOT DETECTED NOT DETECTED Final   Staphylococcus epidermidis NOT DETECTED NOT DETECTED Final   Staphylococcus lugdunensis NOT DETECTED NOT  DETECTED Final   Streptococcus species NOT DETECTED NOT DETECTED Final   Streptococcus agalactiae NOT DETECTED NOT DETECTED Final   Streptococcus pneumoniae NOT DETECTED NOT DETECTED Final   Streptococcus pyogenes NOT DETECTED NOT DETECTED Final   A.calcoaceticus-baumannii NOT DETECTED NOT DETECTED Final   Bacteroides fragilis NOT DETECTED NOT DETECTED Final   Enterobacterales NOT DETECTED NOT DETECTED Final   Enterobacter cloacae complex NOT DETECTED NOT DETECTED Final   Escherichia coli NOT DETECTED NOT DETECTED Final   Klebsiella aerogenes NOT DETECTED NOT DETECTED Final   Klebsiella oxytoca NOT DETECTED NOT DETECTED Final   Klebsiella pneumoniae NOT DETECTED NOT DETECTED Final   Proteus species NOT DETECTED NOT DETECTED Final   Salmonella species NOT DETECTED NOT DETECTED Final   Serratia marcescens NOT DETECTED NOT DETECTED Final   Haemophilus influenzae NOT DETECTED NOT DETECTED Final   Neisseria meningitidis NOT DETECTED NOT DETECTED Final   Pseudomonas aeruginosa NOT DETECTED NOT DETECTED Final   Stenotrophomonas maltophilia NOT DETECTED NOT DETECTED Final   Candida albicans NOT DETECTED NOT DETECTED Final   Candida auris NOT DETECTED NOT DETECTED Final   Candida glabrata NOT DETECTED NOT DETECTED Final   Candida krusei NOT DETECTED NOT DETECTED Final   Candida parapsilosis NOT DETECTED NOT DETECTED Final   Candida tropicalis NOT DETECTED NOT DETECTED Final   Cryptococcus neoformans/gattii NOT DETECTED NOT DETECTED Final    Comment: Performed at [facility], [place] [address], [place], [place] [address]  Blood Culture (routine x 2)     Status: None   Collection Time: 02/05/20 11:33 AM   Specimen: BLOOD  Result  Value Ref Range Status   Specimen Description   Final    BLOOD LEFT ANTECUBITAL Performed at [facility], [place] [address], [place], [place] [address]    Special Requests   Final    BOTTLES DRAWN AEROBIC AND ANAEROBIC Blood Culture adequate  volume Performed at [place] [address] [place], [place] [address]    Culture   Final    NO GROWTH 5 DAYS Performed at [facility], [place] [address] [place], [place] [address]    Report Status 02/10/2020 FINAL  Final     Scheduled Meds: . sodium chloride   Intravenous Once  . vitamin C  500 mg Oral Daily  . baricitinib  1 mg Oral Daily  . insulin aspart  0-15 Units Subcutaneous TID WC  . insulin detemir  10 Units Subcutaneous BID  . Ipratropium-Albuterol  1 puff Inhalation BID  . levothyroxine  50 mcg Oral Q0600  . linagliptin  5 mg Oral Daily  . loratadine  10 mg Oral Daily  . mouth rinse  15 mL Mouth Rinse BID  . montelukast  10 mg Oral QHS  . multivitamin with minerals  1 tablet Oral Daily  . pantoprazole (PROTONIX) IV  40 mg Intravenous Q12H  . predniSONE  50 mg Oral Q breakfast  . zinc sulfate  220 mg Oral Daily   Continuous Infusions: . dextrose 5 % and 0.9% NaCl 75 mL/hr at 02/10/20 1803     LOS: 5 days   Cherene Altes, MD [facility] Office  [phone] Pager - Text Page per Shea Evans  If 7PM-7AM, please contact night-coverage per Amion 02/10/2020, 6:27 PM

## 2020-02-11 LAB — COMPREHENSIVE METABOLIC PANEL
ALT: 38 U/L (ref 0–44)
AST: 45 U/L — ABNORMAL HIGH (ref 15–41)
Albumin: 2.9 g/dL — ABNORMAL LOW (ref 3.5–5.0)
Alkaline Phosphatase: 52 U/L (ref 38–126)
Anion gap: 9 (ref 5–15)
BUN: 67 mg/dL — ABNORMAL HIGH (ref 8–23)
CO2: 22 mmol/L (ref 22–32)
Calcium: 8.7 mg/dL — ABNORMAL LOW (ref 8.9–10.3)
Chloride: 113 mmol/L — ABNORMAL HIGH (ref 98–111)
Creatinine, Ser: 2.08 mg/dL — ABNORMAL HIGH (ref 0.44–1.00)
GFR calc Af Amer: 25 mL/min — ABNORMAL LOW (ref >60)
GFR calc non Af Amer: 21 mL/min — ABNORMAL LOW (ref >60)
Glucose, Bld: 81 mg/dL (ref 70–99)
Potassium: 4 mmol/L (ref 3.5–5.1)
Sodium: 144 mmol/L (ref 135–145)
Total Bilirubin: 0.9 mg/dL (ref 0.3–1.2)
Total Protein: 5.2 g/dL — ABNORMAL LOW (ref 6.5–8.1)

## 2020-02-11 LAB — CBC
HCT: 32 % — ABNORMAL LOW (ref 36.0–46.0)
Hemoglobin: 11 g/dL — ABNORMAL LOW (ref 12.0–15.0)
MCH: 29.7 pg (ref 26.0–34.0)
MCHC: 34.4 g/dL (ref 30.0–36.0)
MCV: 86.5 fL (ref 80.0–100.0)
Platelets: 110 10*3/uL — ABNORMAL LOW (ref 150–400)
RBC: 3.7 MIL/uL — ABNORMAL LOW (ref 3.87–5.11)
RDW: 16.2 % — ABNORMAL HIGH (ref 11.5–15.5)
WBC: 12.4 10*3/uL — ABNORMAL HIGH (ref 4.0–10.5)
nRBC: 0.2 % (ref 0.0–0.2)

## 2020-02-11 LAB — TYPE AND SCREEN
ABO/RH(D): A POS
Antibody Screen: NEGATIVE
Unit division: 0
Unit division: 0
Unit division: 0
Unit division: 0

## 2020-02-11 LAB — BPAM RBC
Blood Product Expiration Date: 202110032359
Blood Product Expiration Date: 202110032359
Blood Product Expiration Date: 202110052359
Blood Product Expiration Date: 202110052359
ISSUE DATE / TIME: 202109101627
ISSUE DATE / TIME: 202109101627
ISSUE DATE / TIME: 202109111249
ISSUE DATE / TIME: 202109111512
Unit Type and Rh: 6200
Unit Type and Rh: 6200
Unit Type and Rh: 9500
Unit Type and Rh: 9500

## 2020-02-11 LAB — GLUCOSE, CAPILLARY
Glucose-Capillary: 84 mg/dL (ref 70–99)
Glucose-Capillary: 111 mg/dL — ABNORMAL HIGH (ref 70–99)
Glucose-Capillary: 182 mg/dL — ABNORMAL HIGH (ref 70–99)
Glucose-Capillary: 189 mg/dL — ABNORMAL HIGH (ref 70–99)

## 2020-02-11 LAB — APTT: aPTT: 29 seconds (ref 24–36)

## 2020-02-11 MED ORDER — IPRATROPIUM-ALBUTEROL 20-100 MCG/ACT IN AERS
1.0000 | INHALATION_SPRAY | Freq: Four times a day (QID) | RESPIRATORY_TRACT | Status: DC | PRN
  Filled 2020-02-11: qty 4

## 2020-02-11 MED ORDER — FENTANYL CITRATE (PF) 100 MCG/2ML IJ SOLN
12.5000 ug | INTRAMUSCULAR | Status: DC | PRN
  Administered 2020-02-11 – 2020-02-12 (×4): 12.5 ug via INTRAVENOUS
  Filled 2020-02-11 (×4): qty 2

## 2020-02-11 NOTE — Progress Notes (Signed)
Tami Richards  FUX:[id number] DOB: 10-29-1934 DOA: 02/05/2020 PCP: Sueanne Margarita, DO    Brief Narrative:  85 year old with a history of HOH, DM 2, CKD 4, hypothyroidism, HTN, and HLD who is unvaccinated against CoViD who was brought to the ED via EMS due to mental status changes and severe profound weakness.  According to the family the patient had been sick for approximately 1 week. She was tested 9/3 and found to be positive for Covid.  She received a Regeneron infusion 9/5.  When she presented for the infusion she was found to be hypotensive with systolics in the 02R.  She was treated with a bolus of fluid.  Oxygen saturations at that time varied between 84 to 96% on room air.  After volume resuscitation she had stabilized and was able to be discharged home.  After returning home unfortunately she continued to decline.  She developed low-grade fevers.  When she became confused the family brought her back to the ED.  Significant Events:  9/3 + CoViD testing 9/5 mAb infusion  9/6 admit   Date of Positive COVID Test:  02/02/20  Vaccination Status: NOT vaccinated   COVID-19 specific Treatment: Baricitinib 9/6 > Steroid 9/6 > Remdesivir 9/6 > 9/10  Antimicrobials:  None  DVT prophylaxis: Subcutaneous heparin  Subjective: Blood pressure stable and actually now elevated.  Oxygen saturations favorable.  Afebrile.  Hemoglobin has appropriately improved with transfusion.  Platelet count trending downward likely related to consumption. More alert today, though does remain somewhat lethargic.   Assessment & Plan:  Hemorrhagic shock - RPH v/s occult GIB transfused 2 units of O- blood emergency release and subsequently 2 additional units of crossmatched blood - shock state resolved - CT abdomen confirmed a  large retroperitoneal hemorrhage - Hgb appears stable for now   Recent Labs  Lab 02/09/20 1540 02/09/20 1945 02/10/20 0152 02/10/20 1056 02/11/20 0456  HGB 5.8* 10.7*  9.8* 8.4* 11.0*     Coagulopathy Patient received her last dose of subcu heparin at approximately 6 AM 9/10 -despite this her PTT in the afternoon 9/10 remained quite elevated at approximately 85 -given her life-threatening bleeding I dosed her with max dose protamine -her PTT has corrected and is remaining stable  Recent Labs  Lab 02/09/20 1540 02/09/20 1945 02/10/20 0152 02/10/20 1056 02/11/20 0456  APTT 84* 28 22* 29 29    COVID Pneumonia -acute hypoxic respiratory failure Received Regeneron 9/5 -has completed a course of remdesivir - much more stable at this time, but inolerant of oral intake - stop baricitinib - hold steroid for now given stable sats and confusion   Acute metabolic encephalopathy Appears to have simply been related to acute illness and dehydration initially, and not due to recovery from shock state as well as narcotic pain meds   CKD stage IV Baseline creatinine appears to be approximately 2.2 -creatinine bumped up post shock state but appears to be improving again at this time  DM2 Well-controlled in setting of very poor intake  Essential HTN Not presently an issue  HLD Continue atorvastatin  Hypothyroidism Continue levothyroxine  Code Status: FULL CODE Family Communication:  Status is: Inpatient  Remains inpatient appropriate because:Inpatient level of care appropriate due to severity of illness   Dispo: The patient is from: Home              Anticipated d/c is to: Home              Anticipated d/c date is: 3  days              Patient currently is not medically stable to d/c.  Consultants:  none  Objective: Blood pressure (!) 161/71, pulse 85, temperature 97.8 F (36.6 C), resp. rate 20, height 5\' 6"  (1.676 m), weight 65.3 kg, SpO2 95 %.  Intake/Output Summary (Last 24 hours) at 02/11/2020 0949 Last data filed at 02/11/2020 0600 Gross per 24 hour  Intake 1636.1 ml  Output 400 ml  Net 1236.1 ml   Filed Weights   02/10/20 2300    Weight: 65.3 kg    Examination: General: No acute respiratory distress - more alert today  Lungs: Fine diffuse crackles - improved air movement  Cardiovascular: RRR without murmur Abdomen: Tender to palpation across right flank and right posterior -no severe ecchymosis or palpable hematoma -abdomen soft Extremities: No cyanosis or edema B LE   CBC: Recent Labs  Lab 02/07/20 0405 02/07/20 0405 02/08/20 0405 02/08/20 0405 02/09/20 0423 02/09/20 1058 02/10/20 0152 02/10/20 1056 02/11/20 0456  WBC 9.8   < > 9.0   < > 11.1*   < > 15.6* 11.8* 12.4*  NEUTROABS 7.5  --  7.3  --  8.3*  --   --   --   --   HGB 12.3   < > 12.0   < > 9.9*   < > 9.8* 8.4* 11.0*  HCT 37.7   < > 37.0   < > 30.9*   < > 29.8* 24.5* 32.0*  MCV 90.6   < > 91.6   < > 92.8   < > 88.2 86.9 86.5  PLT 166   < > 177   < > 203   < > 125* 136* 110*   < > = values in this interval not displayed.   Basic Metabolic Panel: Recent Labs  Lab 02/08/20 0405 02/08/20 0405 02/09/20 0423 02/10/20 0152 02/11/20 0456  NA 136   < > 136 138 144  K 4.8   < > 4.9 4.7 4.0  CL 109   < > 106 110 113*  CO2 20*   < > 16* 16* 22  GLUCOSE 197*   < > 242* 215* 81  BUN 62*   < > 77* 84* 67*  CREATININE 2.04*   < > 2.37* 2.61* 2.08*  CALCIUM 8.8*   < > 8.7* 8.4* 8.7*  MG 2.6*  --  2.7* 2.6*  --    < > = values in this interval not displayed.   GFR: Estimated Creatinine Clearance: 19.2 mL/min (A) (by C-G formula based on SCr of 2.08 mg/dL (H)).  Liver Function Tests: Recent Labs  Lab 02/08/20 0405 02/09/20 0423 02/10/20 0152 02/11/20 0456  AST 51* 47* 53* 45*  ALT 31 33 37 38  ALKPHOS 66 48 46 52  BILITOT 0.4 0.6 0.8 0.9  PROT 6.2* 5.2* 5.1* 5.2*  ALBUMIN 3.1* 2.8* 3.0* 2.9*    HbA1C: Hgb A1c MFr Bld  Date/Time Value Ref Range Status  02/07/2020 04:05 AM 7.7 (H) 4.8 - 5.6 % Final    Comment:    (NOTE) Pre diabetes:          5.7%-6.4%  Diabetes:              >6.4%  Glycemic control for   <7.0% adults with  diabetes     CBG: Recent Labs  Lab 02/10/20 1215 02/10/20 1657 02/10/20 1743 02/10/20 2140 02/11/20 0741  GLUCAP 107* 64* 200* 101* 84  Recent Results (from the past 240 hour(s))  Blood Culture (routine x 2)     Status: Abnormal   Collection Time: 02/05/20 11:28 AM   Specimen: BLOOD  Result Value Ref Range Status   Specimen Description   Final    BLOOD LEFT ANTECUBITAL Performed at [facility], [place] [address], [place], [place] [address]    Special Requests   Final    BOTTLES DRAWN AEROBIC AND ANAEROBIC Blood Culture adequate volume Performed at [facility] [address] [place], [place] [address]    Culture  Setup Time   Final    GRAM POSITIVE COCCI IN CLUSTERS AEROBIC BOTTLE ONLY CRITICAL RESULT CALLED TO, READ BACK BY AND VERIFIED WITH: J,GADHIA PHARMD @1811  02/06/20 EB    Culture (A)  Final    ROTHIA MUCILAGINOSA THE SIGNIFICANCE OF ISOLATING THIS ORGANISM FROM A SINGLE SET OF BLOOD CULTURES WHEN MULTIPLE SETS ARE DRAWN IS UNCERTAIN. PLEASE NOTIFY THE MICROBIOLOGY DEPARTMENT WITHIN ONE WEEK IF SPECIATION AND SENSITIVITIES ARE REQUIRED. Performed at [facility], [place] [address], [place], [place] [address]    Report Status 02/08/2020 FINAL  Final  Blood Culture ID Panel (Reflexed)     Status: None   Collection Time: 02/05/20 11:28 AM  Result Value Ref Range Status   Enterococcus faecalis NOT DETECTED NOT DETECTED Final   Enterococcus Faecium NOT DETECTED NOT DETECTED Final   Listeria monocytogenes NOT DETECTED NOT DETECTED Final   Staphylococcus species NOT DETECTED NOT DETECTED Final   Staphylococcus aureus (BCID) NOT DETECTED NOT DETECTED Final   Staphylococcus epidermidis NOT DETECTED NOT DETECTED Final   Staphylococcus lugdunensis NOT DETECTED NOT DETECTED Final   Streptococcus species NOT DETECTED NOT DETECTED Final   Streptococcus agalactiae NOT DETECTED NOT DETECTED Final   Streptococcus pneumoniae NOT DETECTED NOT  DETECTED Final   Streptococcus pyogenes NOT DETECTED NOT DETECTED Final   A.calcoaceticus-baumannii NOT DETECTED NOT DETECTED Final   Bacteroides fragilis NOT DETECTED NOT DETECTED Final   Enterobacterales NOT DETECTED NOT DETECTED Final   Enterobacter cloacae complex NOT DETECTED NOT DETECTED Final   Escherichia coli NOT DETECTED NOT DETECTED Final   Klebsiella aerogenes NOT DETECTED NOT DETECTED Final   Klebsiella oxytoca NOT DETECTED NOT DETECTED Final   Klebsiella pneumoniae NOT DETECTED NOT DETECTED Final   Proteus species NOT DETECTED NOT DETECTED Final   Salmonella species NOT DETECTED NOT DETECTED Final   Serratia marcescens NOT DETECTED NOT DETECTED Final   Haemophilus influenzae NOT DETECTED NOT DETECTED Final   Neisseria meningitidis NOT DETECTED NOT DETECTED Final   Pseudomonas aeruginosa NOT DETECTED NOT DETECTED Final   Stenotrophomonas maltophilia NOT DETECTED NOT DETECTED Final   Candida albicans NOT DETECTED NOT DETECTED Final   Candida auris NOT DETECTED NOT DETECTED Final   Candida glabrata NOT DETECTED NOT DETECTED Final   Candida krusei NOT DETECTED NOT DETECTED Final   Candida parapsilosis NOT DETECTED NOT DETECTED Final   Candida tropicalis NOT DETECTED NOT DETECTED Final   Cryptococcus neoformans/gattii NOT DETECTED NOT DETECTED Final    Comment: Performed at [facility], [place] [address], [place], [place] [address]  Blood Culture (routine x 2)     Status: None   Collection Time: 02/05/20 11:33 AM   Specimen: BLOOD  Result Value Ref Range Status   Specimen Description   Final    BLOOD LEFT ANTECUBITAL Performed at [facility], [place] [address] [place], [place] [address]    Special Requests  Final    BOTTLES DRAWN AEROBIC AND ANAEROBIC Blood Culture adequate volume Performed at [facility] [address], [place], [place] [address]    Culture   Final    NO GROWTH 5 DAYS Performed at [facility], [place] [address], [place], [place] [address]    Report Status 02/10/2020 FINAL  Final     Scheduled Meds: . sodium chloride   Intravenous Once  . vitamin C  500 mg Oral Daily  . baricitinib  1 mg Oral Daily  . insulin aspart  0-15 Units Subcutaneous TID WC  . insulin detemir  10 Units Subcutaneous BID  . Ipratropium-Albuterol  1 puff Inhalation BID  . levothyroxine  50 mcg Oral Q0600  . linagliptin  5 mg Oral Daily  . loratadine  10 mg Oral Daily  . mouth rinse  15 mL Mouth Rinse BID  . montelukast  10 mg Oral QHS  . multivitamin with minerals  1 tablet Oral Daily  . pantoprazole (PROTONIX) IV  40 mg Intravenous Q12H  . predniSONE  50 mg Oral Q breakfast  . zinc sulfate  220 mg Oral Daily   Continuous Infusions: . dextrose 5 % and 0.9% NaCl 75 mL/hr at 02/11/20 2836     LOS: 6 days   Cherene Altes, MD [facility] Office  [phone] Pager - Text Page per Shea Evans  If 7PM-7AM, please contact night-coverage per Amion 02/11/2020, 9:49 AM

## 2020-02-12 LAB — GLUCOSE, CAPILLARY
Glucose-Capillary: 78 mg/dL (ref 70–99)
Glucose-Capillary: 62 mg/dL — ABNORMAL LOW (ref 70–99)
Glucose-Capillary: 82 mg/dL (ref 70–99)
Glucose-Capillary: 46 mg/dL — ABNORMAL LOW (ref 70–99)
Glucose-Capillary: 49 mg/dL — ABNORMAL LOW (ref 70–99)
Glucose-Capillary: 195 mg/dL — ABNORMAL HIGH (ref 70–99)
Glucose-Capillary: 248 mg/dL — ABNORMAL HIGH (ref 70–99)
Glucose-Capillary: 143 mg/dL — ABNORMAL HIGH (ref 70–99)

## 2020-02-12 LAB — CBC
HCT: 37.6 % (ref 36.0–46.0)
Hemoglobin: 12.2 g/dL (ref 12.0–15.0)
MCH: 29.2 pg (ref 26.0–34.0)
MCHC: 32.4 g/dL (ref 30.0–36.0)
MCV: 90 fL (ref 80.0–100.0)
Platelets: 151 10*3/uL (ref 150–400)
RBC: 4.18 MIL/uL (ref 3.87–5.11)
RDW: 16.3 % — ABNORMAL HIGH (ref 11.5–15.5)
WBC: 19.3 10*3/uL — ABNORMAL HIGH (ref 4.0–10.5)
nRBC: 0.3 % — ABNORMAL HIGH (ref 0.0–0.2)

## 2020-02-12 LAB — COMPREHENSIVE METABOLIC PANEL
ALT: 38 U/L (ref 0–44)
AST: 42 U/L — ABNORMAL HIGH (ref 15–41)
Albumin: 3 g/dL — ABNORMAL LOW (ref 3.5–5.0)
Alkaline Phosphatase: 54 U/L (ref 38–126)
Anion gap: 10 (ref 5–15)
BUN: 43 mg/dL — ABNORMAL HIGH (ref 8–23)
CO2: 22 mmol/L (ref 22–32)
Calcium: 8.7 mg/dL — ABNORMAL LOW (ref 8.9–10.3)
Chloride: 111 mmol/L (ref 98–111)
Creatinine, Ser: 1.61 mg/dL — ABNORMAL HIGH (ref 0.44–1.00)
GFR calc Af Amer: 34 mL/min — ABNORMAL LOW (ref >60)
GFR calc non Af Amer: 29 mL/min — ABNORMAL LOW (ref >60)
Glucose, Bld: 108 mg/dL — ABNORMAL HIGH (ref 70–99)
Potassium: 3.8 mmol/L (ref 3.5–5.1)
Sodium: 143 mmol/L (ref 135–145)
Total Bilirubin: 1.3 mg/dL — ABNORMAL HIGH (ref 0.3–1.2)
Total Protein: 5.8 g/dL — ABNORMAL LOW (ref 6.5–8.1)

## 2020-02-12 MED ORDER — TRAMADOL HCL 50 MG PO TABS
50.0000 mg | ORAL_TABLET | Freq: Four times a day (QID) | ORAL | Status: DC | PRN
  Administered 2020-02-12 – 2020-02-17 (×6): 50 mg via ORAL
  Filled 2020-02-12 (×6): qty 1

## 2020-02-12 MED ORDER — DEXTROSE 50 % IV SOLN
25.0000 g | INTRAVENOUS | Status: DC | PRN
  Administered 2020-02-13: 25 g via INTRAVENOUS
  Filled 2020-02-12 (×2): qty 50

## 2020-02-12 MED ORDER — FENTANYL CITRATE (PF) 100 MCG/2ML IJ SOLN
25.0000 ug | INTRAMUSCULAR | Status: DC | PRN
  Administered 2020-02-12: 25 ug via INTRAVENOUS
  Filled 2020-02-12 (×2): qty 2

## 2020-02-12 MED ORDER — OXYCODONE HCL 5 MG PO TABS
5.0000 mg | ORAL_TABLET | ORAL | Status: DC | PRN
  Administered 2020-02-12 – 2020-02-18 (×2): 5 mg via ORAL
  Filled 2020-02-12 (×2): qty 1

## 2020-02-12 MED ORDER — DEXTROSE 50 % IV SOLN
25.0000 g | INTRAVENOUS | Status: AC
  Administered 2020-02-12: 25 g via INTRAVENOUS
  Filled 2020-02-12: qty 50

## 2020-02-12 NOTE — Progress Notes (Signed)
Blood sugar 46 rechecked. Notified MD, new orders for Dextrose fluids increase, and hypoglycemic protocol initiated and given Dextrose 50 per protocol. Pt vitals checked BP was 140/78 on manual check. Pt complained of 9 out of 10 pain level. Fentanyl and Tramadol given for pain. Will reevaluate pt's pain level.  BS rechecked and was 198. Pt resting comfortably. Will continue to monitor.

## 2020-02-12 NOTE — Progress Notes (Signed)
Blood sugar rechecked and was 82. Pt resting comfortably this time. Will monitor.

## 2020-02-12 NOTE — Progress Notes (Signed)
Patient resting quietly at this time. Pain reassessed and patient states her pain is "medium" or 5/10 and states "it does feel better". Will continue to monitor.

## 2020-02-12 NOTE — Plan of Care (Signed)
  Problem: Pain Managment: Goal: General experience of comfort will improve Outcome: Progressing   Problem: Safety: Goal: Ability to remain free from injury will improve Outcome: Progressing   

## 2020-02-12 NOTE — Progress Notes (Signed)
Blood sugar 68 on lunch CBG check. Apple juice given to patient and applesauce. Pt tolerated fair. Pt is asymptomatic. No aspiration. Will recheck in 15.

## 2020-02-12 NOTE — Progress Notes (Signed)
Inpatient Diabetes Program Recommendations  AACE/ADA: New Consensus Statement on Inpatient Glycemic Control (2015)  Target Ranges:  Prepandial:   less than 140 mg/dL      Peak postprandial:   less than 180 mg/dL (1-2 hours)      Critically ill patients:  140 - 180 mg/dL   Results for KAMA, CAMMARANO (MRN [id number]) as of 02/12/2020 12:09  Ref. Range 02/11/2020 07:41 02/11/2020 11:09 02/11/2020 16:39 02/11/2020 20:34  Glucose-Capillary Latest Ref Range: 70 - 99 mg/dL 84 111 (H)    10 units LEVEMIR @10am  182 (H)  3 units NOVOLOG  189 (H)    10 units LEVEMIR @10pm    Results for ZANIAH, TITTERINGTON (MRN [id number]) as of 02/12/2020 12:09  Ref. Range 02/12/2020 07:42 02/12/2020 11:22 02/12/2020 11:39  Glucose-Capillary Latest Ref Range: 70 - 99 mg/dL 78 62 (L)  10 units LEVEMIR  82     Home DM Meds: Amaryl 4 mg Daily  Current Orders: Levemir 10 units BID      Novolog Moderate Correction Scale/ SSI (0-15 units) TID AC      MD- Note patient with Hypoglycemia at 11am today (CBG 62).  Please consider reducing Levemir to 10 units Daily  Please stop the Bedtime dose for tonight     --Will follow patient during hospitalization--  Wyn Quaker RN, MSN, CDE Diabetes Coordinator Inpatient Glycemic Control Team Team Pager: [phone] (8a-5p)

## 2020-02-12 NOTE — Progress Notes (Signed)
Spoke to pt's daughter Arnold Long, who asked for an update on her mother.

## 2020-02-12 NOTE — Progress Notes (Signed)
Tami Richards  FTD:[id number] DOB: November 11, 1934 DOA: 02/05/2020 PCP: Sueanne Margarita, DO    Brief Narrative:  85 year old with a history of HOH, DM 2, CKD 4, hypothyroidism, HTN, and HLD who is unvaccinated against CoViD who was brought to the ED via EMS due to mental status changes and severe profound weakness.  According to the family the patient had been sick for approximately 1 week. She was tested 9/3 and found to be positive for Covid.  She received a Regeneron infusion 9/5.  When she presented for the infusion she was found to be hypotensive with systolics in the 06C.  She was treated with a bolus of fluid.  Oxygen saturations at that time varied between 84 to 96% on room air.  After volume resuscitation she had stabilized and was able to be discharged home.  After returning home unfortunately she continued to decline.  She developed low-grade fevers.  When she became confused the family brought her back to the ED.  Significant Events:  9/3 + CoViD testing 9/5 mAb infusion  9/6 admit   Date of Positive COVID Test:  02/02/20  Vaccination Status: NOT vaccinated   COVID-19 specific Treatment: Baricitinib 9/6 > Steroid 9/6 > Remdesivir 9/6 > 9/10  Antimicrobials:  None  DVT prophylaxis: Subcutaneous heparin  Subjective: Blood pressure holding stable as is hemoglobin.  Saturations favorable.  Renal function improving.  Has been having difficulty with poorly controlled pain in the abdomen today.  Her nurse reports she has been more alert and interactive today.  She is sedated at the time of my visit.  Assessment & Plan:  Hemorrhagic shock - RPH v/s occult GIB transfused 2 units of O- blood emergency release and subsequently 2 additional units of crossmatched blood - shock state resolved - CT abdomen confirmed a  large retroperitoneal hemorrhage - Hgb appears stable for now   Recent Labs  Lab 02/09/20 1945 02/10/20 0152 02/10/20 1056 02/11/20 0456 02/12/20 0507  HGB  10.7* 9.8* 8.4* 11.0* 12.2     Coagulopathy Patient received her last dose of subcu heparin at approximately 6 AM 9/10 -despite this her PTT in the afternoon 9/10 remained quite elevated at approximately 85 -given her life-threatening bleeding I dosed her with max dose protamine -her PTT has corrected and is remaining stable  Recent Labs  Lab 02/09/20 1540 02/09/20 1945 02/10/20 0152 02/10/20 1056 02/11/20 0456  APTT 84* 28 22* 29 29    COVID Pneumonia -acute hypoxic respiratory failure Received Regeneron 9/5 -has completed a course of remdesivir - much more stable at this time, but inolerant of oral intake - stopped baricitinib - hold steroid for now given -appears clinically stable from a respiratory standpoint presently  Acute metabolic encephalopathy Appears to have simply been related to acute illness and dehydration initially, and not due to recovery from shock state as well as narcotic pain meds -transition to oral narcotics as able  CKD stage IV Baseline creatinine appears to be approximately 2.2 -creatinine bumped up post shock state but appears to be improving again at this time  DM2 Well-controlled in setting of very poor intake  Essential HTN Not presently an issue  HLD Continue atorvastatin  Hypothyroidism Continue levothyroxine  Code Status: FULL CODE Family Communication:  Status is: Inpatient  Remains inpatient appropriate because:Inpatient level of care appropriate due to severity of illness   Dispo: The patient is from: Home              Anticipated d/c is to:  Home              Anticipated d/c date is: 3 days              Patient currently is not medically stable to d/c.  Consultants:  none  Objective: Blood pressure (!) 150/55, pulse 92, temperature 97.6 F (36.4 C), temperature source Oral, resp. rate 18, height 5\' 6"  (1.676 m), weight 65.3 kg, SpO2 90 %.  Intake/Output Summary (Last 24 hours) at 02/12/2020 0946 Last data filed at 02/12/2020  0851 Gross per 24 hour  Intake 1623.48 ml  Output --  Net 1623.48 ml   Filed Weights   02/10/20 2300  Weight: 65.3 kg    Examination: General: No acute respiratory distress  Lungs: Fine diffuse crackles -no wheezing Cardiovascular: RRR -no murmur or rub Abdomen: Tender to palpation across right flank and right posterior -no severe ecchymosis or palpable hematoma -abdomen soft Extremities: No cyanosis or edema B lower extremities  CBC: Recent Labs  Lab 02/07/20 0405 02/07/20 0405 02/08/20 0405 02/08/20 0405 02/09/20 0423 02/09/20 1058 02/10/20 1056 02/11/20 0456 02/12/20 0507  WBC 9.8   < > 9.0   < > 11.1*   < > 11.8* 12.4* 19.3*  NEUTROABS 7.5  --  7.3  --  8.3*  --   --   --   --   HGB 12.3   < > 12.0   < > 9.9*   < > 8.4* 11.0* 12.2  HCT 37.7   < > 37.0   < > 30.9*   < > 24.5* 32.0* 37.6  MCV 90.6   < > 91.6   < > 92.8   < > 86.9 86.5 90.0  PLT 166   < > 177   < > 203   < > 136* 110* 151   < > = values in this interval not displayed.   Basic Metabolic Panel: Recent Labs  Lab 02/08/20 0405 02/08/20 0405 02/09/20 0423 02/09/20 0423 02/10/20 0152 02/11/20 0456 02/12/20 0507  NA 136   < > 136   < > 138 144 143  K 4.8   < > 4.9   < > 4.7 4.0 3.8  CL 109   < > 106   < > 110 113* 111  CO2 20*   < > 16*   < > 16* 22 22  GLUCOSE 197*   < > 242*   < > 215* 81 108*  BUN 62*   < > 77*   < > 84* 67* 43*  CREATININE 2.04*   < > 2.37*   < > 2.61* 2.08* 1.61*  CALCIUM 8.8*   < > 8.7*   < > 8.4* 8.7* 8.7*  MG 2.6*  --  2.7*  --  2.6*  --   --    < > = values in this interval not displayed.   GFR: Estimated Creatinine Clearance: 24.8 mL/min (A) (by C-G formula based on SCr of 1.61 mg/dL (H)).  Liver Function Tests: Recent Labs  Lab 02/09/20 0423 02/10/20 0152 02/11/20 0456 02/12/20 0507  AST 47* 53* 45* 42*  ALT 33 37 38 38  ALKPHOS 48 46 52 54  BILITOT 0.6 0.8 0.9 1.3*  PROT 5.2* 5.1* 5.2* 5.8*  ALBUMIN 2.8* 3.0* 2.9* 3.0*    HbA1C: Hgb A1c MFr Bld   Date/Time Value Ref Range Status  02/07/2020 04:05 AM 7.7 (H) 4.8 - 5.6 % Final    Comment:    (NOTE)  Pre diabetes:          5.7%-6.4%  Diabetes:              >6.4%  Glycemic control for   <7.0% adults with diabetes     CBG: Recent Labs  Lab 02/11/20 0741 02/11/20 1109 02/11/20 1639 02/11/20 2034 02/12/20 0742  GLUCAP 84 111* 182* 189* 78    Recent Results (from the past 240 hour(s))  Blood Culture (routine x 2)     Status: Abnormal   Collection Time: 02/05/20 11:28 AM   Specimen: BLOOD  Result Value Ref Range Status   Specimen Description   Final    BLOOD LEFT ANTECUBITAL Performed at [facility], [place] [address] [place], [place] [address]    Special Requests   Final    BOTTLES DRAWN AEROBIC AND ANAEROBIC Blood Culture adequate volume Performed at [facility] [address], [place], [place] [address]    Culture  Setup Time   Final    GRAM POSITIVE COCCI IN CLUSTERS AEROBIC BOTTLE ONLY CRITICAL RESULT CALLED TO, READ BACK BY AND VERIFIED WITH: J,GADHIA PHARMD @1811  02/06/20 EB    Culture (A)  Final    ROTHIA MUCILAGINOSA THE SIGNIFICANCE OF ISOLATING THIS ORGANISM FROM A SINGLE SET OF BLOOD CULTURES WHEN MULTIPLE SETS ARE DRAWN IS UNCERTAIN. PLEASE NOTIFY THE MICROBIOLOGY DEPARTMENT WITHIN ONE WEEK IF SPECIATION AND SENSITIVITIES ARE REQUIRED. Performed at [facility], [place] [address] [place],  [address]    Report Status 02/08/2020 FINAL  Final  Blood Culture ID Panel (Reflexed)     Status: None   Collection Time: 02/05/20 11:28 AM  Result Value Ref Range Status   Enterococcus faecalis NOT DETECTED NOT DETECTED Final   Enterococcus Faecium NOT DETECTED NOT DETECTED Final   Listeria monocytogenes NOT DETECTED NOT DETECTED Final   Staphylococcus species NOT DETECTED NOT DETECTED Final   Staphylococcus aureus (BCID) NOT DETECTED NOT DETECTED Final   Staphylococcus epidermidis NOT DETECTED NOT DETECTED Final    Staphylococcus lugdunensis NOT DETECTED NOT DETECTED Final   Streptococcus species NOT DETECTED NOT DETECTED Final   Streptococcus agalactiae NOT DETECTED NOT DETECTED Final   Streptococcus pneumoniae NOT DETECTED NOT DETECTED Final   Streptococcus pyogenes NOT DETECTED NOT DETECTED Final   A.calcoaceticus-baumannii NOT DETECTED NOT DETECTED Final   Bacteroides fragilis NOT DETECTED NOT DETECTED Final   Enterobacterales NOT DETECTED NOT DETECTED Final   Enterobacter cloacae complex NOT DETECTED NOT DETECTED Final   Escherichia coli NOT DETECTED NOT DETECTED Final   Klebsiella aerogenes NOT DETECTED NOT DETECTED Final   Klebsiella oxytoca NOT DETECTED NOT DETECTED Final   Klebsiella pneumoniae NOT DETECTED NOT DETECTED Final   Proteus species NOT DETECTED NOT DETECTED Final   Salmonella species NOT DETECTED NOT DETECTED Final   Serratia marcescens NOT DETECTED NOT DETECTED Final   Haemophilus influenzae NOT DETECTED NOT DETECTED Final   Neisseria meningitidis NOT DETECTED NOT DETECTED Final   Pseudomonas aeruginosa NOT DETECTED NOT DETECTED Final   Stenotrophomonas maltophilia NOT DETECTED NOT DETECTED Final   Candida albicans NOT DETECTED NOT DETECTED Final   Candida auris NOT DETECTED NOT DETECTED Final   Candida glabrata NOT DETECTED NOT DETECTED Final   Candida krusei NOT DETECTED NOT DETECTED Final   Candida parapsilosis NOT DETECTED NOT DETECTED Final   Candida tropicalis NOT DETECTED NOT DETECTED Final   Cryptococcus neoformans/gattii NOT DETECTED NOT DETECTED Final    Comment: Performed at [facility], [address] [place], [place] [address]  Blood Culture (routine x 2)     Status: None   Collection Time: 02/05/20 11:33 AM   Specimen: BLOOD  Result Value Ref Range Status   Specimen Description   Final    BLOOD LEFT ANTECUBITAL Performed at [facility], [place] [address] [place], [place] [address]    Special Requests   Final    BOTTLES DRAWN AEROBIC AND  ANAEROBIC Blood Culture adequate volume Performed at [facility] [address], [place], [place] [address]    Culture   Final    NO GROWTH 5 DAYS Performed at [facility], [place] [address], [place], [place] [address]    Report Status 02/10/2020 FINAL  Final     Scheduled Meds:  insulin aspart  0-15 Units Subcutaneous TID WC   insulin detemir  10 Units Subcutaneous BID   levothyroxine  50 mcg Oral Q0600   mouth rinse  15 mL Mouth Rinse BID   montelukast  10 mg Oral QHS   Continuous Infusions:  dextrose 5 % and 0.9% NaCl 75 mL/hr at 02/12/20 0600     LOS: 7 days   Cherene Altes, MD [facility] Office  [phone] Pager - Text Page per Shea Evans  If 7PM-7AM, please contact night-coverage per Amion 02/12/2020, 9:46 AM

## 2020-02-12 NOTE — Care Management Important Message (Signed)
Important Message  Patient Details IM Letter given to the Patient Name: Tami Richards MRN: [id number] Date of Birth: 1934/07/08   Medicare Important Message Given:  Yes     [name] 02/12/2020, 10:36 AM

## 2020-02-12 NOTE — Plan of Care (Signed)

## 2020-02-12 NOTE — Progress Notes (Signed)
Pt has received 2x doses of PRN fetanyl this AM without relief. Pt constantly complaining of generalized pain/hip area. MD [facility] notified and aware. New pain PRN meds ordered. Katie LPN, administered PO oxycodone for patient. Pt tolerated well. This RN and Joellen Jersey will continue to monitor patient.

## 2020-02-12 NOTE — Progress Notes (Signed)
Blood sugar 49 at dinner CBG check. Cranberry juice given to patient and applesauce. Pt tolerated fair. Pt is asymptomatic. No aspiration. Will recheck in 15.

## 2020-02-13 LAB — BASIC METABOLIC PANEL
Anion gap: 10 (ref 5–15)
BUN: 32 mg/dL — ABNORMAL HIGH (ref 8–23)
CO2: 23 mmol/L (ref 22–32)
Calcium: 8.5 mg/dL — ABNORMAL LOW (ref 8.9–10.3)
Chloride: 110 mmol/L (ref 98–111)
Creatinine, Ser: 1.57 mg/dL — ABNORMAL HIGH (ref 0.44–1.00)
GFR calc Af Amer: 35 mL/min — ABNORMAL LOW (ref >60)
GFR calc non Af Amer: 30 mL/min — ABNORMAL LOW (ref >60)
Glucose, Bld: 45 mg/dL — ABNORMAL LOW (ref 70–99)
Potassium: 3.8 mmol/L (ref 3.5–5.1)
Sodium: 143 mmol/L (ref 135–145)

## 2020-02-13 LAB — GLUCOSE, CAPILLARY
Glucose-Capillary: 62 mg/dL — ABNORMAL LOW (ref 70–99)
Glucose-Capillary: 74 mg/dL (ref 70–99)
Glucose-Capillary: 87 mg/dL (ref 70–99)
Glucose-Capillary: 47 mg/dL — ABNORMAL LOW (ref 70–99)
Glucose-Capillary: 74 mg/dL (ref 70–99)
Glucose-Capillary: 159 mg/dL — ABNORMAL HIGH (ref 70–99)

## 2020-02-13 LAB — CBC
HCT: 34.8 % — ABNORMAL LOW (ref 36.0–46.0)
Hemoglobin: 11.2 g/dL — ABNORMAL LOW (ref 12.0–15.0)
MCH: 29.3 pg (ref 26.0–34.0)
MCHC: 32.2 g/dL (ref 30.0–36.0)
MCV: 91.1 fL (ref 80.0–100.0)
Platelets: 224 10*3/uL (ref 150–400)
RBC: 3.82 MIL/uL — ABNORMAL LOW (ref 3.87–5.11)
RDW: 16.4 % — ABNORMAL HIGH (ref 11.5–15.5)
WBC: 15.2 10*3/uL — ABNORMAL HIGH (ref 4.0–10.5)
nRBC: 0.1 % (ref 0.0–0.2)

## 2020-02-13 MED ORDER — INSULIN ASPART 100 UNIT/ML ~~LOC~~ SOLN
0.0000 [IU] | Freq: Three times a day (TID) | SUBCUTANEOUS | Status: DC
  Administered 2020-02-14: 2 [IU] via SUBCUTANEOUS
  Administered 2020-02-14 (×2): 1 [IU] via SUBCUTANEOUS
  Administered 2020-02-15: 2 [IU] via SUBCUTANEOUS
  Administered 2020-02-15: 3 [IU] via SUBCUTANEOUS
  Administered 2020-02-16 – 2020-02-18 (×5): 2 [IU] via SUBCUTANEOUS
  Administered 2020-02-18: 7 [IU] via SUBCUTANEOUS
  Administered 2020-02-19: 3 [IU] via SUBCUTANEOUS

## 2020-02-13 MED ORDER — FENTANYL CITRATE (PF) 100 MCG/2ML IJ SOLN: 25.0000 ug | INTRAMUSCULAR | Status: DC | PRN

## 2020-02-13 NOTE — Progress Notes (Signed)
Inpatient Diabetes Program Recommendations  AACE/ADA: New Consensus Statement on Inpatient Glycemic Control (2015)  Target Ranges:  Prepandial:   less than 140 mg/dL      Peak postprandial:   less than 180 mg/dL (1-2 hours)      Critically ill patients:  140 - 180 mg/dL    Results for SMITA, LESH (MRN [id number]) as of 02/13/2020 11:16  Ref. Range 02/11/2020 20:34 02/12/2020 07:42 02/12/2020 11:22 02/12/2020 11:39 02/12/2020 16:55 02/12/2020 17:17 02/12/2020 17:55 02/12/2020 18:08 02/12/2020 21:23 02/13/2020 07:46 02/13/2020 08:02  Glucose-Capillary Latest Ref Range: 70 - 99 mg/dL 189 (H) 78 62 (L) 82 49 (L) 46 (L) 248 (H) 195 (H) 143 (H) 62 (L) 74     Home DM Meds: Amaryl 4 mg Daily  Current Orders: Levemir 10 units BID      Novolog Moderate Correction Scale/ SSI (0-15 units) TID AC      MD- Note patient with Hypoglycemia past 2 mornings  Please consider reducing Levemir to 10 units Daily (by half)      --Will follow patient during hospitalization--  Tama Headings RN, MSN, BC-ADM Inpatient Diabetes Coordinator Team Pager [phone] (8a-5p)

## 2020-02-13 NOTE — Progress Notes (Signed)
Tami Richards  FYB:[id number] DOB: [birth year] DOA: 02/05/2020 PCP: Sueanne Margarita, DO    Brief Narrative:  85 year old with a history of HOH, DM 2, CKD 4, hypothyroidism, HTN, and HLD who is unvaccinated against CoViD who was brought to the ED via EMS due to mental status changes and severe profound weakness.  According to the family the patient had been sick for approximately 1 week. She was tested 9/3 and found to be positive for Covid.  She received a Regeneron infusion 9/5.  When she presented for the infusion she was found to be hypotensive with systolics in the 52D.  She was treated with a bolus of fluid.  Oxygen saturations at that time varied between 84 to 96% on room air.  After volume resuscitation she had stabilized and was able to be discharged home.  After returning home unfortunately she continued to decline.  She developed low-grade fevers.  When she became confused the family brought her back to the ED.  Significant Events:  9/3 + CoViD testing 9/5 mAb infusion  9/6 admit   Date of Positive COVID Test:  02/02/20  Vaccination Status: NOT vaccinated   COVID-19 specific Treatment: Baricitinib 9/6 > Steroid 9/6 > Remdesivir 9/6 > 9/10  Antimicrobials:  None  DVT prophylaxis: Subcutaneous heparin  Subjective: Blood pressure stable.  Temperature slightly elevated at 99.1 but no true fever thus far.  Saturation 94% on room air.  Some intermittent episodes of hypoglycemia persist.  Much more alert and interactive today.  Is conversant and able to tell me that she is in the hospital.  Does not remember the events of the last few days.  Assessment & Plan:  Hemorrhagic shock - RPH v/s occult GIB transfused 2 units of O- blood emergency release and subsequently 2 additional units of crossmatched blood - shock state resolved - CT abdomen confirmed a  large retroperitoneal hemorrhage - Hgb appears stable at this time -monitor for concerns for infection of large  hematoma  Recent Labs  Lab 02/10/20 0152 02/10/20 1056 02/11/20 0456 02/12/20 0507 02/13/20 0619  HGB 9.8* 8.4* 11.0* 12.2 11.2*     Coagulopathy Patient received her last dose of subcu heparin at approximately 6 AM 9/10 -despite this her PTT in the afternoon 9/10 remained quite elevated at approximately 85 -given her life-threatening bleeding I dosed her with max dose protamine -her PTT has corrected and is remaining stable  COVID Pneumonia -acute hypoxic respiratory failure Received Regeneron 9/5 -has completed a course of remdesivir - stopped baricitinib while patient was intolerant of oral intake- appears clinically stable from a respiratory standpoint presently - mobilize   Acute metabolic encephalopathy Appears to have simply been related to acute illness and dehydration initially, and now due to recovery from shock state as well as narcotic pain meds -transition to oral narcotics as able -much improved today  CKD stage IV Baseline creatinine appears to be approximately 2.2 -creatinine bumped up post shock state but appears to be improving again at this time and is actually better than her suspected baseline  Recent Labs  Lab 02/09/20 0423 02/10/20 0152 02/11/20 0456 02/12/20 0507 02/13/20 0619  CREATININE 2.37* 2.61* 2.08* 1.61* 1.57*     DM2 Intermittent episodes of hypoglycemia due to poor intake - continue to monitor trend  Essential HTN Not presently an issue  HLD Continue atorvastatin when intake improved  Hypothyroidism Continue levothyroxine  Code Status: FULL CODE Family Communication:  Status is: Inpatient  Remains inpatient appropriate because:Inpatient level of  care appropriate due to severity of illness   Dispo: The patient is from: Home              Anticipated d/c is to: Home              Anticipated d/c date is: 3 days              Patient currently is not medically stable to d/c.  Consultants:  none  Objective: Blood pressure  129/62, pulse 84, temperature 99.1 F (37.3 C), resp. rate 18, height 5\' 6"  (1.676 m), weight 65.3 kg, SpO2 94 %.  Intake/Output Summary (Last 24 hours) at 02/13/2020 0924 Last data filed at 02/13/2020 0834 Gross per 24 hour  Intake 2020.33 ml  Output 1200 ml  Net 820.33 ml   Filed Weights   02/10/20 2300  Weight: 65.3 kg    Examination: General: No acute respiratory distress -much more alert, pleasant, and interactive Lungs: Only very faint crackles with good air movement throughout with no wheezing Cardiovascular: RRR -no murmur or rub Abdomen: Tender to palpation across right flank and right posterior -no severe ecchymosis or palpable hematoma -abdomen soft, BS positive Extremities: No cyanosis or edema B LE  CBC: Recent Labs  Lab 02/07/20 0405 02/07/20 0405 02/08/20 0405 02/08/20 0405 02/09/20 0423 02/09/20 1058 02/11/20 0456 02/12/20 0507 02/13/20 0619  WBC 9.8   < > 9.0   < > 11.1*   < > 12.4* 19.3* 15.2*  NEUTROABS 7.5  --  7.3  --  8.3*  --   --   --   --   HGB 12.3   < > 12.0   < > 9.9*   < > 11.0* 12.2 11.2*  HCT 37.7   < > 37.0   < > 30.9*   < > 32.0* 37.6 34.8*  MCV 90.6   < > 91.6   < > 92.8   < > 86.5 90.0 91.1  PLT 166   < > 177   < > 203   < > 110* 151 224   < > = values in this interval not displayed.   Basic Metabolic Panel: Recent Labs  Lab 02/08/20 0405 02/08/20 0405 02/09/20 0423 02/09/20 0423 02/10/20 0152 02/10/20 0152 02/11/20 0456 02/12/20 0507 02/13/20 0619  NA 136   < > 136   < > 138   < > 144 143 143  K 4.8   < > 4.9   < > 4.7   < > 4.0 3.8 3.8  CL 109   < > 106   < > 110   < > 113* 111 110  CO2 20*   < > 16*   < > 16*   < > 22 22 23   GLUCOSE 197*   < > 242*   < > 215*   < > 81 108* 45*  BUN 62*   < > 77*   < > 84*   < > 67* 43* 32*  CREATININE 2.04*   < > 2.37*   < > 2.61*   < > 2.08* 1.61* 1.57*  CALCIUM 8.8*   < > 8.7*   < > 8.4*   < > 8.7* 8.7* 8.5*  MG 2.6*  --  2.7*  --  2.6*  --   --   --   --    < > = values in this  interval not displayed.   GFR: Estimated Creatinine Clearance: 25.4 mL/min (A) (by C-G formula  based on SCr of 1.57 mg/dL (H)).  Liver Function Tests: Recent Labs  Lab 02/09/20 0423 02/10/20 0152 02/11/20 0456 02/12/20 0507  AST 47* 53* 45* 42*  ALT 33 37 38 38  ALKPHOS 48 46 52 54  BILITOT 0.6 0.8 0.9 1.3*  PROT 5.2* 5.1* 5.2* 5.8*  ALBUMIN 2.8* 3.0* 2.9* 3.0*    HbA1C: Hgb A1c MFr Bld  Date/Time Value Ref Range Status  02/07/2020 04:05 AM 7.7 (H) 4.8 - 5.6 % Final    Comment:    (NOTE) Pre diabetes:          5.7%-6.4%  Diabetes:              >6.4%  Glycemic control for   <7.0% adults with diabetes     CBG: Recent Labs  Lab 02/12/20 1755 02/12/20 1808 02/12/20 2123 02/13/20 0746 02/13/20 0802  GLUCAP 248* 195* 143* 62* 74    Recent Results (from the past 240 hour(s))  Blood Culture (routine x 2)     Status: Abnormal   Collection Time: 02/05/20 11:28 AM   Specimen: BLOOD  Result Value Ref Range Status   Specimen Description   Final    BLOOD LEFT ANTECUBITAL Performed at [facility], [place] [address], [place], [place] [address]    Special Requests   Final    BOTTLES DRAWN AEROBIC AND ANAEROBIC Blood Culture adequate volume Performed at [place] [address], [place], [place] [address]    Culture  Setup Time   Final    GRAM POSITIVE COCCI IN CLUSTERS AEROBIC BOTTLE ONLY CRITICAL RESULT CALLED TO, READ BACK BY AND VERIFIED WITH: J,GADHIA PHARMD @1811  02/06/20 EB    Culture (A)  Final    ROTHIA MUCILAGINOSA THE SIGNIFICANCE OF ISOLATING THIS ORGANISM FROM A SINGLE SET OF BLOOD CULTURES WHEN MULTIPLE SETS ARE DRAWN IS UNCERTAIN. PLEASE NOTIFY THE MICROBIOLOGY DEPARTMENT WITHIN ONE WEEK IF SPECIATION AND SENSITIVITIES ARE REQUIRED. Performed at [facility], [place] [address] [place], [place] [address]    Report Status 02/08/2020 FINAL  Final  Blood Culture ID Panel (Reflexed)     Status: None   Collection Time:  02/05/20 11:28 AM  Result Value Ref Range Status   Enterococcus faecalis NOT DETECTED NOT DETECTED Final   Enterococcus Faecium NOT DETECTED NOT DETECTED Final   Listeria monocytogenes NOT DETECTED NOT DETECTED Final   Staphylococcus species NOT DETECTED NOT DETECTED Final   Staphylococcus aureus (BCID) NOT DETECTED NOT DETECTED Final   Staphylococcus epidermidis NOT DETECTED NOT DETECTED Final   Staphylococcus lugdunensis NOT DETECTED NOT DETECTED Final   Streptococcus species NOT DETECTED NOT DETECTED Final   Streptococcus agalactiae NOT DETECTED NOT DETECTED Final   Streptococcus pneumoniae NOT DETECTED NOT DETECTED Final   Streptococcus pyogenes NOT DETECTED NOT DETECTED Final   A.calcoaceticus-baumannii NOT DETECTED NOT DETECTED Final   Bacteroides fragilis NOT DETECTED NOT DETECTED Final   Enterobacterales NOT DETECTED NOT DETECTED Final   Enterobacter cloacae complex NOT DETECTED NOT DETECTED Final   Escherichia coli NOT DETECTED NOT DETECTED Final   Klebsiella aerogenes NOT DETECTED NOT DETECTED Final   Klebsiella oxytoca NOT DETECTED NOT DETECTED Final   Klebsiella pneumoniae NOT DETECTED NOT DETECTED Final   Proteus species NOT DETECTED NOT DETECTED Final   Salmonella species NOT DETECTED NOT DETECTED Final   Serratia marcescens NOT DETECTED NOT DETECTED Final   Haemophilus influenzae NOT DETECTED NOT DETECTED Final   Neisseria meningitidis NOT DETECTED NOT DETECTED Final  Pseudomonas aeruginosa NOT DETECTED NOT DETECTED Final   Stenotrophomonas maltophilia NOT DETECTED NOT DETECTED Final   Candida albicans NOT DETECTED NOT DETECTED Final   Candida auris NOT DETECTED NOT DETECTED Final   Candida glabrata NOT DETECTED NOT DETECTED Final   Candida krusei NOT DETECTED NOT DETECTED Final   Candida parapsilosis NOT DETECTED NOT DETECTED Final   Candida tropicalis NOT DETECTED NOT DETECTED Final   Cryptococcus neoformans/gattii NOT DETECTED NOT DETECTED Final    Comment:  Performed at [facility], [place] [address] [place], [place] [address]  Blood Culture (routine x 2)     Status: None   Collection Time: 02/05/20 11:33 AM   Specimen: BLOOD  Result Value Ref Range Status   Specimen Description   Final    BLOOD LEFT ANTECUBITAL Performed at [facility], [place] [address], [place], [place] [address]    Special Requests   Final    BOTTLES DRAWN AEROBIC AND ANAEROBIC Blood Culture adequate volume Performed at [place] [address], [place], [place] [address]    Culture   Final    NO GROWTH 5 DAYS Performed at [facility], [place] [address] [place],  [address]    Report Status 02/10/2020 FINAL  Final     Scheduled Meds: . insulin aspart  0-15 Units Subcutaneous TID WC  . insulin detemir  10 Units Subcutaneous BID  . levothyroxine  50 mcg Oral Q0600  . mouth rinse  15 mL Mouth Rinse BID  . montelukast  10 mg Oral QHS   Continuous Infusions: . dextrose 5 % and 0.9% NaCl 100 mL/hr at 02/13/20 0600     LOS: 8 days   Cherene Altes, MD [facility] Office  [phone] Pager - Text Page per Shea Evans  If 7PM-7AM, please contact night-coverage per Amion 02/13/2020, 9:24 AM

## 2020-02-14 DIAGNOSIS — R400 Somnolence: Secondary | ICD-10-CM

## 2020-02-14 DIAGNOSIS — E1121 Type 2 diabetes mellitus with diabetic nephropathy: Secondary | ICD-10-CM

## 2020-02-14 HISTORY — PX: CATARACT EXTRACTION W/ INTRAOCULAR LENS IMPLANT: SHX1309

## 2020-02-14 LAB — GLUCOSE, CAPILLARY
Glucose-Capillary: 121 mg/dL — ABNORMAL HIGH (ref 70–99)
Glucose-Capillary: 182 mg/dL — ABNORMAL HIGH (ref 70–99)
Glucose-Capillary: 143 mg/dL — ABNORMAL HIGH (ref 70–99)
Glucose-Capillary: 141 mg/dL — ABNORMAL HIGH (ref 70–99)
Glucose-Capillary: 143 mg/dL — ABNORMAL HIGH (ref 70–99)

## 2020-02-14 LAB — CBC WITH DIFFERENTIAL/PLATELET
Abs Immature Granulocytes: 0.15 10*3/uL — ABNORMAL HIGH (ref 0.00–0.07)
Basophils Absolute: 0 10*3/uL (ref 0.0–0.1)
Basophils Relative: 0 %
Eosinophils Absolute: 0.1 10*3/uL (ref 0.0–0.5)
Eosinophils Relative: 1 %
HCT: 38.4 % (ref 36.0–46.0)
Hemoglobin: 11.8 g/dL — ABNORMAL LOW (ref 12.0–15.0)
Immature Granulocytes: 1 %
Lymphocytes Relative: 6 %
Lymphs Abs: 0.8 10*3/uL (ref 0.7–4.0)
MCH: 28.9 pg (ref 26.0–34.0)
MCHC: 30.7 g/dL (ref 30.0–36.0)
MCV: 94.1 fL (ref 80.0–100.0)
Monocytes Absolute: 0.9 10*3/uL (ref 0.1–1.0)
Monocytes Relative: 6 %
Neutro Abs: 12.6 10*3/uL — ABNORMAL HIGH (ref 1.7–7.7)
Neutrophils Relative %: 86 %
Platelets: 310 10*3/uL (ref 150–400)
RBC: 4.08 MIL/uL (ref 3.87–5.11)
RDW: 16.3 % — ABNORMAL HIGH (ref 11.5–15.5)
WBC: 14.6 10*3/uL — ABNORMAL HIGH (ref 4.0–10.5)
nRBC: 0 % (ref 0.0–0.2)

## 2020-02-14 LAB — COMPREHENSIVE METABOLIC PANEL
ALT: 35 U/L (ref 0–44)
AST: 29 U/L (ref 15–41)
Albumin: 2.7 g/dL — ABNORMAL LOW (ref 3.5–5.0)
Alkaline Phosphatase: 64 U/L (ref 38–126)
Anion gap: 12 (ref 5–15)
BUN: 22 mg/dL (ref 8–23)
CO2: 21 mmol/L — ABNORMAL LOW (ref 22–32)
Calcium: 8.8 mg/dL — ABNORMAL LOW (ref 8.9–10.3)
Chloride: 106 mmol/L (ref 98–111)
Creatinine, Ser: 1.35 mg/dL — ABNORMAL HIGH (ref 0.44–1.00)
GFR calc Af Amer: 42 mL/min — ABNORMAL LOW (ref >60)
GFR calc non Af Amer: 36 mL/min — ABNORMAL LOW (ref >60)
Glucose, Bld: 200 mg/dL — ABNORMAL HIGH (ref 70–99)
Potassium: 3.8 mmol/L (ref 3.5–5.1)
Sodium: 139 mmol/L (ref 135–145)
Total Bilirubin: 1.8 mg/dL — ABNORMAL HIGH (ref 0.3–1.2)
Total Protein: 5.9 g/dL — ABNORMAL LOW (ref 6.5–8.1)

## 2020-02-14 LAB — PHOSPHORUS: Phosphorus: 1.6 mg/dL — ABNORMAL LOW (ref 2.5–4.6)

## 2020-02-14 LAB — D-DIMER, QUANTITATIVE: D-Dimer, Quant: 13.67 ug/mL-FEU — ABNORMAL HIGH (ref 0.00–0.50)

## 2020-02-14 LAB — C-REACTIVE PROTEIN: CRP: 13.6 mg/dL — ABNORMAL HIGH (ref <1.0)

## 2020-02-14 LAB — LACTATE DEHYDROGENASE: LDH: 288 U/L — ABNORMAL HIGH (ref 98–192)

## 2020-02-14 LAB — MAGNESIUM: Magnesium: 2 mg/dL (ref 1.7–2.4)

## 2020-02-14 LAB — FERRITIN: Ferritin: 568 ng/mL — ABNORMAL HIGH (ref 11–307)

## 2020-02-14 NOTE — Progress Notes (Signed)
Occupational Therapy Treatment Patient Details Name: Tami Richards MRN: [id number] DOB: Oct 13, 1934 Today's Date: 02/14/2020    History of present illness 85 year old female who is hard of hearing and a poor historian with a past medical history of diabetes, CKD 4, hypothyroidism, hypertension and hyperlipidemia who is unvaccinated against COVID-19 who was brought in by EMS from home for change in mental status and weakness. She is being treated for COVID. Patient s/p rapid response for hemorrhagic shock on 9/10 and required emergent PRBC transfusions. Therapy reorderd 9/14.   OT comments  Patient presents on 1 L [place] supine in bed when therapist entered the room. Treatment focused on improving patient's activity tolerance, safe mobility and ADLs in order for patient to improve independence. Patient min guard with RW to to transfer to [facility], to take steps to recliner and to stand at sink for grooming. Patient had one LOB standing at sink requiring assistance of therapist to recover. Patient min guard for toileting and able to don shoes in seated position. Patient's abilities near where she was on initial evaluation. Educated patient on need of close assistance/supervision at home secondary to poor balance - needing use of RW and shower chair for safety. Patient verbalized understanding. Patient's o2 sats WFL during treatment. Cont POC.    Follow Up Recommendations  No OT follow up    Equipment Recommendations  None recommended by OT    Recommendations for Other Services      Precautions / Restrictions Precautions Precautions: Fall Precaution Comments: Sudden LOBs - hands on for safety Restrictions Weight Bearing Restrictions: No       Mobility Bed Mobility Overal bed mobility: Needs Assistance Bed Mobility: Supine to Sit Rolling: Supervision         General bed mobility comments: Supervision to transfer to side of bed.  Transfers Overall transfer level: Needs  assistance Equipment used: Rolling walker (2 wheeled) Transfers: Sit to/from [employer] Sit to Stand: Min guard Stand pivot transfers: Min guard       General transfer comment: Min guard for standing and min guard for transfers. One major LOB standing at the sink.    Balance Overall balance assessment: Needs assistance Sitting-balance support: No upper extremity supported;Feet supported Sitting balance-Leahy Scale: Good     Standing balance support: Single extremity supported Standing balance-Leahy Scale: Poor Standing balance comment: Required external support to maintain balance. Use of hands on walker needed. had one major fall to the right when taking hand off of sink that therapist had to assist with correcting.                           ADL either performed or assessed with clinical judgement   ADL Overall ADL's : Needs assistance/impaired Eating/Feeding: Independent   Grooming: Standing;Wash/dry hands;Min guard Grooming Details (indicate cue type and reason): Patient stood at sink to wash hands with min guard. Upper Body Bathing: Set up;Sitting   Lower Body Bathing: Set up;Sit to/from stand;Min guard   Upper Body Dressing : Set up;Sitting   Lower Body Dressing: Set up;Sit to/from stand;Min guard   Toilet Transfer: Min Teaching laboratory technician Details (indicate cue type and reason): BSC placed on patient's right. Used Rw to stand pivot to [facility]. Toileting- Clothing Manipulation and Hygiene: Sit to/from stand;Minimal assistance;Min guard Toileting - Clothing Manipulation Details (indicate cue type and reason): Min guard for toileting. patient able to wipe and manage clothing with min assist.     Functional  mobility during ADLs: Min guard;Rolling walker       Vision Patient Visual Report: No change from baseline     Perception     Praxis      Cognition Arousal/Alertness: Awake/alert Behavior During Therapy: WFL for  tasks assessed/performed Overall Cognitive Status: Within Functional Limits for tasks assessed                                          Exercises     Shoulder Instructions       General Comments      Pertinent Vitals/ Pain       Pain Assessment: No/denies pain  Home Living                                          Prior Functioning/Environment              Frequency  Min 2X/week        Progress Toward Goals  OT Goals(current goals can now be found in the care plan section)  Progress towards OT goals: Progressing toward goals  Acute Rehab OT Goals Patient Stated Goal: to go home OT Goal Formulation: With patient Time For Goal Achievement: 02/21/20 Potential to Achieve Goals: Good  Plan Discharge plan remains appropriate    Co-evaluation                 AM-PAC OT "6 Clicks" Daily Activity     Outcome Measure   Help from another person eating meals?: None Help from another person taking care of personal grooming?: A Little Help from another person toileting, which includes using toliet, bedpan, or urinal?: A Little Help from another person bathing (including washing, rinsing, drying)?: A Little Help from another person to put on and taking off regular upper body clothing?: A Little Help from another person to put on and taking off regular lower body clothing?: A Little 6 Click Score: 19    End of Session Equipment Utilized During Treatment: Oxygen  OT Visit Diagnosis: Unsteadiness on feet (R26.81)   Activity Tolerance Patient tolerated treatment well   Patient Left in chair;with chair alarm set   Nurse Communication  (okay to see per RN)        Time: 7829-5621 OT Time Calculation (min): 26 min  Charges: OT General Charges $OT Visit: 1 Visit OT Treatments $Self Care/Home Management : 23-37 mins  Praveen Coia, OTR/L Acute Care Rehab Services  Office [phone] Pager: [phone]    Kelli Churn 02/14/2020, 2:04 PM

## 2020-02-14 NOTE — Evaluation (Signed)
Physical Therapy Re-Evaluation Patient Details Name: Tami Richards MRN: [id number] DOB: Nov 09, 1934 Today's Date: 02/14/2020   History of Present Illness  85 year old female who is hard of hearing and a poor historian with a past medical history of diabetes, CKD 4, hypothyroidism, hypertension and hyperlipidemia who is unvaccinated against COVID-19 who was brought in by EMS from home for change in mental status and weakness. She is being treated for COVID. Patient s/p rapid response for hemorrhagic shock on 9/10 and required emergent PRBC transfusions. Therapy reorderd 9/14.  Clinical Impression  The patient  Reports feeling cold and tired. Has been in recliner for several hours. Patient assisted back into bed. Patient declined to ambulate at this time. Continue PT while in acute care.     Follow Up Recommendations Home health PT;Supervision/Assistance - 24 hour    Equipment Recommendations  None recommended by PT    Recommendations for Other Services       Precautions / Restrictions Precautions Precautions: Fall Precaution Comments: Sudden LOBs - hands on for safety Restrictions Weight Bearing Restrictions: No      Mobility  Bed Mobility Overal bed mobility: Needs Assistance Bed Mobility: Sit to Supine Rolling: Supervision     Sit to supine: Min guard   General bed mobility comments: able to return to sidelying.  Transfers Overall transfer level: Needs assistance Equipment used: Rolling walker (2 wheeled) Transfers: Sit to/from [employer] Sit to Stand: Min assist Stand pivot transfers: Min assist       General transfer comment: safe min for stnading from recliner, small steps to bed. HHA.  Ambulation/Gait             General Gait Details: pt declines, reports tired and cold  Stairs            Wheelchair Mobility    Modified Rankin (Stroke Patients Only)       Balance Overall balance assessment: Needs  assistance Sitting-balance support: No upper extremity supported;Feet supported Sitting balance-Leahy Scale: Good     Standing balance support: Single extremity supported Standing balance-Leahy Scale: Poor Standing balance comment: Required external support to maintain balance. Use of hands on walker needed. had one major fall to the right when taking hand off of sink that therapist had to assist with correcting.                             Pertinent Vitals/Pain Pain Assessment: No/denies pain Faces Pain Scale: Hurts little more Pain Location: "I just hurt all over" Pain Descriptors / Indicators: Aching;Sore Pain Intervention(s): Monitored during session;Repositioned    Home Living Family/patient expects to be discharged to:: Private residence Living Arrangements: Other relatives Available Help at Discharge: Family;Available 24 hours/day Type of Home: Apartment       Home Layout: One level Home Equipment: Walker - standard;Cane - single point;Walker - 4 wheels      Prior Function Level of Independence: Independent               Hand Dominance   Dominant Hand: Right    Extremity/Trunk Assessment   Upper Extremity Assessment Upper Extremity Assessment: Generalized weakness    Lower Extremity Assessment Lower Extremity Assessment: Generalized weakness    Cervical / Trunk Assessment Cervical / Trunk Assessment: Normal  Communication   Communication: HOH  Cognition Arousal/Alertness: Awake/alert Behavior During Therapy: WFL for tasks assessed/performed Overall Cognitive Status: No family/caregiver present to determine baseline cognitive functioning  General Comments: pt lethargic, tired and reports she didn't sleep well last night, oriented to [facility]      General Comments      Exercises     Assessment/Plan    PT Assessment Patient needs continued PT services  PT Problem List Decreased  mobility;Decreased balance;Decreased knowledge of use of DME       PT Treatment Interventions DME instruction;Gait training;Therapeutic activities;Therapeutic exercise;Patient/family education;Balance training;Functional mobility training    PT Goals (Current goals can be found in the Care Plan section)  Acute Rehab PT Goals Patient Stated Goal: to go home PT Goal Formulation: With patient Time For Goal Achievement: 02/28/20 Potential to Achieve Goals: Good    Frequency Min 3X/week   Barriers to discharge        Co-evaluation               AM-PAC PT "6 Clicks" Mobility  Outcome Measure Help needed turning from your back to your side while in a flat bed without using bedrails?: None Help needed moving from lying on your back to sitting on the side of a flat bed without using bedrails?: None Help needed moving to and from a bed to a chair (including a wheelchair)?: A Little Help needed standing up from a chair using your arms (e.g., wheelchair or bedside chair)?: A Little Help needed to walk in hospital room?: A Lot Help needed climbing 3-5 steps with a railing? : A Lot 6 Click Score: 18    End of Session Equipment Utilized During Treatment: Oxygen Activity Tolerance: Patient limited by fatigue Patient left: in bed;with call bell/phone within reach;with bed alarm set Nurse Communication: Mobility status;Other (comment) PT Visit Diagnosis: Unsteadiness on feet (R26.81);Muscle weakness (generalized) (M62.81)    Time: 1610-9604 PT Time Calculation (min) (ACUTE ONLY): 13 min   Charges:   PT Evaluation $PT Re-evaluation: 1 Re-eval          [place] Pager [phone] Office [phone]   Claretha Cooper 02/14/2020, 4:14 PM

## 2020-02-14 NOTE — Progress Notes (Signed)
PROGRESS NOTE    JESSICAH FABIAN  UUV:[id number] DOB: 1934-10-09 DOA: 02/05/2020 PCP: Charlane Ferretti, DO     Brief Narrative:  85 year old WF PMHx  HOH, DM 2, CKD 4, hypothyroidism, HTN, and HLD   who was brought to the ED via EMS due to mental status changes and severe profound weakness.  According to the family the patient had been sick for approximately 1 week. She was tested 9/3 and found to be positive for Covid.  She received a Regeneron infusion 9/5.  When she presented for the infusion she was found to be hypotensive with systolics in the 80s.  She was treated with a bolus of fluid.  Oxygen saturations at that time varied between 84 to 96% on room air.  After volume resuscitation she had stabilized and was able to be discharged home.  After returning home unfortunately she continued to decline.  She developed low-grade fevers.  When she became confused the family brought her back to the ED.   Subjective: Afebrile overnight   Assessment & Plan: Covid vaccination; unvaccinated   Principal Problem:   Acute hypoxemic respiratory failure due to COVID-19 [facility]) Active Problems:   Hypothyroidism   Diabetes mellitus type 2, controlled (HCC)   Essential hypertension   Hip pain, bilateral   Altered mental status   Hemorrhagic shock - RPH v/s occult GIB transfused 2 units of O- blood emergency release and subsequently 2 additional units of crossmatched blood - shock state resolved - CT abdomen confirmed a  large retroperitoneal hemorrhage - Hgb appears stable at this time -monitor for concerns for infection of large hematoma Lab Results  Component Value Date   HGB 11.2 (L) 02/13/2020   HGB 12.2 02/12/2020   HGB 11.0 (L) 02/11/2020   HGB 8.4 (L) 02/10/2020   HGB 9.8 (L) 02/10/2020  -Appears relatively stable  Coagulopathy Patient received her last dose of subcu heparin at approximately 6 AM 9/10 -despite this her PTT in the afternoon 9/10 remained quite elevated at  approximately 85 -given her life-threatening bleeding I dosed her with max dose protamine -her PTT has corrected and is remaining stable  Acute respiratory failure with hypoxia/Covid pneumonia COVID-19 Labs  No results for input(s): DDIMER, FERRITIN, LDH, CRP in the last 72 hours.  No results found for: SARSCOV2NAA  Received Regeneron 9/5  -has completed a course of remdesivir  - stopped baricitinib while patient was intolerant of oral intake - appears clinically stable from a respiratory standpoint presently  -9/16 obtain ambulatory SPO2  Acute metabolic encephalopathy Appears to have simply been related to acute illness and dehydration initially, and now due to recovery from shock state as well as narcotic pain meds -transition to oral narcotics as able -much improved today  CKD stage IV Baseline creatinine appears to be approximately 2.2 -creatinine bumped up post shock state but appears to be improving again at this time and is actually better than her suspected baseline Lab Results  Component Value Date   CREATININE 1.57 (H) 02/13/2020   CREATININE 1.61 (H) 02/12/2020   CREATININE 2.08 (H) 02/11/2020   CREATININE 2.61 (H) 02/10/2020   CREATININE 2.37 (H) 02/09/2020  -Better than baseline  DM2 Intermittent episodes of hypoglycemia due to poor intake - continue to monitor trend  Essential HTN Not presently an issue  HLD Continue atorvastatin when intake improved  Hypothyroidism Continue levothyroxine    DVT prophylaxis: SCD Code Status: DNR Family Communication:  Status is: Inpatient    Dispo: The patient is from:  Home              Anticipated d/c is to: Home lives with 2 grandsons              Anticipated d/c date is: 9/18              Patient currently unstable      Consultants:    Procedures/Significant Events:    I have personally reviewed and interpreted all radiology studies and my findings are as above.  VENTILATOR SETTINGS: Nasal  cannula 9/15 SPO2; 1 L/min    Cultures   Antimicrobials:    Devices    LINES / TUBES:      Continuous Infusions: . dextrose 5 % and 0.9% NaCl 100 mL/hr at 02/13/20 2226     Objective: Vitals:   02/13/20 1357 02/13/20 1941 02/14/20 0337 02/14/20 0527  BP: 133/67 (!) 119/58 (!) 135/56   Pulse: 88 75 71   Resp: 16 15 15    Temp: 98.4 F (36.9 C) 98.3 F (36.8 C) 98.5 F (36.9 C)   TempSrc: Oral Oral Axillary   SpO2: 96% 92% 91% 99%  Weight:      Height:        Intake/Output Summary (Last 24 hours) at 02/14/2020 0957 Last data filed at 02/14/2020 2878 Gross per 24 hour  Intake 1512.96 ml  Output 600 ml  Net 912.96 ml   Filed Weights   02/10/20 2300  Weight: 65.3 kg    Examination:  General: A/O x4, positive acute respiratory distress Eyes: negative scleral hemorrhage, negative anisocoria, negative icterus ENT: Negative Runny nose, negative gingival bleeding, Neck:  Negative scars, masses, torticollis, lymphadenopathy, JVD Lungs: decreased breath sounds bilateral without wheezes or crackles Cardiovascular: Regular rate and rhythm without murmur gallop or rub normal S1 and S2 Abdomen: negative abdominal pain, nondistended, positive soft, bowel sounds, no rebound, no ascites, no appreciable mass Extremities: No significant cyanosis, clubbing, or edema bilateral lower extremities Skin: Negative rashes, lesions, ulcers Psychiatric:  Negative depression, negative anxiety, negative fatigue, negative mania  Central nervous system:  Cranial nerves II through XII intact, tongue/uvula midline, all extremities muscle strength 5/5, sensation intact throughout, negative dysarthria, negative expressive aphasia, negative receptive aphasia.  .     Data Reviewed: Care during the described time interval was provided by me .  I have reviewed this patient's available data, including medical history, events of note, physical examination, and all test results as part of my  evaluation.  CBC: Recent Labs  Lab 02/08/20 0405 02/08/20 0405 02/09/20 0423 02/09/20 1058 02/10/20 0152 02/10/20 1056 02/11/20 0456 02/12/20 0507 02/13/20 0619  WBC 9.0   < > 11.1*   < > 15.6* 11.8* 12.4* 19.3* 15.2*  NEUTROABS 7.3  --  8.3*  --   --   --   --   --   --   HGB 12.0   < > 9.9*   < > 9.8* 8.4* 11.0* 12.2 11.2*  HCT 37.0   < > 30.9*   < > 29.8* 24.5* 32.0* 37.6 34.8*  MCV 91.6   < > 92.8   < > 88.2 86.9 86.5 90.0 91.1  PLT 177   < > 203   < > 125* 136* 110* 151 224   < > = values in this interval not displayed.   Basic Metabolic Panel: Recent Labs  Lab 02/08/20 0405 02/08/20 0405 02/09/20 0423 02/10/20 0152 02/11/20 0456 02/12/20 0507 02/13/20 0619  NA 136   < >  136 138 144 143 143  K 4.8   < > 4.9 4.7 4.0 3.8 3.8  CL 109   < > 106 110 113* 111 110  CO2 20*   < > 16* 16* 22 22 23   GLUCOSE 197*   < > 242* 215* 81 108* 45*  BUN 62*   < > 77* 84* 67* 43* 32*  CREATININE 2.04*   < > 2.37* 2.61* 2.08* 1.61* 1.57*  CALCIUM 8.8*   < > 8.7* 8.4* 8.7* 8.7* 8.5*  MG 2.6*  --  2.7* 2.6*  --   --   --    < > = values in this interval not displayed.   GFR: Estimated Creatinine Clearance: 25.4 mL/min (A) (by C-G formula based on SCr of 1.57 mg/dL (H)). Liver Function Tests: Recent Labs  Lab 02/08/20 0405 02/09/20 0423 02/10/20 0152 02/11/20 0456 02/12/20 0507  AST 51* 47* 53* 45* 42*  ALT 31 33 37 38 38  ALKPHOS 66 48 46 52 54  BILITOT 0.4 0.6 0.8 0.9 1.3*  PROT 6.2* 5.2* 5.1* 5.2* 5.8*  ALBUMIN 3.1* 2.8* 3.0* 2.9* 3.0*   No results for input(s): LIPASE, AMYLASE in the last 168 hours. No results for input(s): AMMONIA in the last 168 hours. Coagulation Profile: Recent Labs  Lab 02/09/20 1540  INR 1.2   Cardiac Enzymes: No results for input(s): CKTOTAL, CKMB, CKMBINDEX, TROPONINI in the last 168 hours. BNP (last 3 results) Recent Labs    12/12/19 1455  PROBNP 201.0*   HbA1C: No results for input(s): HGBA1C in the last 72 hours. CBG: Recent  Labs  Lab 02/13/20 1119 02/13/20 1610 02/13/20 1630 02/13/20 2111 02/14/20 0747  GLUCAP 87 47* 74 159* 121*   Lipid Profile: No results for input(s): CHOL, HDL, LDLCALC, TRIG, CHOLHDL, LDLDIRECT in the last 72 hours. Thyroid Function Tests: No results for input(s): TSH, T4TOTAL, FREET4, T3FREE, THYROIDAB in the last 72 hours. Anemia Panel: No results for input(s): VITAMINB12, FOLATE, FERRITIN, TIBC, IRON, RETICCTPCT in the last 72 hours. Sepsis Labs: No results for input(s): PROCALCITON, LATICACIDVEN in the last 168 hours.  Recent Results (from the past 240 hour(s))  Blood Culture (routine x 2)     Status: Abnormal   Collection Time: 02/05/20 11:28 AM   Specimen: BLOOD  Result Value Ref Range Status   Specimen Description   Final    BLOOD LEFT ANTECUBITAL Performed at [facility], [address] [place], [place] [address]    Special Requests   Final    BOTTLES DRAWN AEROBIC AND ANAEROBIC Blood Culture adequate volume Performed at [facility], [address] [place], [place] [address]    Culture  Setup Time   Final    GRAM POSITIVE COCCI IN CLUSTERS AEROBIC BOTTLE ONLY CRITICAL RESULT CALLED TO, READ BACK BY AND VERIFIED WITH: J,GADHIA PHARMD @1811  02/06/20 EB    Culture (A)  Final    ROTHIA MUCILAGINOSA THE SIGNIFICANCE OF ISOLATING THIS ORGANISM FROM A SINGLE SET OF BLOOD CULTURES WHEN MULTIPLE SETS ARE DRAWN IS UNCERTAIN. PLEASE NOTIFY THE MICROBIOLOGY DEPARTMENT WITHIN ONE WEEK IF SPECIATION AND SENSITIVITIES ARE REQUIRED. Performed at [facility], [address] [place], [place] [address]    Report Status 02/08/2020 FINAL  Final  Blood Culture ID Panel (Reflexed)     Status: None   Collection Time: 02/05/20 11:28 AM  Result Value Ref Range Status   Enterococcus faecalis NOT DETECTED NOT DETECTED Final   Enterococcus Faecium  NOT DETECTED NOT DETECTED Final   Listeria monocytogenes NOT DETECTED NOT DETECTED Final   Staphylococcus  species NOT DETECTED NOT DETECTED Final   Staphylococcus aureus (BCID) NOT DETECTED NOT DETECTED Final   Staphylococcus epidermidis NOT DETECTED NOT DETECTED Final   Staphylococcus lugdunensis NOT DETECTED NOT DETECTED Final   Streptococcus species NOT DETECTED NOT DETECTED Final   Streptococcus agalactiae NOT DETECTED NOT DETECTED Final   Streptococcus pneumoniae NOT DETECTED NOT DETECTED Final   Streptococcus pyogenes NOT DETECTED NOT DETECTED Final   A.calcoaceticus-baumannii NOT DETECTED NOT DETECTED Final   Bacteroides fragilis NOT DETECTED NOT DETECTED Final   Enterobacterales NOT DETECTED NOT DETECTED Final   Enterobacter cloacae complex NOT DETECTED NOT DETECTED Final   Escherichia coli NOT DETECTED NOT DETECTED Final   Klebsiella aerogenes NOT DETECTED NOT DETECTED Final   Klebsiella oxytoca NOT DETECTED NOT DETECTED Final   Klebsiella pneumoniae NOT DETECTED NOT DETECTED Final   Proteus species NOT DETECTED NOT DETECTED Final   Salmonella species NOT DETECTED NOT DETECTED Final   Serratia marcescens NOT DETECTED NOT DETECTED Final   Haemophilus influenzae NOT DETECTED NOT DETECTED Final   Neisseria meningitidis NOT DETECTED NOT DETECTED Final   Pseudomonas aeruginosa NOT DETECTED NOT DETECTED Final   Stenotrophomonas maltophilia NOT DETECTED NOT DETECTED Final   Candida albicans NOT DETECTED NOT DETECTED Final   Candida auris NOT DETECTED NOT DETECTED Final   Candida glabrata NOT DETECTED NOT DETECTED Final   Candida krusei NOT DETECTED NOT DETECTED Final   Candida parapsilosis NOT DETECTED NOT DETECTED Final   Candida tropicalis NOT DETECTED NOT DETECTED Final   Cryptococcus neoformans/gattii NOT DETECTED NOT DETECTED Final    Comment: Performed at [facility], [address] [place], [place] [address]  Blood Culture (routine x 2)     Status: None   Collection Time: 02/05/20 11:33 AM   Specimen: BLOOD  Result Value Ref Range Status   Specimen Description   Final      BLOOD LEFT ANTECUBITAL Performed at [facility], [address] [place], [place] [address]    Special Requests   Final    BOTTLES DRAWN AEROBIC AND ANAEROBIC Blood Culture adequate volume Performed at [facility], [address], [place], [place] [address]    Culture   Final    NO GROWTH 5 DAYS Performed at [facility], [address] [place], [place] [address]    Report Status 02/10/2020 FINAL  Final         Radiology Studies: No results found.      Scheduled Meds: . insulin aspart  0-9 Units Subcutaneous TID WC  . levothyroxine  50 mcg Oral Q0600  . mouth rinse  15 mL Mouth Rinse BID  . montelukast  10 mg Oral QHS   Continuous Infusions: . dextrose 5 % and 0.9% NaCl 100 mL/hr at 02/13/20 2226     LOS: 9 days    Time spent:40 min    Ellard Nan, Roselind Messier, MD [facility] Pager [phone]  If 7PM-7AM, please contact night-coverage [email or web address] Password [facility] 02/14/2020, 9:57 AM

## 2020-02-15 ENCOUNTER — Inpatient Hospital Stay (HOSPITAL_COMMUNITY): Payer: Medicare Other

## 2020-02-15 DIAGNOSIS — R578 Other shock: Secondary | ICD-10-CM

## 2020-02-15 DIAGNOSIS — N184 Chronic kidney disease, stage 4 (severe): Secondary | ICD-10-CM

## 2020-02-15 DIAGNOSIS — D689 Coagulation defect, unspecified: Secondary | ICD-10-CM | POA: Diagnosis present

## 2020-02-15 DIAGNOSIS — R58 Hemorrhage, not elsewhere classified: Secondary | ICD-10-CM

## 2020-02-15 DIAGNOSIS — G9341 Metabolic encephalopathy: Secondary | ICD-10-CM

## 2020-02-15 DIAGNOSIS — K683 Retroperitoneal hematoma: Secondary | ICD-10-CM | POA: Diagnosis present

## 2020-02-15 DIAGNOSIS — E785 Hyperlipidemia, unspecified: Secondary | ICD-10-CM | POA: Diagnosis present

## 2020-02-15 DIAGNOSIS — J9601 Acute respiratory failure with hypoxia: Secondary | ICD-10-CM | POA: Diagnosis present

## 2020-02-15 DIAGNOSIS — U071 COVID-19: Secondary | ICD-10-CM | POA: Diagnosis present

## 2020-02-15 DIAGNOSIS — E7800 Pure hypercholesterolemia, unspecified: Secondary | ICD-10-CM

## 2020-02-15 DIAGNOSIS — J1282 Pneumonia due to coronavirus disease 2019: Secondary | ICD-10-CM

## 2020-02-15 DIAGNOSIS — IMO0002 Reserved for concepts with insufficient information to code with codable children: Secondary | ICD-10-CM | POA: Diagnosis present

## 2020-02-15 DIAGNOSIS — E038 Other specified hypothyroidism: Secondary | ICD-10-CM

## 2020-02-15 LAB — URINALYSIS, ROUTINE W REFLEX MICROSCOPIC
Bilirubin Urine: NEGATIVE
Glucose, UA: NEGATIVE mg/dL
Ketones, ur: NEGATIVE mg/dL
Nitrite: NEGATIVE
Protein, ur: NEGATIVE mg/dL
Specific Gravity, Urine: 1.011 (ref 1.005–1.030)
WBC, UA: >50 WBC/hpf — ABNORMAL HIGH (ref 0–5)
pH: 6 (ref 5.0–8.0)

## 2020-02-15 LAB — CBC WITH DIFFERENTIAL/PLATELET
Abs Immature Granulocytes: 0.14 10*3/uL — ABNORMAL HIGH (ref 0.00–0.07)
Basophils Absolute: 0 10*3/uL (ref 0.0–0.1)
Basophils Relative: 0 %
Eosinophils Absolute: 0.1 10*3/uL (ref 0.0–0.5)
Eosinophils Relative: 1 %
HCT: 34.8 % — ABNORMAL LOW (ref 36.0–46.0)
Hemoglobin: 10.9 g/dL — ABNORMAL LOW (ref 12.0–15.0)
Immature Granulocytes: 1 %
Lymphocytes Relative: 8 %
Lymphs Abs: 0.9 10*3/uL (ref 0.7–4.0)
MCH: 29.3 pg (ref 26.0–34.0)
MCHC: 31.3 g/dL (ref 30.0–36.0)
MCV: 93.5 fL (ref 80.0–100.0)
Monocytes Absolute: 1.2 10*3/uL — ABNORMAL HIGH (ref 0.1–1.0)
Monocytes Relative: 11 %
Neutro Abs: 8.6 10*3/uL — ABNORMAL HIGH (ref 1.7–7.7)
Neutrophils Relative %: 79 %
Platelets: 264 10*3/uL (ref 150–400)
RBC: 3.72 MIL/uL — ABNORMAL LOW (ref 3.87–5.11)
RDW: 15.9 % — ABNORMAL HIGH (ref 11.5–15.5)
WBC: 10.9 10*3/uL — ABNORMAL HIGH (ref 4.0–10.5)
nRBC: 0 % (ref 0.0–0.2)

## 2020-02-15 LAB — D-DIMER, QUANTITATIVE: D-Dimer, Quant: 12.52 ug/mL-FEU — ABNORMAL HIGH (ref 0.00–0.50)

## 2020-02-15 LAB — COMPREHENSIVE METABOLIC PANEL
ALT: 25 U/L (ref 0–44)
AST: 20 U/L (ref 15–41)
Albumin: 2.2 g/dL — ABNORMAL LOW (ref 3.5–5.0)
Alkaline Phosphatase: 54 U/L (ref 38–126)
Anion gap: 6 (ref 5–15)
BUN: 18 mg/dL (ref 8–23)
CO2: 22 mmol/L (ref 22–32)
Calcium: 8.2 mg/dL — ABNORMAL LOW (ref 8.9–10.3)
Chloride: 108 mmol/L (ref 98–111)
Creatinine, Ser: 1.28 mg/dL — ABNORMAL HIGH (ref 0.44–1.00)
GFR calc Af Amer: 45 mL/min — ABNORMAL LOW (ref >60)
GFR calc non Af Amer: 39 mL/min — ABNORMAL LOW (ref >60)
Glucose, Bld: 186 mg/dL — ABNORMAL HIGH (ref 70–99)
Potassium: 3.3 mmol/L — ABNORMAL LOW (ref 3.5–5.1)
Sodium: 136 mmol/L (ref 135–145)
Total Bilirubin: 1.6 mg/dL — ABNORMAL HIGH (ref 0.3–1.2)
Total Protein: 4.9 g/dL — ABNORMAL LOW (ref 6.5–8.1)

## 2020-02-15 LAB — GLUCOSE, CAPILLARY
Glucose-Capillary: 204 mg/dL — ABNORMAL HIGH (ref 70–99)
Glucose-Capillary: 173 mg/dL — ABNORMAL HIGH (ref 70–99)
Glucose-Capillary: 79 mg/dL (ref 70–99)
Glucose-Capillary: 135 mg/dL — ABNORMAL HIGH (ref 70–99)

## 2020-02-15 LAB — FERRITIN: Ferritin: 577 ng/mL — ABNORMAL HIGH (ref 11–307)

## 2020-02-15 LAB — PHOSPHORUS: Phosphorus: 1.4 mg/dL — ABNORMAL LOW (ref 2.5–4.6)

## 2020-02-15 LAB — MAGNESIUM: Magnesium: 1.7 mg/dL (ref 1.7–2.4)

## 2020-02-15 LAB — PROTIME-INR
INR: 1 (ref 0.8–1.2)
Prothrombin Time: 12.6 seconds (ref 11.4–15.2)

## 2020-02-15 LAB — C-REACTIVE PROTEIN: CRP: 12.7 mg/dL — ABNORMAL HIGH (ref <1.0)

## 2020-02-15 LAB — LACTATE DEHYDROGENASE: LDH: 258 U/L — ABNORMAL HIGH (ref 98–192)

## 2020-02-15 LAB — APTT: aPTT: <20 seconds — ABNORMAL LOW (ref 24–36)

## 2020-02-15 MED ORDER — MAGNESIUM SULFATE 2 GM/50ML IV SOLN
2.0000 g | Freq: Once | INTRAVENOUS | Status: AC
  Administered 2020-02-15: 2 g via INTRAVENOUS
  Filled 2020-02-15: qty 50

## 2020-02-15 MED ORDER — MAGNESIUM SULFATE 50 % IJ SOLN: 2.0000 g | Freq: Once | INTRAMUSCULAR | Status: DC

## 2020-02-15 MED ORDER — POTASSIUM PHOSPHATES 15 MMOLE/5ML IV SOLN
40.0000 mmol | Freq: Once | INTRAVENOUS | Status: AC
  Administered 2020-02-15: 40 mmol via INTRAVENOUS
  Filled 2020-02-15: qty 13.33

## 2020-02-15 NOTE — Progress Notes (Signed)
Physical Therapy Treatment Patient Details Name: Tami Richards MRN: [id number] DOB: 1935/02/18 Today's Date: 02/15/2020    History of Present Illness 85 year old female who is hard of hearing and a poor historian with a past medical history of diabetes, CKD 4, hypothyroidism, hypertension and hyperlipidemia who is unvaccinated against COVID-19 who was brought in by EMS from home for change in mental status and weakness. She is being treated for COVID. Patient s/p rapid response for hemorrhagic shock on 9/10 and required emergent PRBC transfusions. Therapy reorderd 9/14.    PT Comments    The patient is very alert and participatory. Patient mobilized using Rw. Ambulated  Forward and backward( tethered to wall with lines so limited). No balance losses. Patient reports that she as 2 20's YO grandsons to assist at Dc. Patient mobilized on RA , maintaining  SPO2 > 88%. Will need formal saturation walk test prior to DC.   Follow Up Recommendations  Home health PT;Supervision/Assistance - 24 hour     Equipment Recommendations  None recommended by PT    Recommendations for Other Services       Precautions / Restrictions Precautions Precautions: Fall Precaution Comments: monitor sats    Mobility  Bed Mobility Overal bed mobility: Needs Assistance Bed Mobility: Rolling;Sidelying to Sit;Sit to Supine Rolling: Supervision Sidelying to sit: Supervision   Sit to supine: Supervision   General bed mobility comments: did not require extra assistance  Transfers Overall transfer level: Needs assistance Equipment used: Rolling walker (2 wheeled) Transfers: Sit to/from Stand Sit to Stand: Min assist Stand pivot transfers: Min assist       General transfer comment: safe min for standing from recliner,  Ambulation/Gait Ambulation/Gait assistance: Min assist Gait Distance (Feet): 10 Feet Assistive device: Rolling walker (2 wheeled) Gait Pattern/deviations: Step-through  pattern;Step-to pattern     General Gait Details: stepped forward and backward x 2 for 6 steps.   Stairs             Wheelchair Mobility    Modified Rankin (Stroke Patients Only)       Balance   Sitting-balance support: No upper extremity supported;Feet supported Sitting balance-Leahy Scale: Good     Standing balance support: Bilateral upper extremity supported Standing balance-Leahy Scale: Fair Standing balance comment: with RW                            Cognition Arousal/Alertness: Awake/alert Behavior During Therapy: WFL for tasks assessed/performed Overall Cognitive Status: No family/caregiver present to determine baseline cognitive functioning                                 General Comments: awake, follows directions and participates      Exercises General Exercises - Lower Extremity Hip Flexion/Marching: AROM;Standing;Both;10 reps    General Comments        Pertinent Vitals/Pain Faces Pain Scale: Hurts little more Pain Location: right hip Pain Descriptors / Indicators: Grimacing;Discomfort Pain Intervention(s): Monitored during session    Home Living                      Prior Function            PT Goals (current goals can now be found in the care plan section) Progress towards PT goals: Progressing toward goals    Frequency    Min 3X/week      PT  Plan Current plan remains appropriate    Co-evaluation              AM-PAC PT "6 Clicks" Mobility   Outcome Measure  Help needed turning from your back to your side while in a flat bed without using bedrails?: None Help needed moving from lying on your back to sitting on the side of a flat bed without using bedrails?: None Help needed moving to and from a bed to a chair (including a wheelchair)?: A Little Help needed standing up from a chair using your arms (e.g., wheelchair or bedside chair)?: A Little Help needed to walk in hospital room?: A  Little Help needed climbing 3-5 steps with a railing? : A Lot 6 Click Score: 19    End of Session Equipment Utilized During Treatment: Oxygen Activity Tolerance: Patient tolerated treatment well Patient left: in bed;with call bell/phone within reach;with bed alarm set Nurse Communication: Mobility status;Other (comment) PT Visit Diagnosis: Unsteadiness on feet (R26.81);Muscle weakness (generalized) (M62.81)     Time: 1464-3142 PT Time Calculation (min) (ACUTE ONLY): 30 min  Charges:  $Gait Training: 23-37 mins                     [place] Pager [phone] Office [phone]    Claretha Cooper 02/15/2020, 4:58 PM

## 2020-02-15 NOTE — Progress Notes (Signed)
PROGRESS NOTE    Tami Richards  WUJ:[id number] DOB: May 24, 1935 DOA: 02/05/2020 PCP: Charlane Ferretti, DO     Brief Narrative:  85 year old WF PMHx  HOH, DM 2, CKD 4, hypothyroidism, HTN, and HLD   who was brought to the ED via EMS due to mental status changes and severe profound weakness.  According to the family the patient had been sick for approximately 1 week. She was tested 9/3 and found to be positive for Covid.  She received a Regeneron infusion 9/5.  When she presented for the infusion she was found to be hypotensive with systolics in the 80s.  She was treated with a bolus of fluid.  Oxygen saturations at that time varied between 84 to 96% on room air.  After volume resuscitation she had stabilized and was able to be discharged home.  After returning home unfortunately she continued to decline.  She developed low-grade fevers.  When she became confused the family brought her back to the ED.   Subjective: Afebrile overnight patient complains of pain RIGHT lateral abdomen and hip.  Patient complained to RN of burning upon urination   Assessment & Plan: Covid vaccination; unvaccinated   Principal Problem:   Acute hypoxemic respiratory failure due to COVID-19 [facility]) Active Problems:   Hypothyroidism   Diabetes mellitus type 2, controlled (HCC)   Essential hypertension   Hip pain, bilateral   Altered mental status   Hemorrhagic shock (HCC)   Retroperitoneal bleed   Coagulopathy (HCC)   Acute respiratory failure with hypoxia (HCC)   Pneumonia due to COVID-19 virus   Acute metabolic encephalopathy   CKD (chronic kidney disease), stage IV (HCC)   Diabetes type 2, uncontrolled (HCC)   HLD (hyperlipidemia)   Hemorrhagic shock/acute RIGHT Retroperitoneal Hematoma -9/10 transfuse 2 units PRBC -9/11 transfuse 2 units PRBC.  -9/11 CT abdomen pelvis shows large retroperitoneal hematoma see results below. Lab Results  Component Value Date   HGB 10.9 (L) 02/15/2020   HGB  11.8 (L) 02/14/2020   HGB 11.2 (L) 02/13/2020   HGB 12.2 02/12/2020   HGB 11.0 (L) 02/11/2020  -9/16 patient's H&H dropping will need to rescan abdomen and pelvis for acute bleed. -9/16 PTT/PT/INR pending  Coagulopathy 9/10 patient received her last dose of subcu heparin at approximately 6 AM  -despite this her PTT in the afternoon 9/10 remained quite elevated at approximately 85, given her life-threatening bleeding I dosed her with max dose protamine  -her PTT has corrected and is remaining stable  Acute respiratory failure with hypoxia/Covid pneumonia COVID-19 Labs  Recent Labs    02/14/20 1227 02/15/20 0410  DDIMER 13.67* 12.52*  FERRITIN 568* 577*  LDH 288* 258*  CRP 13.6* 12.7*    No results found for: SARSCOV2NAA  Received Regeneron 9/5  -has completed a course of Remdesivir  - stopped Baricitinib while patient was intolerant of oral intake - appears clinically stable from a respiratory standpoint presently  -9/16 obtain ambulatory SPO2  Acute metabolic encephalopathy -Appears to have simply been related to acute illness and dehydration initially, and now due to recovery from shock state as well as narcotic pain meds -9/16 resolved  CKD stage IV (baseline CR~2.2)  Lab Results  Component Value Date   CREATININE 1.28 (H) 02/15/2020   CREATININE 1.35 (H) 02/14/2020   CREATININE 1.57 (H) 02/13/2020   CREATININE 1.61 (H) 02/12/2020   CREATININE 2.08 (H) 02/11/2020  -Better than baseline  DM type II uncontrolled -9/8 hemoglobin A1c 8= 7.7   Essential  HTN -Not presently an issue  HLD -Lipid panel pending  -Continue atorvastatin when intake improved  Hypothyroidism -Synthroid 50 mcg daily   Hypokalemia -Potassium goal> 4 -K-Phos 40 mmol  Hypomagnesmia -Magnesium goal> 2 -Magnesium IV 2 g  Hypophosphatemia -Phosphorus goal> 2.5 -See hypokalemia   DVT prophylaxis: SCD Code Status: DNR Family Communication:  Status is:  Inpatient    Dispo: The patient is from: Home              Anticipated d/c is to: Home lives with 2 grandsons              Anticipated d/c date is: 9/18              Patient currently unstable      Consultants:    Procedures/Significant Events:  9/11 CT abdomen pelvis W0 contrast;-large retroperitoneal hematoma extending from the mid RIGHT kidney level to the RIGHT inguinal region. Hematoma volume equal 400 cubic cm. High-density hematoma within the RIGHT psoas muscle may be source of hematoma. -.Large nonobstructing calculus in the RIGHT renal pelvis. -Atrophic LEFT kidney. -Gallstone without evidence cholecystitis.   I have personally reviewed and interpreted all radiology studies and my findings are as above.  VENTILATOR SETTINGS: Nasal cannula 9/16 Flow;; 1 L/min SPO2 99%    Cultures 9/3/SARS coronavirus positive 9/16 urine culture pending  Antimicrobials: Anti-infectives (From admission, onward)   Start     Ordered Stop   02/06/20 1000  remdesivir 100 mg in sodium chloride 0.9 % 100 mL IVPB       "Followed by" Linked Group Details   02/05/20 1536 02/09/20 1028   02/05/20 1600  remdesivir 200 mg in sodium chloride 0.9% 250 mL IVPB       "Followed by" Linked Group Details   02/05/20 1536 02/05/20 2002       Devices    LINES / TUBES:      Continuous Infusions: . dextrose 5 % and 0.9% NaCl Stopped (02/15/20 1658)  . potassium PHOSPHATE IVPB (in mmol) 86 mL/hr at 02/15/20 1826     Objective: Vitals:   02/14/20 1958 02/14/20 2100 02/15/20 0345 02/15/20 1333  BP: (!) 138/44 138/60 (!) 143/77 (!) 131/58  Pulse: 75  84 72  Resp: 15  15 15   Temp: 98.5 F (36.9 C)  98.6 F (37 C) 98.4 F (36.9 C)  TempSrc: Oral   Oral  SpO2: 94%  97% 99%  Weight:      Height:        Intake/Output Summary (Last 24 hours) at 02/15/2020 1853 Last data filed at 02/15/2020 1700 Gross per 24 hour  Intake 3528.01 ml  Output 1825 ml  Net 1703.01 ml   Filed Weights    02/10/20 2300  Weight: 65.3 kg   Physical Exam:  General: A/O x4, positive minimal acute respiratory distress Eyes: negative scleral hemorrhage, negative anisocoria, negative icterus ENT: Negative Runny nose, negative gingival bleeding, Neck:  Negative scars, masses, torticollis, lymphadenopathy, JVD Lungs: Clear to auscultation bilaterally without wheezes or crackles Cardiovascular: Regular rate and rhythm without murmur gallop or rub normal S1 and S2 Abdomen: Positive abdominal pain right lateral aspect of abdomen from lower rib down to hip, nondistended, positive soft, bowel sounds, no rebound, no ascites, no appreciable mass Extremities: No significant cyanosis, clubbing, or edema bilateral lower extremities Skin: Negative rashes, lesions, ulcers Psychiatric:  Negative depression, negative anxiety, negative fatigue, negative mania  Central nervous system:  Cranial nerves II through XII intact, tongue/uvula midline, all  extremities muscle strength 5/5, sensation intact throughout, negative dysarthria, negative expressive aphasia, negative receptive aphasia.  .     Data Reviewed: Care during the described time interval was provided by me .  I have reviewed this patient's available data, including medical history, events of note, physical examination, and all test results as part of my evaluation.  CBC: Recent Labs  Lab 02/09/20 0423 02/09/20 1058 02/11/20 0456 02/12/20 0507 02/13/20 0619 02/14/20 1227 02/15/20 0410  WBC 11.1*   < > 12.4* 19.3* 15.2* 14.6* 10.9*  NEUTROABS 8.3*  --   --   --   --  12.6* 8.6*  HGB 9.9*   < > 11.0* 12.2 11.2* 11.8* 10.9*  HCT 30.9*   < > 32.0* 37.6 34.8* 38.4 34.8*  MCV 92.8   < > 86.5 90.0 91.1 94.1 93.5  PLT 203   < > 110* 151 224 310 264   < > = values in this interval not displayed.   Basic Metabolic Panel: Recent Labs  Lab 02/09/20 0423 02/09/20 0423 02/10/20 0152 02/10/20 0152 02/11/20 0456 02/12/20 0507 02/13/20 0619  02/14/20 1227 02/15/20 0410  NA 136   < > 138   < > 144 143 143 139 136  K 4.9   < > 4.7   < > 4.0 3.8 3.8 3.8 3.3*  CL 106   < > 110   < > 113* 111 110 106 108  CO2 16*   < > 16*   < > 22 22 23  21* 22  GLUCOSE 242*   < > 215*   < > 81 108* 45* 200* 186*  BUN 77*   < > 84*   < > 67* 43* 32* 22 18  CREATININE 2.37*   < > 2.61*   < > 2.08* 1.61* 1.57* 1.35* 1.28*  CALCIUM 8.7*   < > 8.4*   < > 8.7* 8.7* 8.5* 8.8* 8.2*  MG 2.7*  --  2.6*  --   --   --   --  2.0 1.7  PHOS  --   --   --   --   --   --   --  1.6* 1.4*   < > = values in this interval not displayed.   GFR: Estimated Creatinine Clearance: 31.2 mL/min (A) (by C-G formula based on SCr of 1.28 mg/dL (H)). Liver Function Tests: Recent Labs  Lab 02/10/20 0152 02/11/20 0456 02/12/20 0507 02/14/20 1227 02/15/20 0410  AST 53* 45* 42* 29 20  ALT 37 38 38 35 25  ALKPHOS 46 52 54 64 54  BILITOT 0.8 0.9 1.3* 1.8* 1.6*  PROT 5.1* 5.2* 5.8* 5.9* 4.9*  ALBUMIN 3.0* 2.9* 3.0* 2.7* 2.2*   No results for input(s): LIPASE, AMYLASE in the last 168 hours. No results for input(s): AMMONIA in the last 168 hours. Coagulation Profile: Recent Labs  Lab 02/09/20 1540  INR 1.2   Cardiac Enzymes: No results for input(s): CKTOTAL, CKMB, CKMBINDEX, TROPONINI in the last 168 hours. BNP (last 3 results) Recent Labs    12/12/19 1455  PROBNP 201.0*   HbA1C: No results for input(s): HGBA1C in the last 72 hours. CBG: Recent Labs  Lab 02/14/20 1956 02/14/20 2119 02/15/20 0813 02/15/20 1112 02/15/20 1631  GLUCAP 141* 143* 204* 173* 79   Lipid Profile: No results for input(s): CHOL, HDL, LDLCALC, TRIG, CHOLHDL, LDLDIRECT in the last 72 hours. Thyroid Function Tests: No results for input(s): TSH, T4TOTAL, FREET4, T3FREE, THYROIDAB in the last  72 hours. Anemia Panel: Recent Labs    02/14/20 1227 02/15/20 0410  FERRITIN 568* 577*   Sepsis Labs: No results for input(s): PROCALCITON, LATICACIDVEN in the last 168 hours.  No  results found for this or any previous visit (from the past 240 hour(s)).       Radiology Studies: No results found.      Scheduled Meds: . insulin aspart  0-9 Units Subcutaneous TID WC  . levothyroxine  50 mcg Oral Q0600  . mouth rinse  15 mL Mouth Rinse BID  . montelukast  10 mg Oral QHS   Continuous Infusions: . dextrose 5 % and 0.9% NaCl Stopped (02/15/20 1658)  . potassium PHOSPHATE IVPB (in mmol) 86 mL/hr at 02/15/20 1826     LOS: 10 days    Time spent:40 min    Shon Mansouri, Roselind Messier, MD [facility] Pager [phone]  If 7PM-7AM, please contact night-coverage [email or web address] Password [facility] 02/15/2020, 6:53 PM

## 2020-02-15 NOTE — Care Management Important Message (Signed)
Important Message  Patient Details IM Letter given to the Case Manager for RN to present to the Patient Name: Tami Richards MRN: [id number] Date of Birth: 06-18-1934   Medicare Important Message Given:  Yes     [name] 02/15/2020, 10:31 AM

## 2020-02-16 DIAGNOSIS — N390 Urinary tract infection, site not specified: Secondary | ICD-10-CM

## 2020-02-16 LAB — GLUCOSE, CAPILLARY
Glucose-Capillary: 178 mg/dL — ABNORMAL HIGH (ref 70–99)
Glucose-Capillary: 190 mg/dL — ABNORMAL HIGH (ref 70–99)
Glucose-Capillary: 113 mg/dL — ABNORMAL HIGH (ref 70–99)
Glucose-Capillary: 166 mg/dL — ABNORMAL HIGH (ref 70–99)

## 2020-02-16 LAB — CBC WITH DIFFERENTIAL/PLATELET
Abs Immature Granulocytes: 0.16 10*3/uL — ABNORMAL HIGH (ref 0.00–0.07)
Basophils Absolute: 0 10*3/uL (ref 0.0–0.1)
Basophils Relative: 0 %
Eosinophils Absolute: 0.1 10*3/uL (ref 0.0–0.5)
Eosinophils Relative: 1 %
HCT: 33.6 % — ABNORMAL LOW (ref 36.0–46.0)
Hemoglobin: 10.6 g/dL — ABNORMAL LOW (ref 12.0–15.0)
Immature Granulocytes: 2 %
Lymphocytes Relative: 9 %
Lymphs Abs: 0.9 10*3/uL (ref 0.7–4.0)
MCH: 28.7 pg (ref 26.0–34.0)
MCHC: 31.5 g/dL (ref 30.0–36.0)
MCV: 91.1 fL (ref 80.0–100.0)
Monocytes Absolute: 1.2 10*3/uL — ABNORMAL HIGH (ref 0.1–1.0)
Monocytes Relative: 12 %
Neutro Abs: 7.6 10*3/uL (ref 1.7–7.7)
Neutrophils Relative %: 76 %
Platelets: 295 10*3/uL (ref 150–400)
RBC: 3.69 MIL/uL — ABNORMAL LOW (ref 3.87–5.11)
RDW: 15.5 % (ref 11.5–15.5)
WBC: 9.9 10*3/uL (ref 4.0–10.5)
nRBC: 0 % (ref 0.0–0.2)

## 2020-02-16 LAB — LIPID PANEL
Cholesterol: 131 mg/dL (ref 0–200)
HDL: 29 mg/dL — ABNORMAL LOW (ref >40)
LDL Cholesterol: 75 mg/dL (ref 0–99)
Total CHOL/HDL Ratio: 4.5 RATIO
Triglycerides: 134 mg/dL (ref <150)
VLDL: 27 mg/dL (ref 0–40)

## 2020-02-16 LAB — COMPREHENSIVE METABOLIC PANEL
ALT: 26 U/L (ref 0–44)
AST: 24 U/L (ref 15–41)
Albumin: 2.3 g/dL — ABNORMAL LOW (ref 3.5–5.0)
Alkaline Phosphatase: 60 U/L (ref 38–126)
Anion gap: 7 (ref 5–15)
BUN: 18 mg/dL (ref 8–23)
CO2: 22 mmol/L (ref 22–32)
Calcium: 8 mg/dL — ABNORMAL LOW (ref 8.9–10.3)
Chloride: 104 mmol/L (ref 98–111)
Creatinine, Ser: 1.34 mg/dL — ABNORMAL HIGH (ref 0.44–1.00)
GFR calc Af Amer: 42 mL/min — ABNORMAL LOW (ref >60)
GFR calc non Af Amer: 37 mL/min — ABNORMAL LOW (ref >60)
Glucose, Bld: 168 mg/dL — ABNORMAL HIGH (ref 70–99)
Potassium: 4.1 mmol/L (ref 3.5–5.1)
Sodium: 133 mmol/L — ABNORMAL LOW (ref 135–145)
Total Bilirubin: 1.6 mg/dL — ABNORMAL HIGH (ref 0.3–1.2)
Total Protein: 5.3 g/dL — ABNORMAL LOW (ref 6.5–8.1)

## 2020-02-16 LAB — LACTATE DEHYDROGENASE: LDH: 310 U/L — ABNORMAL HIGH (ref 98–192)

## 2020-02-16 LAB — PHOSPHORUS: Phosphorus: 3.3 mg/dL (ref 2.5–4.6)

## 2020-02-16 LAB — MAGNESIUM: Magnesium: 2.3 mg/dL (ref 1.7–2.4)

## 2020-02-16 LAB — D-DIMER, QUANTITATIVE: D-Dimer, Quant: 11.82 ug/mL-FEU — ABNORMAL HIGH (ref 0.00–0.50)

## 2020-02-16 LAB — FERRITIN: Ferritin: 532 ng/mL — ABNORMAL HIGH (ref 11–307)

## 2020-02-16 LAB — C-REACTIVE PROTEIN: CRP: 16.2 mg/dL — ABNORMAL HIGH (ref <1.0)

## 2020-02-16 MED ORDER — SODIUM CHLORIDE 0.9 % IV SOLN
2.0000 g | INTRAVENOUS | Status: DC
  Administered 2020-02-16 – 2020-02-18 (×3): 2 g via INTRAVENOUS
  Filled 2020-02-16: qty 2
  Filled 2020-02-16: qty 20
  Filled 2020-02-16: qty 2

## 2020-02-16 NOTE — Progress Notes (Signed)
PROGRESS NOTE    Tami Richards  ZOX:[id number] DOB: August 27, 1934 DOA: 02/05/2020 PCP: Charlane Ferretti, DO     Brief Narrative:  85 year old WF PMHx  HOH, DM 2, CKD 4, hypothyroidism, HTN, and HLD   who was brought to the ED via EMS due to mental status changes and severe profound weakness.  According to the family the patient had been sick for approximately 1 week. She was tested 9/3 and found to be positive for Covid.  She received a Regeneron infusion 9/5.  When she presented for the infusion she was found to be hypotensive with systolics in the 80s.  She was treated with a bolus of fluid.  Oxygen saturations at that time varied between 84 to 96% on room air.  After volume resuscitation she had stabilized and was able to be discharged home.  After returning home unfortunately she continued to decline.  She developed low-grade fevers.  When she became confused the family brought her back to the ED.   Subjective: 9/17 afebrile overnight A/O x4, still complains of RIGHT lateral abdominal pain and hip pain.   Assessment & Plan: Covid vaccination; unvaccinated   Principal Problem:   Acute hypoxemic respiratory failure due to COVID-19 [facility]) Active Problems:   Hypothyroidism   Diabetes mellitus type 2, controlled (HCC)   Essential hypertension   Hip pain, bilateral   Altered mental status   Hemorrhagic shock (HCC)   Retroperitoneal bleed   Coagulopathy (HCC)   Acute respiratory failure with hypoxia (HCC)   Pneumonia due to COVID-19 virus   Acute metabolic encephalopathy   CKD (chronic kidney disease), stage IV (HCC)   Diabetes type 2, uncontrolled (HCC)   HLD (hyperlipidemia)   Hemorrhagic shock/acute RIGHT Retroperitoneal Hematoma -9/10 transfuse 2 units PRBC -9/11 transfuse 2 units PRBC.   -9/11 CT abdomen pelvis shows large retroperitoneal hematoma see results below. Lab Results  Component Value Date   HGB 10.6 (L) 02/16/2020   HGB 10.9 (L) 02/15/2020   HGB 11.8  (L) 02/14/2020   HGB 11.2 (L) 02/13/2020   HGB 12.2 02/12/2020  -9/16 patient's H&H dropping will need to rescan abdomen and pelvis for acute bleed. -9/16 PTT/PT/INR pending  Coagulopathy 9/10 patient received her last dose of subcu heparin at approximately 6 AM  -despite this her PTT in the afternoon 9/10 remained quite elevated at approximately 85, given her life-threatening bleeding I dosed her with max dose protamine  -her PTT has corrected and is remaining stable  Acute respiratory failure with hypoxia/Covid pneumonia COVID-19 Labs  Recent Labs    02/14/20 1227 02/15/20 0410 02/16/20 0406  DDIMER 13.67* 12.52* 11.82*  FERRITIN 568* 577* 532*  LDH 288* 258* 310*  CRP 13.6* 12.7* 16.2*    No results found for: SARSCOV2NAA  Received Regeneron 9/5  -has completed a course of Remdesivir  - stopped Baricitinib while patient was intolerant of oral intake - appears clinically stable from a respiratory standpoint presently  -9/16 obtain ambulatory SPO2  Acute metabolic encephalopathy -Appears to have simply been related to acute illness and dehydration initially, and now due to recovery from shock state as well as narcotic pain meds -9/16 resolved  CKD stage IV (baseline CR~2.2)  Lab Results  Component Value Date   CREATININE 1.34 (H) 02/16/2020   CREATININE 1.28 (H) 02/15/2020   CREATININE 1.35 (H) 02/14/2020   CREATININE 1.57 (H) 02/13/2020   CREATININE 1.61 (H) 02/12/2020  -Better than baseline  RIGHT Hydronephrosis -Mild to moderate slightly increased on repeat CT  scan 9/16 CT results below.  It was not mentioned on previous CT scan 9/11. -Strict in and out -9/18 consult nephrology  -9/18 request radiologist compare 9/11 CT to 9/18 CT of abdomen and pelvis  UTI positive E. coli -9/17 susceptibilities to follow will start empiric ceftriaxone  DM type II uncontrolled -9/8 hemoglobin A1c 8= 7.7   Essential HTN -Not presently an issue  HLD -Lipid panel  pending  -Continue atorvastatin when intake improved  Hypothyroidism -Synthroid 50 mcg daily   Hypokalemia -Potassium goal> 4  Hypomagnesmia -Magnesium goal> 2  Hypophosphatemia -Phosphorus goal> 2.5   DVT prophylaxis: SCD Code Status: DNR Family Communication: 9/17 spoke with Misty Stanley (daughter) counseled on plan of care answered all questions Status is: Inpatient    Dispo: The patient is from: Home              Anticipated d/c is to: Home lives with 2 grandsons              Anticipated d/c date is: 9/18              Patient currently unstable      Consultants:    Procedures/Significant Events:  9/11 CT abdomen pelvis W0 contrast;-large retroperitoneal hematoma extending from the mid RIGHT kidney level to the RIGHT inguinal region. Hematoma volume equal 400 cubic cm. High-density hematoma within the RIGHT psoas muscle may be source of hematoma. -.Large nonobstructing calculus in the RIGHT renal pelvis. -Atrophic LEFT kidney. -Gallstone without evidence cholecystitis. 9/16 CT abdomen pelvis W0 contrast;-grossly stable large retroperitoneal hematoma (that appears to involve the right psoas muscle superiorly) extending from the level of the hemidiaphragm just posterior to the liver to the right groin. Right psoas muscle as well as the liver (uncommon but possible source of retroperitoneal hematoma) remain in the differential for etiology of the hematoma. -Stable slightly increased mild to moderate right hydronephrosis with etiology likely related to the right ureteral course along the retroperitoneal hematoma. -Interval increase in small right pleural effusion. -Interval development of trace left pleural effusion. -Nonobstructive 1.6 cm in punctate right nephrolithiasis. Atrophic left kidney.  -Cholelithiasis. At least moderate volume hiatal hernia. -L1 vertebral body compression fracture with greater than 95% height loss.  -Left lower lobe 4 mm sub solid pulmonary  nodule. -Aortic aneurysm NOS (ICD10-I71.9).    I have personally reviewed and interpreted all radiology studies and my findings are as above.  VENTILATOR SETTINGS: Nasal cannula 9/17 Flow;; 1 L/min SPO2 99%    Cultures 9/3/SARS coronavirus positive 9/16 urine culture positive E. coli  Antimicrobials: Anti-infectives (From admission, onward)   Start     Ordered Stop   02/06/20 1000  remdesivir 100 mg in sodium chloride 0.9 % 100 mL IVPB       "Followed by" Linked Group Details   02/05/20 1536 02/09/20 1028   02/05/20 1600  remdesivir 200 mg in sodium chloride 0.9% 250 mL IVPB       "Followed by" Linked Group Details   02/05/20 1536 02/05/20 2002       Devices    LINES / TUBES:      Continuous Infusions: . dextrose 5 % and 0.9% NaCl Stopped (02/15/20 1658)     Objective: Vitals:   02/15/20 2110 02/16/20 0540 02/16/20 1345 02/16/20 1844  BP: (!) 146/64 (!) 133/53 (!) 145/68   Pulse: 79 83 84   Resp: 20 16 20    Temp: 99.3 F (37.4 C) 99 F (37.2 C) 98.1 F (36.7 C)  TempSrc:   Oral   SpO2: 92% 96% 97% 95%  Weight:      Height:        Intake/Output Summary (Last 24 hours) at 02/16/2020 2028 Last data filed at 02/16/2020 1857 Gross per 24 hour  Intake 1328 ml  Output 1050 ml  Net 278 ml   Filed Weights   02/10/20 2300  Weight: 65.3 kg   Physical Exam:  General: A/O x4, positive minimal acute respiratory distress Eyes: negative scleral hemorrhage, negative anisocoria, negative icterus ENT: Negative Runny nose, negative gingival bleeding, Neck:  Negative scars, masses, torticollis, lymphadenopathy, JVD Lungs: Clear to auscultation bilaterally without wheezes or crackles Cardiovascular: Regular rate and rhythm without murmur gallop or rub normal S1 and S2 Abdomen: Positive abdominal pain right lateral aspect of abdomen from lower rib down to hip, nondistended, positive soft, bowel sounds, no rebound, no ascites, no appreciable mass Extremities: No  significant cyanosis, clubbing, or edema bilateral lower extremities Skin: Negative rashes, lesions, ulcers Psychiatric:  Negative depression, negative anxiety, negative fatigue, negative mania  Central nervous system:  Cranial nerves II through XII intact, tongue/uvula midline, all extremities muscle strength 5/5, sensation intact throughout, negative dysarthria, negative expressive aphasia, negative receptive aphasia.  .     Data Reviewed: Care during the described time interval was provided by me .  I have reviewed this patient's available data, including medical history, events of note, physical examination, and all test results as part of my evaluation.  CBC: Recent Labs  Lab 02/12/20 0507 02/13/20 0619 02/14/20 1227 02/15/20 0410 02/16/20 0406  WBC 19.3* 15.2* 14.6* 10.9* 9.9  NEUTROABS  --   --  12.6* 8.6* 7.6  HGB 12.2 11.2* 11.8* 10.9* 10.6*  HCT 37.6 34.8* 38.4 34.8* 33.6*  MCV 90.0 91.1 94.1 93.5 91.1  PLT 151 224 310 264 295   Basic Metabolic Panel: Recent Labs  Lab 02/10/20 0152 02/11/20 0456 02/12/20 0507 02/13/20 0619 02/14/20 1227 02/15/20 0410 02/16/20 0406  NA 138   < > 143 143 139 136 133*  K 4.7   < > 3.8 3.8 3.8 3.3* 4.1  CL 110   < > 111 110 106 108 104  CO2 16*   < > 22 23 21* 22 22  GLUCOSE 215*   < > 108* 45* 200* 186* 168*  BUN 84*   < > 43* 32* 22 18 18   CREATININE 2.61*   < > 1.61* 1.57* 1.35* 1.28* 1.34*  CALCIUM 8.4*   < > 8.7* 8.5* 8.8* 8.2* 8.0*  MG 2.6*  --   --   --  2.0 1.7 2.3  PHOS  --   --   --   --  1.6* 1.4* 3.3   < > = values in this interval not displayed.   GFR: Estimated Creatinine Clearance: 29.8 mL/min (A) (by C-G formula based on SCr of 1.34 mg/dL (H)). Liver Function Tests: Recent Labs  Lab 02/11/20 0456 02/12/20 0507 02/14/20 1227 02/15/20 0410 02/16/20 0406  AST 45* 42* 29 20 24   ALT 38 38 35 25 26  ALKPHOS 52 54 64 54 60  BILITOT 0.9 1.3* 1.8* 1.6* 1.6*  PROT 5.2* 5.8* 5.9* 4.9* 5.3*  ALBUMIN 2.9* 3.0*  2.7* 2.2* 2.3*   No results for input(s): LIPASE, AMYLASE in the last 168 hours. No results for input(s): AMMONIA in the last 168 hours. Coagulation Profile: Recent Labs  Lab 02/15/20 1917  INR 1.0   Cardiac Enzymes: No results for input(s): CKTOTAL, CKMB,  CKMBINDEX, TROPONINI in the last 168 hours. BNP (last 3 results) Recent Labs    12/12/19 1455  PROBNP 201.0*   HbA1C: No results for input(s): HGBA1C in the last 72 hours. CBG: Recent Labs  Lab 02/15/20 1631 02/15/20 2112 02/16/20 0745 02/16/20 1113 02/16/20 1643  GLUCAP 79 135* 178* 190* 113*   Lipid Profile: Recent Labs    02/16/20 0406  CHOL 131  HDL 29*  LDLCALC 75  TRIG 962  CHOLHDL 4.5   Thyroid Function Tests: No results for input(s): TSH, T4TOTAL, FREET4, T3FREE, THYROIDAB in the last 72 hours. Anemia Panel: Recent Labs    02/15/20 0410 02/16/20 0406  FERRITIN 577* 532*   Sepsis Labs: No results for input(s): PROCALCITON, LATICACIDVEN in the last 168 hours.  No results found for this or any previous visit (from the past 240 hour(s)).       Radiology Studies: CT ABDOMEN PELVIS WO CONTRAST  Result Date: 02/15/2020 CLINICAL DATA:  Decreasing hemoglobin and hematocrit in a patient with previous retroperitoneal bleed. EXAM: CT ABDOMEN AND PELVIS WITHOUT CONTRAST TECHNIQUE: Multidetector CT imaging of the abdomen and pelvis was performed following the standard protocol without IV contrast. COMPARISON:  CT abdomen pelvis 02/10/2020, 10/27/2006. FINDINGS: Lower chest: Interval increase in a trace to small volume right pleural effusion. Interval development of a trace left pleural effusion. Associated bilateral lower lobe passive atelectasis. Linear atelectasis within the right lower lobe. 4 mm left lower lobe subsolid nodule (2:8). Aortic root calcifications. At least 3 vessel mild coronary artery calcifications. At least moderate volume hiatal hernia. Hepatobiliary: No focal liver abnormality is seen.  Calcified gallstones within the gallbladder lumen. No gallbladder wall thickening or para cholecystic fluid. No biliary ductal dilatation. Pancreas: Unremarkable. No pancreatic ductal dilatation or surrounding inflammatory changes. Spleen: Normal in size without focal abnormality. Adrenals/Urinary Tract: No adrenal nodule bilaterally. Redemonstration of a well-defined round 1.6 cm calcified gallstone within the inferior pole of the right kidney. Associated punctate calculi is noted subjacent. Stable to slightly worsened mild to moderate right hydronephrosis. The right ureter appears to course along the retroperitoneal hematoma is poorly visualized. No ureterolithiasis identified. The left kidney is atrophic. No left hydronephrosis. No contour-deforming renal mass. No ureterolithiasis or hydroureter. The urinary bladder is unremarkable. Stomach/Bowel: Stomach is within normal limits. Appendix appears normal. No evidence of bowel wall thickening, distention, or inflammatory changes. Scattered sigmoid diverticulosis. Vascular/Lymphatic: Stable ectatic infrarenal abdominal aorta with a caliber of 3.5 cm on axial imaging (3:32). No iliac artery aneurysm. Severe atherosclerotic plaque of the aorta and its branches. No abdominal, pelvic, or inguinal lymphadenopathy. Reproductive: Uterus and bilateral adnexa are unremarkable. Other: Similar-appearing bilobed retroperitoneal hematoma that originates at the level of the hemidiaphragm open (2:22), courses along the right psoas muscle and medial to the right renal space as it travels inferiorly to terminate at the level of right groin open (2:60). Superior-most largest component measuring 7.5 x 7.7 cm on axial imaging (2:39) and inferior-most largest component measuring 10.5 x 5.2 cm on axial imaging (2:53, 5:42). The hematoma appears to involve the right psoas muscle superiorly (2:32-40). A separate the contiguous component of the hematoma is again noted laterally and measures  up to 7.6 cm on axial imaging (2:44). Similar appearing area of hyperdensity measuring 3 cm on axial imaging and extending approximately 10 cm in the craniocaudal dimension likely represents clotted blood. Similar area of layering hyperdensity posterior to the liver along the right hemidiaphragm best imaged on sagittal view (6:41). No simple free fluid  ascites. No intraperitoneal free gas. No organized fluid collection. Musculoskeletal: Subcutaneous soft tissue edema and stranding along the deep abdominal wall. Diffusely decreased bone density. Multilevel degenerative changes of the spine. Similar-appearing grade 2 anterolisthesis of L5 on S1. Similar-appearing compression fracture of the L1 vertebral body with greater than 95% height loss. No acute displaced fracture. No suspicious lytic or blastic osseous lesions. IMPRESSION: 1. Grossly stable large retroperitoneal hematoma (that appears to involve the right psoas muscle superiorly) extending from the level of the hemidiaphragm just posterior to the liver to the right groin. Right psoas muscle as well as the liver (uncommon but possible source of retroperitoneal hematoma) remain in the differential for etiology of the hematoma. 2. Stable slightly increased mild to moderate right hydronephrosis with etiology likely related to the right ureteral course along the retroperitoneal hematoma. 3. Interval increase in small right pleural effusion. 4. Interval development of trace left pleural effusion. 5. Other imaging findings of potential clinical significance: Nonobstructive 1.6 cm in punctate right nephrolithiasis. Atrophic left kidney. Cholelithiasis. At least moderate volume hiatal hernia. L1 vertebral body compression fracture with greater than 95% height loss. Left lower lobe 4 mm sub solid pulmonary nodule. 6. Aortic aneurysm NOS (ICD10-I71.9). 7. Aortic Atherosclerosis (ICD10-I70.0). Electronically Signed   By: Tish Frederickson M.D.   On: 02/15/2020 19:12         Scheduled Meds: . insulin aspart  0-9 Units Subcutaneous TID WC  . levothyroxine  50 mcg Oral Q0600  . mouth rinse  15 mL Mouth Rinse BID  . montelukast  10 mg Oral QHS   Continuous Infusions: . dextrose 5 % and 0.9% NaCl Stopped (02/15/20 1658)     LOS: 11 days    Time spent:40 min    Brack Shaddock, Roselind Messier, MD [facility] Pager [phone]  If 7PM-7AM, please contact night-coverage [email or web address] Password [facility] 02/16/2020, 8:28 PM

## 2020-02-17 ENCOUNTER — Encounter (HOSPITAL_COMMUNITY): Payer: Self-pay | Admitting: Internal Medicine

## 2020-02-17 LAB — URINE CULTURE: Culture: >=100000 — AB

## 2020-02-17 LAB — GLUCOSE, CAPILLARY
Glucose-Capillary: 116 mg/dL — ABNORMAL HIGH (ref 70–99)
Glucose-Capillary: 156 mg/dL — ABNORMAL HIGH (ref 70–99)
Glucose-Capillary: 158 mg/dL — ABNORMAL HIGH (ref 70–99)
Glucose-Capillary: 168 mg/dL — ABNORMAL HIGH (ref 70–99)
Glucose-Capillary: 79 mg/dL (ref 70–99)

## 2020-02-17 LAB — COMPREHENSIVE METABOLIC PANEL
ALT: 24 U/L (ref 0–44)
AST: 23 U/L (ref 15–41)
Albumin: 2.4 g/dL — ABNORMAL LOW (ref 3.5–5.0)
Alkaline Phosphatase: 68 U/L (ref 38–126)
Anion gap: 7 (ref 5–15)
BUN: 16 mg/dL (ref 8–23)
CO2: 24 mmol/L (ref 22–32)
Calcium: 8.5 mg/dL — ABNORMAL LOW (ref 8.9–10.3)
Chloride: 109 mmol/L (ref 98–111)
Creatinine, Ser: 1.41 mg/dL — ABNORMAL HIGH (ref 0.44–1.00)
GFR calc Af Amer: 40 mL/min — ABNORMAL LOW (ref >60)
GFR calc non Af Amer: 34 mL/min — ABNORMAL LOW (ref >60)
Glucose, Bld: 185 mg/dL — ABNORMAL HIGH (ref 70–99)
Potassium: 3.3 mmol/L — ABNORMAL LOW (ref 3.5–5.1)
Sodium: 140 mmol/L (ref 135–145)
Total Bilirubin: 1.5 mg/dL — ABNORMAL HIGH (ref 0.3–1.2)
Total Protein: 5.5 g/dL — ABNORMAL LOW (ref 6.5–8.1)

## 2020-02-17 LAB — CBC WITH DIFFERENTIAL/PLATELET
Abs Immature Granulocytes: 0.08 10*3/uL — ABNORMAL HIGH (ref 0.00–0.07)
Basophils Absolute: 0 10*3/uL (ref 0.0–0.1)
Basophils Relative: 0 %
Eosinophils Absolute: 0.1 10*3/uL (ref 0.0–0.5)
Eosinophils Relative: 1 %
HCT: 33.3 % — ABNORMAL LOW (ref 36.0–46.0)
Hemoglobin: 10.7 g/dL — ABNORMAL LOW (ref 12.0–15.0)
Immature Granulocytes: 1 %
Lymphocytes Relative: 8 %
Lymphs Abs: 0.9 10*3/uL (ref 0.7–4.0)
MCH: 29.5 pg (ref 26.0–34.0)
MCHC: 32.1 g/dL (ref 30.0–36.0)
MCV: 91.7 fL (ref 80.0–100.0)
Monocytes Absolute: 1 10*3/uL (ref 0.1–1.0)
Monocytes Relative: 9 %
Neutro Abs: 8.8 10*3/uL — ABNORMAL HIGH (ref 1.7–7.7)
Neutrophils Relative %: 81 %
Platelets: 420 10*3/uL — ABNORMAL HIGH (ref 150–400)
RBC: 3.63 MIL/uL — ABNORMAL LOW (ref 3.87–5.11)
RDW: 15.1 % (ref 11.5–15.5)
WBC: 10.9 10*3/uL — ABNORMAL HIGH (ref 4.0–10.5)
nRBC: 0 % (ref 0.0–0.2)

## 2020-02-17 LAB — LACTATE DEHYDROGENASE: LDH: 339 U/L — ABNORMAL HIGH (ref 98–192)

## 2020-02-17 LAB — C-REACTIVE PROTEIN: CRP: 16.7 mg/dL — ABNORMAL HIGH (ref <1.0)

## 2020-02-17 LAB — MAGNESIUM: Magnesium: 2.2 mg/dL (ref 1.7–2.4)

## 2020-02-17 LAB — D-DIMER, QUANTITATIVE: D-Dimer, Quant: 11.06 ug/mL-FEU — ABNORMAL HIGH (ref 0.00–0.50)

## 2020-02-17 LAB — FERRITIN: Ferritin: 651 ng/mL — ABNORMAL HIGH (ref 11–307)

## 2020-02-17 LAB — PHOSPHORUS: Phosphorus: 2 mg/dL — ABNORMAL LOW (ref 2.5–4.6)

## 2020-02-17 MED ORDER — POTASSIUM CHLORIDE CRYS ER 20 MEQ PO TBCR
50.0000 meq | EXTENDED_RELEASE_TABLET | Freq: Once | ORAL | Status: AC
  Administered 2020-02-17: 50 meq via ORAL
  Filled 2020-02-17: qty 2

## 2020-02-17 NOTE — Consult Note (Signed)
Reason for Consult: Right Hydronephrosis 2/2 Retroperitoneal Hematoma, Stage 4 renal insuficency / Left Renal Atrophy, Large Right Renal Stone  Referring Physician: Dia Crawford MD  Tami Richards is an 85 y.o. female.   HPI:   1 - Right Hydronephrosis 2/2 Retroperitoneal Hematoma- moderate right hydro by CT 9/16 on restaging of retroperitoneal hematoma that developes while anticoagulated for COVID treatment. Recived 4u blood and Hgb subsequently stabilzed. hematoam stable 9/16 compared to 9/11.  Rt kidney dominant. Ipsilateral non-osbtructing stone.  2 - Stage 4 renal insuficency / Left Renal Atrophy - Cr 1.5-2 at baseline. CT 01/2020 with non-osbtructing Rt renal pelvis stone and left renal atrohpy. Cr no 1.4.   3 - Large Right Renal Stone - 1.6cm incidental Rt renal pelvis stone non-osbtructing on CT 2021. Severel prior episodes managed medically.  4 - Bacteruria - pan-sensitive e.coli by Dorma Russell 9/16. Placed on Rocephin.   PMH sig for C19(unvaccinated). Lives with grandsons at baseline but indepenant in all ADL's. Her daughter [name] at [phone] is very involved and lives locally.   Today "Tami Richards" is seen in consultation for above. She is nearing end of hospital stay for C19 and has made dramatic recovery, getting close to discharge. No pulm complaints at present.   Past Medical History:  Diagnosis Date  . Acid reflux   . COVID-19   . Hypertension   . Kidney stones     History reviewed. No pertinent surgical history.  Family History  Problem Relation Age of Onset  . Hypertension Other     Social History:  reports that she has quit smoking. She has never used smokeless tobacco. She reports that she does not drink alcohol and does not use drugs.  Allergies: No Known Allergies  Medications: I have reviewed the patient's current medications.  Results for orders placed or performed during the hospital encounter of 02/05/20 (from the past 48 hour(s))  Glucose,  capillary     Status: None   Collection Time: 02/15/20  4:31 PM  Result Value Ref Range   Glucose-Capillary 79 70 - 99 mg/dL    Comment: Glucose reference range applies only to samples taken after fasting for at least 8 hours.  Urinalysis, Routine w reflex microscopic     Status: Abnormal   Collection Time: 02/15/20  6:33 PM  Result Value Ref Range   Color, Urine YELLOW YELLOW   APPearance CLEAR CLEAR   Specific Gravity, Urine 1.011 1.005 - 1.030   pH 6.0 5.0 - 8.0   Glucose, UA NEGATIVE NEGATIVE mg/dL   Hgb urine dipstick MODERATE (A) NEGATIVE   Bilirubin Urine NEGATIVE NEGATIVE   Ketones, ur NEGATIVE NEGATIVE mg/dL   Protein, ur NEGATIVE NEGATIVE mg/dL   Nitrite NEGATIVE NEGATIVE   Leukocytes,Ua LARGE (A) NEGATIVE   RBC / HPF 0-5 0 - 5 RBC/hpf   WBC, UA >50 (H) 0 - 5 WBC/hpf   Bacteria, UA RARE (A) NONE SEEN   Squamous Epithelial / LPF 0-5 0 - 5    Comment: Performed at [facility], [place] [address] [place], [place] [address]  Culture, Urine     Status: Abnormal   Collection Time: 02/15/20  6:33 PM   Specimen: Urine, Clean Catch  Result Value Ref Range   Specimen Description      URINE, CLEAN CATCH Performed at [facility], [place] [address] [place], [place] [address]    Special Requests      NONE Performed at [facility], [place]  [address] [place], [place] [address]    Culture >=100,000 COLONIES/mL ESCHERICHIA COLI (A)    Report Status 02/17/2020 FINAL    Organism ID, Bacteria ESCHERICHIA COLI (A)       Susceptibility   Escherichia coli - MIC*    AMPICILLIN <=2 SENSITIVE Sensitive     CEFAZOLIN <=4 SENSITIVE Sensitive     CEFTRIAXONE <=0.25 SENSITIVE Sensitive     CIPROFLOXACIN <=0.25 SENSITIVE Sensitive     GENTAMICIN <=1 SENSITIVE Sensitive     IMIPENEM <=0.25 SENSITIVE Sensitive     NITROFURANTOIN <=16 SENSITIVE Sensitive     TRIMETH/SULFA <=20 SENSITIVE Sensitive     AMPICILLIN/SULBACTAM <=2 SENSITIVE  Sensitive     PIP/TAZO <=4 SENSITIVE Sensitive     * >=100,000 COLONIES/mL ESCHERICHIA COLI  APTT     Status: Abnormal   Collection Time: 02/15/20  7:17 PM  Result Value Ref Range   aPTT <20 (L) 24 - 36 seconds    Comment: Performed at [facility], [place] [address], [place], [place] [address]  Protime-INR     Status: None   Collection Time: 02/15/20  7:17 PM  Result Value Ref Range   Prothrombin Time 12.6 11.4 - 15.2 seconds   INR 1.0 0.8 - 1.2    Comment: (NOTE) INR goal varies based on device and disease states. Performed at [facility], [place] [address], [place], [place] [address]   Glucose, capillary     Status: Abnormal   Collection Time: 02/15/20  9:12 PM  Result Value Ref Range   Glucose-Capillary 135 (H) 70 - 99 mg/dL    Comment: Glucose reference range applies only to samples taken after fasting for at least 8 hours.  Comprehensive metabolic panel     Status: Abnormal   Collection Time: 02/16/20  4:06 AM  Result Value Ref Range   Sodium 133 (L) 135 - 145 mmol/L   Potassium 4.1 3.5 - 5.1 mmol/L    Comment: DELTA CHECK NOTED   Chloride 104 98 - 111 mmol/L   CO2 22 22 - 32 mmol/L   Glucose, Bld 168 (H) 70 - 99 mg/dL    Comment: Glucose reference range applies only to samples taken after fasting for at least 8 hours.   BUN 18 8 - 23 mg/dL   Creatinine, Ser 1.34 (H) 0.44 - 1.00 mg/dL   Calcium 8.0 (L) 8.9 - 10.3 mg/dL   Total Protein 5.3 (L) 6.5 - 8.1 g/dL   Albumin 2.3 (L) 3.5 - 5.0 g/dL   AST 24 15 - 41 U/L   ALT 26 0 - 44 U/L   Alkaline Phosphatase 60 38 - 126 U/L   Total Bilirubin 1.6 (H) 0.3 - 1.2 mg/dL   GFR calc non Af Amer 37 (L) >60 mL/min   GFR calc Af Amer 42 (L) >60 mL/min   Anion gap 7 5 - 15    Comment: Performed at [facility], [place] [address] [place], [place] [address]  Magnesium     Status: None   Collection Time: 02/16/20  4:06 AM  Result Value Ref Range   Magnesium 2.3 1.7 - 2.4 mg/dL     Comment: Performed at [facility], [place] [address] [place], [place] [address]  C-reactive protein     Status: Abnormal   Collection Time: 02/16/20  4:06 AM  Result Value Ref Range   CRP 16.2 (H) <1.0 mg/dL    Comment: Performed at [facility], [address] [place], [place] [address]  Lactate dehydrogenase     Status: Abnormal   Collection Time: 02/16/20  4:06 AM  Result Value Ref Range   LDH 310 (H) 98 - 192 U/L    Comment: Performed at [facility], [place] [address], [place], [place] [address]  Ferritin     Status: Abnormal   Collection Time: 02/16/20  4:06 AM  Result Value Ref Range   Ferritin 532 (H) 11 - 307 ng/mL    Comment: Performed at [facility], [place] [address] [place], [place] [address]  D-dimer, quantitative (not at [facility])     Status: Abnormal   Collection Time: 02/16/20  4:06 AM  Result Value Ref Range   D-Dimer, Quant 11.82 (H) 0.00 - 0.50 ug/mL-FEU    Comment: (NOTE) At the manufacturer cut-off of 0.50 ug/mL FEU, this assay has been documented to exclude PE with a sensitivity and negative predictive value of 97 to 99%.  At this time, this assay has not been approved by the FDA to exclude DVT/VTE. Results should be correlated with clinical presentation. Performed at [facility], [place] [address] [place],  [address]   CBC with Differential/Platelet     Status: Abnormal   Collection Time: 02/16/20  4:06 AM  Result Value Ref Range   WBC 9.9 4.0 - 10.5 K/uL   RBC 3.69 (L) 3.87 - 5.11 MIL/uL   Hemoglobin 10.6 (L) 12.0 - 15.0 g/dL   HCT 33.6 (L) 36 - 46 %   MCV 91.1 80.0 - 100.0 fL   MCH 28.7 26.0 - 34.0 pg   MCHC 31.5 30.0 - 36.0 g/dL   RDW 15.5 11.5 - 15.5 %   Platelets 295 150 - 400 K/uL   nRBC 0.0 0.0 - 0.2 %   Neutrophils Relative % 76 %   Neutro Abs 7.6 1.7 - 7.7 K/uL   Lymphocytes Relative 9 %   Lymphs Abs 0.9 0.7 - 4.0 K/uL   Monocytes Relative 12 %    Monocytes Absolute 1.2 (H) 0 - 1 K/uL   Eosinophils Relative 1 %   Eosinophils Absolute 0.1 0 - 0 K/uL   Basophils Relative 0 %   Basophils Absolute 0.0 0 - 0 K/uL   Immature Granulocytes 2 %   Abs Immature Granulocytes 0.16 (H) 0.00 - 0.07 K/uL    Comment: Performed at [facility], [place] [address], [place],  [address]  Phosphorus     Status: None   Collection Time: 02/16/20  4:06 AM  Result Value Ref Range   Phosphorus 3.3 2.5 - 4.6 mg/dL    Comment: Performed at [facility], [place] [address] [place],  [address]  Lipid panel     Status: Abnormal   Collection Time: 02/16/20  4:06 AM  Result Value Ref Range   Cholesterol 131 0 - 200 mg/dL   Triglycerides 134 <150 mg/dL   HDL 29 (L) >40 mg/dL   Total CHOL/HDL Ratio 4.5 RATIO   VLDL 27 0 - 40 mg/dL   LDL Cholesterol 75 0 - 99 mg/dL    Comment:        Total Cholesterol/HDL:CHD Risk Coronary Heart Disease Risk Table                     Men   Women  1/2 Average Risk   3.4   3.3  Average Risk       5.0  4.4  2 X Average Risk   9.6   7.1  3 X Average Risk  23.4   11.0        Use the calculated Patient Ratio above and the CHD Risk Table to determine the patient's CHD Risk.        ATP III CLASSIFICATION (LDL):  <100     mg/dL   Optimal  100-129  mg/dL   Near or Above                    Optimal  130-159  mg/dL   Borderline  160-189  mg/dL   High  >190     mg/dL   Very High Performed at [place] [address] [place], [place] [address]   Glucose, capillary     Status: Abnormal   Collection Time: 02/16/20  7:45 AM  Result Value Ref Range   Glucose-Capillary 178 (H) 70 - 99 mg/dL    Comment: Glucose reference range applies only to samples taken after fasting for at least 8 hours.  Glucose, capillary     Status: Abnormal   Collection Time: 02/16/20 11:13 AM  Result Value Ref Range   Glucose-Capillary 190 (H) 70 - 99 mg/dL    Comment: Glucose reference  range applies only to samples taken after fasting for at least 8 hours.  Glucose, capillary     Status: Abnormal   Collection Time: 02/16/20  4:43 PM  Result Value Ref Range   Glucose-Capillary 113 (H) 70 - 99 mg/dL    Comment: Glucose reference range applies only to samples taken after fasting for at least 8 hours.  Glucose, capillary     Status: Abnormal   Collection Time: 02/16/20  9:36 PM  Result Value Ref Range   Glucose-Capillary 166 (H) 70 - 99 mg/dL    Comment: Glucose reference range applies only to samples taken after fasting for at least 8 hours.  Comprehensive metabolic panel     Status: Abnormal   Collection Time: 02/17/20  4:39 AM  Result Value Ref Range   Sodium 140 135 - 145 mmol/L    Comment: DELTA CHECK NOTED   Potassium 3.3 (L) 3.5 - 5.1 mmol/L    Comment: DELTA CHECK NOTED   Chloride 109 98 - 111 mmol/L   CO2 24 22 - 32 mmol/L   Glucose, Bld 185 (H) 70 - 99 mg/dL    Comment: Glucose reference range applies only to samples taken after fasting for at least 8 hours.   BUN 16 8 - 23 mg/dL   Creatinine, Ser 1.41 (H) 0.44 - 1.00 mg/dL   Calcium 8.5 (L) 8.9 - 10.3 mg/dL   Total Protein 5.5 (L) 6.5 - 8.1 g/dL   Albumin 2.4 (L) 3.5 - 5.0 g/dL   AST 23 15 - 41 U/L   ALT 24 0 - 44 U/L   Alkaline Phosphatase 68 38 - 126 U/L   Total Bilirubin 1.5 (H) 0.3 - 1.2 mg/dL   GFR calc non Af Amer 34 (L) >60 mL/min   GFR calc Af Amer 40 (L) >60 mL/min   Anion gap 7 5 - 15    Comment: Performed at [facility], [place] [address] [place], [place] [address]  Magnesium     Status: None   Collection Time: 02/17/20  4:39 AM  Result Value Ref Range   Magnesium 2.2 1.7 - 2.4 mg/dL    Comment: Performed at [facility],  [place] [address], [place], [place] [address]  C-reactive protein     Status: Abnormal   Collection Time: 02/17/20  4:39 AM  Result Value Ref Range   CRP 16.7 (H) <1.0 mg/dL    Comment: Performed at [facility], [place] [address] [place], [place] [address]  Lactate dehydrogenase     Status: Abnormal   Collection Time: 02/17/20  4:39 AM  Result Value Ref Range   LDH 339 (H) 98 - 192 U/L    Comment: Performed at [facility], [place] [address] [place], [place] [address]  Ferritin     Status: Abnormal   Collection Time: 02/17/20  4:39 AM  Result Value Ref Range   Ferritin 651 (H) 11 - 307 ng/mL    Comment: Performed at [facility], [place] [address], [place], [place] [address]  D-dimer, quantitative (not at [facility])     Status: Abnormal   Collection Time: 02/17/20  4:39 AM  Result Value Ref Range   D-Dimer, Quant 11.06 (H) 0.00 - 0.50 ug/mL-FEU    Comment: (NOTE) At the manufacturer cut-off of 0.50 ug/mL FEU, this assay has been documented to exclude PE with a sensitivity and negative predictive value of 97 to 99%.  At this time, this assay has not been approved by the FDA to exclude DVT/VTE. Results should be correlated with clinical presentation. Performed at [facility], [place] [address], [place], [place] [address]   CBC with Differential/Platelet     Status: Abnormal   Collection Time: 02/17/20  4:39 AM  Result Value Ref Range   WBC 10.9 (H) 4.0 - 10.5 K/uL   RBC 3.63 (L) 3.87 - 5.11 MIL/uL   Hemoglobin 10.7 (L) 12.0 - 15.0 g/dL   HCT 33.3 (L) 36 - 46 %   MCV 91.7 80.0 - 100.0 fL   MCH 29.5 26.0 - 34.0 pg   MCHC 32.1 30.0 - 36.0 g/dL   RDW 15.1 11.5 - 15.5 %   Platelets 420 (H) 150 - 400 K/uL   nRBC 0.0 0.0 - 0.2 %   Neutrophils Relative % 81 %   Neutro Abs 8.8 (H) 1.7 - 7.7 K/uL   Lymphocytes Relative 8 %   Lymphs Abs 0.9 0.7 - 4.0 K/uL   Monocytes Relative 9 %   Monocytes Absolute 1.0 0 - 1 K/uL   Eosinophils Relative 1 %   Eosinophils Absolute 0.1 0 - 0 K/uL   Basophils Relative 0 %   Basophils Absolute 0.0 0 - 0 K/uL   Immature Granulocytes 1 %   Abs Immature Granulocytes 0.08 (H) 0.00 - 0.07 K/uL    Comment: Performed at [facility], [place] [address] [place], [place] [address]  Phosphorus     Status: Abnormal   Collection Time: 02/17/20  4:39 AM  Result Value Ref Range   Phosphorus 2.0 (L) 2.5 - 4.6 mg/dL    Comment: Performed at [facility], [place] [address], [place], [place] [address]  Glucose, capillary     Status: Abnormal   Collection Time: 02/17/20  7:55 AM  Result Value Ref Range   Glucose-Capillary 168 (H) 70 - 99 mg/dL    Comment: Glucose reference range applies only to samples taken after fasting for at least 8 hours.  Glucose, capillary     Status: Abnormal   Collection Time: 02/17/20 11:39 AM  Result Value Ref Range   Glucose-Capillary 158 (H) 70 - 99 mg/dL  Comment: Glucose reference range applies only to samples taken after fasting for at least 8 hours.    CT ABDOMEN PELVIS WO CONTRAST  Result Date: 02/15/2020 CLINICAL DATA:  Decreasing hemoglobin and hematocrit in a patient with previous retroperitoneal bleed. EXAM: CT ABDOMEN AND PELVIS WITHOUT CONTRAST TECHNIQUE: Multidetector CT imaging of the abdomen and pelvis was performed following the standard protocol without IV contrast. COMPARISON:  CT abdomen pelvis 02/10/2020, 10/27/2006. FINDINGS: Lower chest: Interval increase in a trace to small volume right pleural effusion. Interval development of a trace left pleural effusion. Associated bilateral lower lobe passive atelectasis. Linear atelectasis within the right lower lobe. 4 mm left lower lobe subsolid nodule (2:8). Aortic root calcifications. At least 3 vessel mild coronary artery calcifications. At least moderate volume hiatal hernia. Hepatobiliary: No focal liver abnormality is seen. Calcified gallstones within the gallbladder lumen. No gallbladder wall thickening or para cholecystic fluid. No biliary ductal dilatation. Pancreas: Unremarkable. No pancreatic ductal dilatation or surrounding inflammatory changes. Spleen: Normal in size without focal  abnormality. Adrenals/Urinary Tract: No adrenal nodule bilaterally. Redemonstration of a well-defined round 1.6 cm calcified gallstone within the inferior pole of the right kidney. Associated punctate calculi is noted subjacent. Stable to slightly worsened mild to moderate right hydronephrosis. The right ureter appears to course along the retroperitoneal hematoma is poorly visualized. No ureterolithiasis identified. The left kidney is atrophic. No left hydronephrosis. No contour-deforming renal mass. No ureterolithiasis or hydroureter. The urinary bladder is unremarkable. Stomach/Bowel: Stomach is within normal limits. Appendix appears normal. No evidence of bowel wall thickening, distention, or inflammatory changes. Scattered sigmoid diverticulosis. Vascular/Lymphatic: Stable ectatic infrarenal abdominal aorta with a caliber of 3.5 cm on axial imaging (3:32). No iliac artery aneurysm. Severe atherosclerotic plaque of the aorta and its branches. No abdominal, pelvic, or inguinal lymphadenopathy. Reproductive: Uterus and bilateral adnexa are unremarkable. Other: Similar-appearing bilobed retroperitoneal hematoma that originates at the level of the hemidiaphragm open (2:22), courses along the right psoas muscle and medial to the right renal space as it travels inferiorly to terminate at the level of right groin open (2:60). Superior-most largest component measuring 7.5 x 7.7 cm on axial imaging (2:39) and inferior-most largest component measuring 10.5 x 5.2 cm on axial imaging (2:53, 5:42). The hematoma appears to involve the right psoas muscle superiorly (2:32-40). A separate the contiguous component of the hematoma is again noted laterally and measures up to 7.6 cm on axial imaging (2:44). Similar appearing area of hyperdensity measuring 3 cm on axial imaging and extending approximately 10 cm in the craniocaudal dimension likely represents clotted blood. Similar area of layering hyperdensity posterior to the liver  along the right hemidiaphragm best imaged on sagittal view (6:41). No simple free fluid ascites. No intraperitoneal free gas. No organized fluid collection. Musculoskeletal: Subcutaneous soft tissue edema and stranding along the deep abdominal wall. Diffusely decreased bone density. Multilevel degenerative changes of the spine. Similar-appearing grade 2 anterolisthesis of L5 on S1. Similar-appearing compression fracture of the L1 vertebral body with greater than 95% height loss. No acute displaced fracture. No suspicious lytic or blastic osseous lesions. IMPRESSION: 1. Grossly stable large retroperitoneal hematoma (that appears to involve the right psoas muscle superiorly) extending from the level of the hemidiaphragm just posterior to the liver to the right groin. Right psoas muscle as well as the liver (uncommon but possible source of retroperitoneal hematoma) remain in the differential for etiology of the hematoma. 2. Stable slightly increased mild to moderate right hydronephrosis with etiology likely related to the  right ureteral course along the retroperitoneal hematoma. 3. Interval increase in small right pleural effusion. 4. Interval development of trace left pleural effusion. 5. Other imaging findings of potential clinical significance: Nonobstructive 1.6 cm in punctate right nephrolithiasis. Atrophic left kidney. Cholelithiasis. At least moderate volume hiatal hernia. L1 vertebral body compression fracture with greater than 95% height loss. Left lower lobe 4 mm sub solid pulmonary nodule. 6. Aortic aneurysm NOS (ICD10-I71.9). 7. Aortic Atherosclerosis (ICD10-I70.0). Electronically Signed   By: Iven Finn M.D.   On: 02/15/2020 19:12    Review of Systems  Constitutional: Negative for chills and fatigue.  Genitourinary: Positive for flank pain.  All other systems reviewed and are negative.  Blood pressure (!) 147/49, pulse 77, temperature 97.9 F (36.6 C), resp. rate 20, height 5\' 6"  (1.676 m),  weight 65.3 kg, SpO2 96 %. Physical Exam Vitals reviewed.  Constitutional:      Comments: Very mentally astute for age. Pleasant and spry.   HENT:     Nose: Nose normal.  Eyes:     Pupils: Pupils are equal, round, and reactive to light.  Cardiovascular:     Rate and Rhythm: Normal rate.  Pulmonary:     Effort: Pulmonary effort is normal.  Abdominal:     General: Abdomen is flat.  Genitourinary:    Comments: Mild Rt CVAT at present.  Musculoskeletal:        General: Normal range of motion.     Cervical back: Normal range of motion.  Skin:    General: Skin is warm.  Neurological:     General: No focal deficit present.     Mental Status: She is alert.  Psychiatric:        Mood and Affect: Mood normal.     Assessment/Plan:  1 - Right Hydronephrosis 2/2 Retroperitoneal Hematoma- discussed options of no intervention (risks of progressive renal decline, increased risk of obstructed pyelo) v. Rt stent and then exchange / ureteroscopy for stone in about 37mos with goals of max renal preservation. Also disucssed with daughter by phone. They opt for stenting. As she is not NPO, rec tomorrow AM. NPO p MN tonight. Risks, benefits, peri-op course discussed.   2 - Stage 4 renal insuficency / Left Renal Atrophy - plan as per above, stenting to preserve GFR of dominant kdiney.  3 - Large Right Renal Stone - plan as per above with goal of stone free via staged approach.  4 - Bacteruria - Agree with rocephing. Plan as per above to reduce risk of progression to obstrcting pyelo.   Please call me directly with questions anytime.  [name] 02/17/2020, 12:13 PM

## 2020-02-17 NOTE — Progress Notes (Signed)
Physical Therapy Treatment Patient Details Name: Tami Richards MRN: [id number] DOB: Aug 18, 1934 Today's Date: 02/17/2020    History of Present Illness 85 year old female who is hard of hearing and a poor historian with a past medical history of diabetes, CKD 4, hypothyroidism, hypertension and hyperlipidemia who is unvaccinated against COVID-19 who was brought in by EMS from home for change in mental status and weakness. She is being treated for COVID. Patient s/p rapid response for hemorrhagic shock on 9/10 and required emergent PRBC transfusions. Therapy reorderd 9/14.    PT Comments    The patient ambulated  X 60' in room on RW and RA. SEnsor intermittently picking up Saturations of 90%. At rest SPO2 95%( finger). Patient will require close guarding during ambulation after retruning home. Patient reports has 2 grandsons who assist. Continue PT.  Follow Up Recommendations  Home health PT;Supervision/Assistance - 24 hour     Equipment Recommendations  None recommended by PT    Recommendations for Other Services       Precautions / Restrictions Precautions Precaution Comments: monitor sats    Mobility  Bed Mobility Overal bed mobility: Modified Independent             General bed mobility comments: did not require extra assistance  Transfers Overall transfer level: Needs assistance Equipment used: Rolling walker (2 wheeled)   Sit to Stand: Min guard         General transfer comment: safe min guard from bed, patient stating" I want to do it myself"  Ambulation/Gait Ambulation/Gait assistance: Min assist Gait Distance (Feet): 60 Feet Assistive device: Rolling walker (2 wheeled) Gait Pattern/deviations: Step-to pattern;Step-through pattern Gait velocity: decr   General Gait Details: pt amb. around room, turned Rw no balance loss.   Stairs             Wheelchair Mobility    Modified Rankin (Stroke Patients Only)       Balance    Sitting-balance support: No upper extremity supported;Feet supported Sitting balance-Leahy Scale: Good     Standing balance support: Bilateral upper extremity supported Standing balance-Leahy Scale: Fair Standing balance comment: with RW                            Cognition Arousal/Alertness: Awake/alert   Overall Cognitive Status: No family/caregiver present to determine baseline cognitive functioning                                 General Comments: awake, follows directions and participates      Exercises      General Comments        Pertinent Vitals/Pain Pain Assessment: No/denies pain    Home Living                      Prior Function            PT Goals (current goals can now be found in the care plan section) Progress towards PT goals: Progressing toward goals    Frequency    Min 3X/week      PT Plan Current plan remains appropriate    Co-evaluation              AM-PAC PT "6 Clicks" Mobility   Outcome Measure  Help needed turning from your back to your side while in a flat bed without using bedrails?: None Help needed  moving from lying on your back to sitting on the side of a flat bed without using bedrails?: None Help needed moving to and from a bed to a chair (including a wheelchair)?: A Little Help needed standing up from a chair using your arms (e.g., wheelchair or bedside chair)?: A Little Help needed to walk in hospital room?: A Little Help needed climbing 3-5 steps with a railing? : A Lot 6 Click Score: 19    End of Session Equipment Utilized During Treatment: Gait belt Activity Tolerance: Patient tolerated treatment well Patient left: in chair;with call bell/phone within reach Nurse Communication: Mobility status;Other (comment) PT Visit Diagnosis: Unsteadiness on feet (R26.81);Muscle weakness (generalized) (M62.81)     Time: 2158-7276 PT Time Calculation (min) (ACUTE ONLY): 29  min  Charges:  $Gait Training: 23-37 mins                     [place] Pager [phone] Office [phone]    Claretha Cooper 02/17/2020, 12:56 PM

## 2020-02-17 NOTE — Progress Notes (Signed)
SATURATION QUALIFICATIONS: (This note is used to comply with regulatory documentation for home oxygen)  Patient Saturations on Room Air at Rest = 95%  Patient Saturations on Room Air while Ambulating =90%  Patient Saturations on   Liters of oxygen while Ambulating = % NT as did not need O2.  Please briefly explain why patient needs home oxygen: saturation remained >90%. [place] Pager [phone] Office [phone]

## 2020-02-17 NOTE — Progress Notes (Signed)
OT Cancellation Note  Patient Details Name: Tami Richards MRN: [id number] DOB: 04/02/1935   Cancelled Treatment:    Reason Eval/Treat Not Completed: Other (comment) Upon arrival patient's room patient working with physical therapy. Will re-attempt as schedule permits.  [name] OT OT pager: [phone]  Rosemary Holms 02/17/2020, 12:58 PM

## 2020-02-17 NOTE — Anesthesia Preprocedure Evaluation (Addendum)
Anesthesia Evaluation  Patient identified by MRN, date of birth, ID band Patient awake    Reviewed: Allergy & Precautions, NPO status , Patient's Chart, lab work & pertinent test results  History of Anesthesia Complications Negative for: history of anesthetic complications  Airway Mallampati: I  TM Distance: >3 FB Neck ROM: Full    Dental  (+) Edentulous Upper, Edentulous Lower   Pulmonary pneumonia (COVID), COPD,  COPD inhaler, former smoker,  02/02/2020 COVID 19 POSITIVE   breath sounds clear to auscultation       Cardiovascular hypertension, (-) angina Rhythm:Regular Rate:Normal     Neuro/Psych Encephalopathy with COVID 19: resolving    GI/Hepatic Neg liver ROS, GERD  Controlled,  Endo/Other  diabetes (glu 175), Oral Hypoglycemic AgentsHypothyroidism   Renal/GU Renal InsufficiencyRenal disease     Musculoskeletal   Abdominal   Peds  Hematology   Anesthesia Other Findings   Reproductive/Obstetrics                            Anesthesia Physical Anesthesia Plan  ASA: III  Anesthesia Plan: General   Post-op Pain Management:    Induction: Intravenous and Rapid sequence  PONV Risk Score and Plan: 3 and Ondansetron, Dexamethasone and Treatment may vary due to age or medical condition  Airway Management Planned: Oral ETT  Additional Equipment: None  Intra-op Plan:   Post-operative Plan: Extubation in OR  Informed Consent: I have reviewed the patients History and Physical, chart, labs and discussed the procedure including the risks, benefits and alternatives for the proposed anesthesia with the patient or authorized representative who has indicated his/her understanding and acceptance.   Patient has DNR.  Discussed DNR with patient and Suspend DNR.     Plan Discussed with: CRNA and Surgeon  Anesthesia Plan Comments:        Anesthesia Quick Evaluation

## 2020-02-17 NOTE — Plan of Care (Signed)

## 2020-02-17 NOTE — Progress Notes (Signed)
PROGRESS NOTE    Tami Richards  VQQ:[id number] DOB: 03-15-1935 DOA: 02/05/2020 PCP: Charlane Ferretti, DO     Brief Narrative:  85 year old WF PMHx  HOH, DM 2, CKD 4, hypothyroidism, HTN, and HLD   who was brought to the ED via EMS due to mental status changes and severe profound weakness.  According to the family the patient had been sick for approximately 1 week. She was tested 9/3 and found to be positive for Covid.  She received a Regeneron infusion 9/5.  When she presented for the infusion she was found to be hypotensive with systolics in the 80s.  She was treated with a bolus of fluid.  Oxygen saturations at that time varied between 84 to 96% on room air.  After volume resuscitation she had stabilized and was able to be discharged home.  After returning home unfortunately she continued to decline.  She developed low-grade fevers.  When she became confused the family brought her back to the ED.   Subjective: 9/18 A/O x4, negative CP, negative nausea, negative abdominal pain.  States Dr. Berneice Heinrich urology has spoken with her, and now is going to give Kennyth Arnold her daughter a call, concerning her RIGHT hydronephrosis which we discussed this a.m.   Assessment & Plan: Covid vaccination; unvaccinated   Principal Problem:   Acute hypoxemic respiratory failure due to COVID-19 [facility]) Active Problems:   Hypothyroidism   Diabetes mellitus type 2, controlled (HCC)   Essential hypertension   Hip pain, bilateral   Altered mental status   Hemorrhagic shock (HCC)   Retroperitoneal bleed   Coagulopathy (HCC)   Acute respiratory failure with hypoxia (HCC)   Pneumonia due to COVID-19 virus   Acute metabolic encephalopathy   CKD (chronic kidney disease), stage IV (HCC)   Diabetes type 2, uncontrolled (HCC)   HLD (hyperlipidemia)   Hemorrhagic shock/acute RIGHT Retroperitoneal Hematoma -9/10 transfuse 2 units PRBC -9/11 transfuse 2 units PRBC.   -9/11 CT abdomen pelvis shows large  retroperitoneal hematoma see results below. Lab Results  Component Value Date   HGB 10.7 (L) 02/17/2020   HGB 10.6 (L) 02/16/2020   HGB 10.9 (L) 02/15/2020   HGB 11.8 (L) 02/14/2020   HGB 11.2 (L) 02/13/2020  -9/16 patient's H&H dropping will need to rescan abdomen and pelvis for acute bleed. -9/16 PTT/PT/INR pending  Coagulopathy 9/10 patient received her last dose of subcu heparin at approximately 6 AM  -despite this her PTT in the afternoon 9/10 remained quite elevated at approximately 85, given her life-threatening bleeding I dosed her with max dose protamine  -her PTT has corrected and is remaining stable  Acute respiratory failure with hypoxia/Covid pneumonia COVID-19 Labs  Recent Labs    02/15/20 0410 02/16/20 0406 02/17/20 0439  DDIMER 12.52* 11.82* 11.06*  FERRITIN 577* 532* 651*  LDH 258* 310* 339*  CRP 12.7* 16.2* 16.7*    No results found for: SARSCOV2NAA  Received Regeneron 9/5  -has completed a course of Remdesivir  - stopped Baricitinib while patient was intolerant of oral intake - appears clinically stable from a respiratory standpoint presently  -9/16 obtain ambulatory SPO2  Acute metabolic encephalopathy -Appears to have simply been related to acute illness and dehydration initially, and now due to recovery from shock state as well as narcotic pain meds -9/16 resolved  CKD stage IV (baseline CR~2.2)  Lab Results  Component Value Date   CREATININE 1.41 (H) 02/17/2020   CREATININE 1.34 (H) 02/16/2020   CREATININE 1.28 (H) 02/15/2020  CREATININE 1.35 (H) 02/14/2020   CREATININE 1.57 (H) 02/13/2020  -Better than baseline  RIGHT Hydronephrosis -Mild to moderate slightly increased on repeat CT scan 9/16 CT results below.  It was not mentioned on previous CT scan 9/11. -Strict in and out -9/18 multidisciplinary phone conference consisting of Dr. Bradly Chris radiology and Dr. Berneice Heinrich urology both agreed with me that ureter from the right kidney was being  extrinsically compressed by the large right retroperitoneal bleed.  Dr. Berneice Heinrich urology agreed to evaluate patient and determine course of action.  Will await his recommendation.    UTI positive E. coli -9/17 susceptibilities to follow will start empiric ceftriaxone  DM type II uncontrolled -9/8 hemoglobin A1c 8= 7.7   Essential HTN -Not presently an issue  HLD -Lipid panel pending  -Continue atorvastatin when intake improved  Hypothyroidism -Synthroid 50 mcg daily   Hypokalemia- -Potassium goal> 4 -9/18 K-DUR 50 mEq   Hypomagnesmia -Magnesium goal> 2  Hypophosphatemia -Phosphorus goal> 2.5   DVT prophylaxis: SCD Code Status: DNR Family Communication: 9/18 spoke with Misty Stanley (daughter) counseled on plan of care answered all questions Status is: Inpatient    Dispo: The patient is from: Home              Anticipated d/c is to: Home lives with 2 grandsons              Anticipated d/c date is: 9/18              Patient currently unstable      Consultants:  Phone consult Dr. Bradly Chris radiology  Dr. Berneice Heinrich urology   Procedures/Significant Events:  9/11 CT abdomen pelvis W0 contrast;-large retroperitoneal hematoma extending from the mid RIGHT kidney level to the RIGHT inguinal region. Hematoma volume equal 400 cubic cm. High-density hematoma within the RIGHT psoas muscle may be source of hematoma. -.Large nonobstructing calculus in the RIGHT renal pelvis. -Atrophic LEFT kidney. -Gallstone without evidence cholecystitis. 9/16 CT abdomen pelvis W0 contrast;-grossly stable large retroperitoneal hematoma (that appears to involve the right psoas muscle superiorly) extending from the level of the hemidiaphragm just posterior to the liver to the right groin. Right psoas muscle as well as the liver (uncommon but possible source of retroperitoneal hematoma) remain in the differential for etiology of the hematoma. -Stable slightly increased mild to moderate right hydronephrosis  with etiology likely related to the right ureteral course along the retroperitoneal hematoma. -Interval increase in small right pleural effusion. -Interval development of trace left pleural effusion. -Nonobstructive 1.6 cm in punctate right nephrolithiasis. Atrophic left kidney.  -Cholelithiasis. At least moderate volume hiatal hernia. -L1 vertebral body compression fracture with greater than 95% height loss.  -Left lower lobe 4 mm sub solid pulmonary nodule. -Aortic aneurysm NOS (ICD10-I71.9).    I have personally reviewed and interpreted all radiology studies and my findings are as above.  VENTILATOR SETTINGS: Nasal cannula 9/18 Room air SPO2 95%    Cultures 9/3/SARS coronavirus positive 9/16 urine culture positive E. coli   Antimicrobials: Anti-infectives (From admission, onward)   Start     Ordered Stop   02/06/20 1000  remdesivir 100 mg in sodium chloride 0.9 % 100 mL IVPB       "Followed by" Linked Group Details   02/05/20 1536 02/09/20 1028   02/05/20 1600  remdesivir 200 mg in sodium chloride 0.9% 250 mL IVPB       "Followed by" Linked Group Details   02/05/20 1536 02/05/20 2002       Devices  LINES / TUBES:      Continuous Infusions: . cefTRIAXone (ROCEPHIN)  IV 2 g (02/16/20 2252)  . dextrose 5 % and 0.9% NaCl 100 mL/hr at 02/16/20 2252     Objective: Vitals:   02/16/20 1345 02/16/20 1844 02/16/20 2135 02/17/20 0547  BP: (!) 145/68  (!) 124/57 (!) 147/49  Pulse: 84  89 77  Resp: 20  18 20   Temp: 98.1 F (36.7 C)  98.8 F (37.1 C) 97.9 F (36.6 C)  TempSrc: Oral     SpO2: 97% 95% 93% 96%  Weight:      Height:        Intake/Output Summary (Last 24 hours) at 02/17/2020 0913 Last data filed at 02/17/2020 0546 Gross per 24 hour  Intake 2137 ml  Output 1650 ml  Net 487 ml   Filed Weights   02/10/20 2300  Weight: 65.3 kg   Physical Exam:  General: A/O x4, positive minimal acute respiratory distress Eyes: negative scleral hemorrhage,  negative anisocoria, negative icterus ENT: Negative Runny nose, negative gingival bleeding, Neck:  Negative scars, masses, torticollis, lymphadenopathy, JVD Lungs: Clear to auscultation bilaterally without wheezes or crackles Cardiovascular: Regular rate and rhythm without murmur gallop or rub normal S1 and S2 Abdomen: Positive abdominal pain right lateral aspect of abdomen from lower rib down to hip, nondistended, positive soft, bowel sounds, no rebound, no ascites, no appreciable mass Extremities: No significant cyanosis, clubbing, or edema bilateral lower extremities Skin: Negative rashes, lesions, ulcers Psychiatric:  Negative depression, negative anxiety, negative fatigue, negative mania  Central nervous system:  Cranial nerves II through XII intact, tongue/uvula midline, all extremities muscle strength 5/5, sensation intact throughout, negative dysarthria, negative expressive aphasia, negative receptive aphasia.  .     Data Reviewed: Care during the described time interval was provided by me .  I have reviewed this patient's available data, including medical history, events of note, physical examination, and all test results as part of my evaluation.  CBC: Recent Labs  Lab 02/13/20 0619 02/14/20 1227 02/15/20 0410 02/16/20 0406 02/17/20 0439  WBC 15.2* 14.6* 10.9* 9.9 10.9*  NEUTROABS  --  12.6* 8.6* 7.6 8.8*  HGB 11.2* 11.8* 10.9* 10.6* 10.7*  HCT 34.8* 38.4 34.8* 33.6* 33.3*  MCV 91.1 94.1 93.5 91.1 91.7  PLT 224 310 264 295 420*   Basic Metabolic Panel: Recent Labs  Lab 02/13/20 0619 02/14/20 1227 02/15/20 0410 02/16/20 0406 02/17/20 0439  NA 143 139 136 133* 140  K 3.8 3.8 3.3* 4.1 3.3*  CL 110 106 108 104 109  CO2 23 21* 22 22 24   GLUCOSE 45* 200* 186* 168* 185*  BUN 32* 22 18 18 16   CREATININE 1.57* 1.35* 1.28* 1.34* 1.41*  CALCIUM 8.5* 8.8* 8.2* 8.0* 8.5*  MG  --  2.0 1.7 2.3 2.2  PHOS  --  1.6* 1.4* 3.3 2.0*   GFR: Estimated Creatinine Clearance: 28.3  mL/min (A) (by C-G formula based on SCr of 1.41 mg/dL (H)). Liver Function Tests: Recent Labs  Lab 02/12/20 0507 02/14/20 1227 02/15/20 0410 02/16/20 0406 02/17/20 0439  AST 42* 29 20 24 23   ALT 38 35 25 26 24   ALKPHOS 54 64 54 60 68  BILITOT 1.3* 1.8* 1.6* 1.6* 1.5*  PROT 5.8* 5.9* 4.9* 5.3* 5.5*  ALBUMIN 3.0* 2.7* 2.2* 2.3* 2.4*   No results for input(s): LIPASE, AMYLASE in the last 168 hours. No results for input(s): AMMONIA in the last 168 hours. Coagulation Profile: Recent Labs  Lab 02/15/20  1917  INR 1.0   Cardiac Enzymes: No results for input(s): CKTOTAL, CKMB, CKMBINDEX, TROPONINI in the last 168 hours. BNP (last 3 results) Recent Labs    12/12/19 1455  PROBNP 201.0*   HbA1C: No results for input(s): HGBA1C in the last 72 hours. CBG: Recent Labs  Lab 02/16/20 0745 02/16/20 1113 02/16/20 1643 02/16/20 2136 02/17/20 0755  GLUCAP 178* 190* 113* 166* 168*   Lipid Profile: Recent Labs    02/16/20 0406  CHOL 131  HDL 29*  LDLCALC 75  TRIG 161  CHOLHDL 4.5   Thyroid Function Tests: No results for input(s): TSH, T4TOTAL, FREET4, T3FREE, THYROIDAB in the last 72 hours. Anemia Panel: Recent Labs    02/16/20 0406 02/17/20 0439  FERRITIN 532* 651*   Sepsis Labs: No results for input(s): PROCALCITON, LATICACIDVEN in the last 168 hours.  Recent Results (from the past 240 hour(s))  Culture, Urine     Status: Abnormal   Collection Time: 02/15/20  6:33 PM   Specimen: Urine, Clean Catch  Result Value Ref Range Status   Specimen Description   Final    URINE, CLEAN CATCH Performed at [facility], [address] [place], [place] [address]    Special Requests   Final    NONE Performed at [facility], [address], [place], [place] [address]    Culture >=100,000 COLONIES/mL ESCHERICHIA COLI (A)  Final   Report Status 02/17/2020 FINAL  Final   Organism ID, Bacteria ESCHERICHIA COLI (A)  Final       Susceptibility   Escherichia coli - MIC*    AMPICILLIN <=2 SENSITIVE Sensitive     CEFAZOLIN <=4 SENSITIVE Sensitive     CEFTRIAXONE <=0.25 SENSITIVE Sensitive     CIPROFLOXACIN <=0.25 SENSITIVE Sensitive     GENTAMICIN <=1 SENSITIVE Sensitive     IMIPENEM <=0.25 SENSITIVE Sensitive     NITROFURANTOIN <=16 SENSITIVE Sensitive     TRIMETH/SULFA <=20 SENSITIVE Sensitive     AMPICILLIN/SULBACTAM <=2 SENSITIVE Sensitive     PIP/TAZO <=4 SENSITIVE Sensitive     * >=100,000 COLONIES/mL ESCHERICHIA COLI         Radiology Studies: CT ABDOMEN PELVIS WO CONTRAST  Result Date: 02/15/2020 CLINICAL DATA:  Decreasing hemoglobin and hematocrit in a patient with previous retroperitoneal bleed. EXAM: CT ABDOMEN AND PELVIS WITHOUT CONTRAST TECHNIQUE: Multidetector CT imaging of the abdomen and pelvis was performed following the standard protocol without IV contrast. COMPARISON:  CT abdomen pelvis 02/10/2020, 10/27/2006. FINDINGS: Lower chest: Interval increase in a trace to small volume right pleural effusion. Interval development of a trace left pleural effusion. Associated bilateral lower lobe passive atelectasis. Linear atelectasis within the right lower lobe. 4 mm left lower lobe subsolid nodule (2:8). Aortic root calcifications. At least 3 vessel mild coronary artery calcifications. At least moderate volume hiatal hernia. Hepatobiliary: No focal liver abnormality is seen. Calcified gallstones within the gallbladder lumen. No gallbladder wall thickening or para cholecystic fluid. No biliary ductal dilatation. Pancreas: Unremarkable. No pancreatic ductal dilatation or surrounding inflammatory changes. Spleen: Normal in size without focal abnormality. Adrenals/Urinary Tract: No adrenal nodule bilaterally. Redemonstration of a well-defined round 1.6 cm calcified gallstone within the inferior pole of the right kidney. Associated punctate calculi is noted subjacent. Stable to slightly worsened mild to moderate  right hydronephrosis. The right ureter appears to course along the retroperitoneal hematoma is poorly visualized. No ureterolithiasis identified. The left kidney is atrophic. No left hydronephrosis. No contour-deforming renal mass. No ureterolithiasis or  hydroureter. The urinary bladder is unremarkable. Stomach/Bowel: Stomach is within normal limits. Appendix appears normal. No evidence of bowel wall thickening, distention, or inflammatory changes. Scattered sigmoid diverticulosis. Vascular/Lymphatic: Stable ectatic infrarenal abdominal aorta with a caliber of 3.5 cm on axial imaging (3:32). No iliac artery aneurysm. Severe atherosclerotic plaque of the aorta and its branches. No abdominal, pelvic, or inguinal lymphadenopathy. Reproductive: Uterus and bilateral adnexa are unremarkable. Other: Similar-appearing bilobed retroperitoneal hematoma that originates at the level of the hemidiaphragm open (2:22), courses along the right psoas muscle and medial to the right renal space as it travels inferiorly to terminate at the level of right groin open (2:60). Superior-most largest component measuring 7.5 x 7.7 cm on axial imaging (2:39) and inferior-most largest component measuring 10.5 x 5.2 cm on axial imaging (2:53, 5:42). The hematoma appears to involve the right psoas muscle superiorly (2:32-40). A separate the contiguous component of the hematoma is again noted laterally and measures up to 7.6 cm on axial imaging (2:44). Similar appearing area of hyperdensity measuring 3 cm on axial imaging and extending approximately 10 cm in the craniocaudal dimension likely represents clotted blood. Similar area of layering hyperdensity posterior to the liver along the right hemidiaphragm best imaged on sagittal view (6:41). No simple free fluid ascites. No intraperitoneal free gas. No organized fluid collection. Musculoskeletal: Subcutaneous soft tissue edema and stranding along the deep abdominal wall. Diffusely decreased bone  density. Multilevel degenerative changes of the spine. Similar-appearing grade 2 anterolisthesis of L5 on S1. Similar-appearing compression fracture of the L1 vertebral body with greater than 95% height loss. No acute displaced fracture. No suspicious lytic or blastic osseous lesions. IMPRESSION: 1. Grossly stable large retroperitoneal hematoma (that appears to involve the right psoas muscle superiorly) extending from the level of the hemidiaphragm just posterior to the liver to the right groin. Right psoas muscle as well as the liver (uncommon but possible source of retroperitoneal hematoma) remain in the differential for etiology of the hematoma. 2. Stable slightly increased mild to moderate right hydronephrosis with etiology likely related to the right ureteral course along the retroperitoneal hematoma. 3. Interval increase in small right pleural effusion. 4. Interval development of trace left pleural effusion. 5. Other imaging findings of potential clinical significance: Nonobstructive 1.6 cm in punctate right nephrolithiasis. Atrophic left kidney. Cholelithiasis. At least moderate volume hiatal hernia. L1 vertebral body compression fracture with greater than 95% height loss. Left lower lobe 4 mm sub solid pulmonary nodule. 6. Aortic aneurysm NOS (ICD10-I71.9). 7. Aortic Atherosclerosis (ICD10-I70.0). Electronically Signed   By: Tish Frederickson M.D.   On: 02/15/2020 19:12        Scheduled Meds: . insulin aspart  0-9 Units Subcutaneous TID WC  . levothyroxine  50 mcg Oral Q0600  . mouth rinse  15 mL Mouth Rinse BID  . montelukast  10 mg Oral QHS   Continuous Infusions: . cefTRIAXone (ROCEPHIN)  IV 2 g (02/16/20 2252)  . dextrose 5 % and 0.9% NaCl 100 mL/hr at 02/16/20 2252     LOS: 12 days    Time spent:40 min    Rhiannan Kievit, Roselind Messier, MD [facility] Pager [phone]  If 7PM-7AM, please contact night-coverage [email or web address] Password [facility] 02/17/2020, 9:13 AM

## 2020-02-18 ENCOUNTER — Inpatient Hospital Stay (HOSPITAL_COMMUNITY): Payer: Medicare Other | Admitting: Anesthesiology

## 2020-02-18 ENCOUNTER — Encounter (HOSPITAL_COMMUNITY)
Admission: EM | Disposition: A | Discharge status: Home Health | Payer: Self-pay | Source: Home / Self Care | Attending: Internal Medicine

## 2020-02-18 ENCOUNTER — Inpatient Hospital Stay (HOSPITAL_COMMUNITY): Payer: Medicare Other

## 2020-02-18 DIAGNOSIS — U071 COVID-19: Secondary | ICD-10-CM | POA: Diagnosis present

## 2020-02-18 HISTORY — PX: CYSTOSCOPY W/ URETERAL STENT PLACEMENT: SHX1429

## 2020-02-18 LAB — CBC WITH DIFFERENTIAL/PLATELET
Abs Immature Granulocytes: 0.07 10*3/uL (ref 0.00–0.07)
Basophils Absolute: 0 10*3/uL (ref 0.0–0.1)
Basophils Relative: 0 %
Eosinophils Absolute: 0.1 10*3/uL (ref 0.0–0.5)
Eosinophils Relative: 1 %
HCT: 36.2 % (ref 36.0–46.0)
Hemoglobin: 11.3 g/dL — ABNORMAL LOW (ref 12.0–15.0)
Immature Granulocytes: 1 %
Lymphocytes Relative: 10 %
Lymphs Abs: 1 10*3/uL (ref 0.7–4.0)
MCH: 29.4 pg (ref 26.0–34.0)
MCHC: 31.2 g/dL (ref 30.0–36.0)
MCV: 94 fL (ref 80.0–100.0)
Monocytes Absolute: 1.1 10*3/uL — ABNORMAL HIGH (ref 0.1–1.0)
Monocytes Relative: 12 %
Neutro Abs: 7.3 10*3/uL (ref 1.7–7.7)
Neutrophils Relative %: 76 %
Platelets: 412 10*3/uL — ABNORMAL HIGH (ref 150–400)
RBC: 3.85 MIL/uL — ABNORMAL LOW (ref 3.87–5.11)
RDW: 15.5 % (ref 11.5–15.5)
WBC: 9.5 10*3/uL (ref 4.0–10.5)
nRBC: 0 % (ref 0.0–0.2)

## 2020-02-18 LAB — MRSA PCR SCREENING: MRSA by PCR: NEGATIVE

## 2020-02-18 LAB — LACTATE DEHYDROGENASE: LDH: 449 U/L — ABNORMAL HIGH (ref 98–192)

## 2020-02-18 LAB — COMPREHENSIVE METABOLIC PANEL
ALT: 22 U/L (ref 0–44)
AST: 23 U/L (ref 15–41)
Albumin: 2.4 g/dL — ABNORMAL LOW (ref 3.5–5.0)
Alkaline Phosphatase: 69 U/L (ref 38–126)
Anion gap: 7 (ref 5–15)
BUN: 15 mg/dL (ref 8–23)
CO2: 22 mmol/L (ref 22–32)
Calcium: 8.4 mg/dL — ABNORMAL LOW (ref 8.9–10.3)
Chloride: 108 mmol/L (ref 98–111)
Creatinine, Ser: 1.33 mg/dL — ABNORMAL HIGH (ref 0.44–1.00)
GFR calc Af Amer: 43 mL/min — ABNORMAL LOW (ref >60)
GFR calc non Af Amer: 37 mL/min — ABNORMAL LOW (ref >60)
Glucose, Bld: 184 mg/dL — ABNORMAL HIGH (ref 70–99)
Potassium: 4.3 mmol/L (ref 3.5–5.1)
Sodium: 137 mmol/L (ref 135–145)
Total Bilirubin: 1.2 mg/dL (ref 0.3–1.2)
Total Protein: 5.3 g/dL — ABNORMAL LOW (ref 6.5–8.1)

## 2020-02-18 LAB — GLUCOSE, CAPILLARY
Glucose-Capillary: 161 mg/dL — ABNORMAL HIGH (ref 70–99)
Glucose-Capillary: 175 mg/dL — ABNORMAL HIGH (ref 70–99)
Glucose-Capillary: 182 mg/dL — ABNORMAL HIGH (ref 70–99)
Glucose-Capillary: 316 mg/dL — ABNORMAL HIGH (ref 70–99)

## 2020-02-18 LAB — C-REACTIVE PROTEIN: CRP: 16.2 mg/dL — ABNORMAL HIGH (ref <1.0)

## 2020-02-18 LAB — D-DIMER, QUANTITATIVE: D-Dimer, Quant: 10.52 ug/mL-FEU — ABNORMAL HIGH (ref 0.00–0.50)

## 2020-02-18 LAB — MAGNESIUM: Magnesium: 2.1 mg/dL (ref 1.7–2.4)

## 2020-02-18 LAB — PHOSPHORUS: Phosphorus: 1.6 mg/dL — ABNORMAL LOW (ref 2.5–4.6)

## 2020-02-18 LAB — FERRITIN: Ferritin: 679 ng/mL — ABNORMAL HIGH (ref 11–307)

## 2020-02-18 SURGERY — CYSTOSCOPY, WITH RETROGRADE PYELOGRAM AND URETERAL STENT INSERTION
Anesthesia: General | Site: Ureter | Laterality: Right

## 2020-02-18 MED ORDER — MIDAZOLAM HCL 2 MG/2ML IJ SOLN: 0.5000 mg | Freq: Once | INTRAMUSCULAR | Status: DC | PRN

## 2020-02-18 MED ORDER — SODIUM PHOSPHATES 45 MMOLE/15ML IV SOLN
40.0000 mmol | Freq: Once | INTRAVENOUS | Status: AC
  Administered 2020-02-18: 40 mmol via INTRAVENOUS
  Filled 2020-02-18: qty 13.33

## 2020-02-18 MED ORDER — FENTANYL CITRATE (PF) 100 MCG/2ML IJ SOLN
INTRAMUSCULAR | Status: AC
  Filled 2020-02-18: qty 2

## 2020-02-18 MED ORDER — FENTANYL CITRATE (PF) 100 MCG/2ML IJ SOLN
INTRAMUSCULAR | Status: DC | PRN
Start: 2020-02-18 — End: 2020-02-18
  Administered 2020-02-18: 25 ug via INTRAVENOUS

## 2020-02-18 MED ORDER — PROPOFOL 10 MG/ML IV BOLUS
INTRAVENOUS | Status: AC
  Filled 2020-02-18: qty 40

## 2020-02-18 MED ORDER — PROPOFOL 10 MG/ML IV BOLUS
INTRAVENOUS | Status: DC | PRN
  Administered 2020-02-18: 110 mg via INTRAVENOUS

## 2020-02-18 MED ORDER — PHENYLEPHRINE HCL-NACL 10-0.9 MG/250ML-% IV SOLN
INTRAVENOUS | Status: AC
  Filled 2020-02-18: qty 500

## 2020-02-18 MED ORDER — ONDANSETRON HCL 4 MG/2ML IJ SOLN
INTRAMUSCULAR | Status: DC | PRN
  Administered 2020-02-18: 4 mg via INTRAVENOUS

## 2020-02-18 MED ORDER — DEXAMETHASONE SODIUM PHOSPHATE 10 MG/ML IJ SOLN
INTRAMUSCULAR | Status: DC | PRN
  Administered 2020-02-18: 4 mg via INTRAVENOUS

## 2020-02-18 MED ORDER — SUCCINYLCHOLINE CHLORIDE 200 MG/10ML IV SOSY
PREFILLED_SYRINGE | INTRAVENOUS | Status: DC | PRN
  Administered 2020-02-18: 100 mg via INTRAVENOUS

## 2020-02-18 MED ORDER — IOHEXOL 300 MG/ML  SOLN
INTRAMUSCULAR | Status: DC | PRN
  Administered 2020-02-18: 10 mL

## 2020-02-18 MED ORDER — SODIUM CHLORIDE 0.9 % IR SOLN
Status: DC | PRN
  Administered 2020-02-18: 3000 mL

## 2020-02-18 MED ORDER — MEPERIDINE HCL 50 MG/ML IJ SOLN: 6.2500 mg | INTRAMUSCULAR | Status: DC | PRN

## 2020-02-18 MED ORDER — PHENYLEPHRINE 40 MCG/ML (10ML) SYRINGE FOR IV PUSH (FOR BLOOD PRESSURE SUPPORT)
PREFILLED_SYRINGE | INTRAVENOUS | Status: DC | PRN
  Administered 2020-02-18: 80 ug via INTRAVENOUS
  Administered 2020-02-18 (×2): 40 ug via INTRAVENOUS

## 2020-02-18 MED ORDER — OXYCODONE HCL 5 MG/5ML PO SOLN: 5.0000 mg | Freq: Once | ORAL | Status: DC | PRN

## 2020-02-18 MED ORDER — FENTANYL CITRATE (PF) 100 MCG/2ML IJ SOLN: 25.0000 ug | INTRAMUSCULAR | Status: DC | PRN

## 2020-02-18 MED ORDER — LIDOCAINE 2% (20 MG/ML) 5 ML SYRINGE
INTRAMUSCULAR | Status: DC | PRN
  Administered 2020-02-18: 30 mg via INTRAVENOUS

## 2020-02-18 MED ORDER — LACTATED RINGERS IV SOLN: INTRAVENOUS | Status: DC | PRN

## 2020-02-18 MED ORDER — OXYCODONE HCL 5 MG PO TABS: 5.0000 mg | ORAL_TABLET | Freq: Once | ORAL | Status: DC | PRN

## 2020-02-18 MED ORDER — PROMETHAZINE HCL 25 MG/ML IJ SOLN: 6.2500 mg | INTRAMUSCULAR | Status: DC | PRN

## 2020-02-18 SURGICAL SUPPLY — 15 items
BAG URO CATCHER STRL LF (MISCELLANEOUS) ×2 IMPLANT
BASKET ZERO TIP NITINOL 2.4FR (BASKET) IMPLANT
BSKT STON RTRVL ZERO TP 2.4FR (BASKET)
CATH INTERMIT  6FR 70CM (CATHETERS) IMPLANT
CLOTH BEACON ORANGE TIMEOUT ST (SAFETY) ×2 IMPLANT
GLOVE BIOGEL M STRL SZ7.5 (GLOVE) ×2 IMPLANT
GOWN STRL REUS W/TWL LRG LVL3 (GOWN DISPOSABLE) ×2 IMPLANT
GUIDEWIRE ANG ZIPWIRE 038X150 (WIRE) ×2 IMPLANT
GUIDEWIRE STR DUAL SENSOR (WIRE) IMPLANT
KIT TURNOVER KIT A (KITS) IMPLANT
MANIFOLD NEPTUNE II (INSTRUMENTS) ×2 IMPLANT
PACK CYSTO (CUSTOM PROCEDURE TRAY) ×2 IMPLANT
STENT POLARIS 5FRX24 (STENTS) ×1 IMPLANT
TUBING CONNECTING 10 (TUBING) ×2 IMPLANT
TUBING UROLOGY SET (TUBING) IMPLANT

## 2020-02-18 NOTE — Progress Notes (Signed)
Pt has been taken to surgery.

## 2020-02-18 NOTE — Plan of Care (Signed)

## 2020-02-18 NOTE — Progress Notes (Signed)
Pt has arrived back to Room 1537 from PACU. Alert and oriented. Has voided X one.

## 2020-02-18 NOTE — Progress Notes (Signed)
Day of Surgery   Subjective/Chief Complaint:   1 - Right Hydronephrosis 2/2 Retroperitoneal Hematoma- moderate right hydro by CT 9/16 on restaging of retroperitoneal hematoma that developes while anticoagulated for COVID treatment. Recived 4u blood and Hgb subsequently stabilzed. hematoam stable 9/16 compared to 9/11.  Rt kidney dominant. Ipsilateral non-osbtructing stone.  2 - Stage 4 renal insuficency / Left Renal Atrophy - Cr 1.5-2 at baseline. CT 01/2020 with non-osbtructing Rt renal pelvis stone and left renal atrohpy. Cr no 1.4.   3 - Large Right Renal Stone - 1.6cm incidental Rt renal pelvis stone non-osbtructing on CT 2021. Severel prior episodes managed medically.  4 - Bacteruria - pan-sensitive e.coli by Dorma Russell 9/16. Placed on Rocephin.   Today "Shalaina" is seen to proceed with RIGHT ureteral stent placement. NO high grade fevers. Cr up again at 1.4.  Objective: Vital signs in last 24 hours: Temp:  [98.1 F (36.7 C)-99.1 F (37.3 C)] 99.1 F (37.3 C) (09/19 0420) Pulse Rate:  [78-84] 78 (09/19 0420) Resp:  [17-20] 20 (09/19 0420) BP: (122-140)/(65-80) 136/80 (09/19 0420) SpO2:  [93 %-97 %] 97 % (09/19 0420) Last BM Date: 02/13/20  Intake/Output from previous day: 09/18 0701 - 09/19 0700 In: 2766.5 [P.O.:1200; I.V.:1466.5; IV Piggyback:100] Out: 1500 [Urine:1500] Intake/Output this shift: No intake/output data recorded.  Physical Exam Vitals reviewed.  Constitutional:      Comments: Very mentally astute for age.  HENT:     Nose: Nose normal.  Eyes:     Pupils: Pupils are equal, round, and reactive to light.  Cardiovascular:     Rate and Rhythm: Normal rate.  Pulmonary:     Effort: Pulmonary effort is normal.  Abdominal:     General: Abdomen is flat.  Genitourinary:    Comments: Mild Rt CVAT at present.  Musculoskeletal:        General: Normal range of motion.     Cervical back: Normal range of motion.  Skin:    General: Skin is warm.  Neurological:      General: No focal deficit present.     Mental Status: She is alert.  Psychiatric:        Mood and Affect: Mood normal.     Lab Results:  Recent Labs    02/16/20 0406 02/17/20 0439  WBC 9.9 10.9*  HGB 10.6* 10.7*  HCT 33.6* 33.3*  PLT 295 420*   BMET Recent Labs    02/16/20 0406 02/17/20 0439  NA 133* 140  K 4.1 3.3*  CL 104 109  CO2 22 24  GLUCOSE 168* 185*  BUN 18 16  CREATININE 1.34* 1.41*  CALCIUM 8.0* 8.5*   PT/INR Recent Labs    02/15/20 1917  LABPROT 12.6  INR 1.0   ABG No results for input(s): PHART, HCO3 in the last 72 hours.  Invalid input(s): PCO2, PO2  Studies/Results: No results found.  Anti-infectives: Anti-infectives (From admission, onward)   Start     Dose/Rate Route Frequency Ordered Stop   02/16/20 2200  cefTRIAXone (ROCEPHIN) 2 g in sodium chloride 0.9 % 100 mL IVPB        2 g 200 mL/hr over 30 Minutes Intravenous Every 24 hours 02/16/20 2105     02/06/20 1000  remdesivir 100 mg in sodium chloride 0.9 % 100 mL IVPB       "Followed by" Linked Group Details   100 mg 200 mL/hr over 30 Minutes Intravenous Daily 02/05/20 1536 02/09/20 1028   02/05/20 1600  remdesivir 200 mg  in sodium chloride 0.9% 250 mL IVPB       "Followed by" Linked Group Details   200 mg 580 mL/hr over 30 Minutes Intravenous Once 02/05/20 1536 02/05/20 2002      Assessment/Plan:  1 - Right Hydronephrosis 2/2 Retroperitoneal Hematoma- proceed as planned with RIGHT ureteral stent placement fo rrenal decompression of dominant kidney. Risks, benefits, alternatives, exepcted peri-op course discussed previously and reiterated today.   2 - Stage 4 renal insuficency / Left Renal Atrophy - plan as per above, stenting to preserve GFR of dominant kdiney.  3 - Large Right Renal Stone - plan as per above with goal of stone free via staged approach.  4 - Bacteruria - Agree with rocephing. Plan as per above to reduce risk of progression to obstrcting pyelo.     [name] 02/18/2020

## 2020-02-18 NOTE — Op Note (Signed)
NAMEMANAAL, MANDALA MEDICAL RECORD [id number] ACCOUNT [id number] DATE OF BIRTH:[birth year] FACILITY: [facility] LOCATION: [id number] PHYSICIAN:Trenity Pha Tresa Moore, MD  OPERATIVE REPORT  DATE OF PROCEDURE:  02/18/2020  SURGEON:  [name], MD  PREOPERATIVE DIAGNOSIS:  Right retroperitoneal hematoma with renal obstruction, dominant kidney.  POSTOPERATIVE DIAGNOSIS:   Right retroperitoneal hematoma with renal obstruction, dominant kidney.  PROCEDURE: 1.  Cystoscopy, right retrograde pyelogram, interpretation. 2.  Right ureteral stent placement, 5 x 24 Polaris, no tether.  ESTIMATED BLOOD LOSS:  Nil.  COMPLICATIONS:  None.  SPECIMENS:  None.  FINDINGS: 1.  Moderate right hydronephrosis and a large right lower pole renal stone. 2.  Successful placement of right ureteral stent, proximal end in renal pelvis, distal end in urinary bladder.  INDICATIONS:  The patient is a pleasant 85 year old woman with recent history of COVID infection that was fairly severe.  She has had a prolonged hospitalization for this.  Fortunately, she has made a remarkable recovery from her viral syndrome.  In the  midst of treatment of this, she was appropriately anticoagulated, but however, developed a retroperitoneal hematoma that was hemodynamically significant requiring transfusion.  The  hematoma subsequently stabilized hemodynamically, however, then resulted  in some right hydronephrosis.  She does have a right dominant kidney with significant left renal atrophy and a significant large right renal stone.  Her renal function is deteriorating somewhat.  Options were discussed for management, including  recommended path of ureteral stenting as a temporizing measure to allow preservation of her right renal function as the hematoma resolves and set her up for a staged approach for addressing her right renal stone at this is a threat to her dominant kidney  as well in the future.  The patient and family  wished to proceed.  Informed consent was obtained and placed in the medical record.  DESCRIPTION OF PROCEDURE:  The patient being identified, the procedure being right ureteral stent placement was confirmed.  Procedure timeout was performed.  Intravenous antibiotics were administered.  General anesthesia introduced under COVID  precautions with minimal staff.  The patient was then placed into a low lithotomy position.  Sterile field was created, prepping and draping the patient's vagina, introitus and proximal thighs using iodine.  Cystourethroscopy was performed using  21-French rigid cystoscope with offset lens.  Inspection of urinary bladder revealed no diverticula, calcifications, or papillary lesions.  The right ureteral orifice was cannulated with a 6-French renal catheter and right retrograde pyelogram was  obtained.  Right retrograde pyelogram demonstrated a single right ureter with single system right kidney.  There was some mild tortuosity of the right ureter with progressive hydronephrosis, thickening moderate towards the area of the kidney, consistent with  extrinsic compression from the hematoma.  A 0.03 ZIPwire was advanced to the lower pole and a new 5 x 24 Polaris-type stent was placed using cystoscopic and fluoroscopic guidance.  Good proximal and distal planes were noted.  Bladder was empty per  cystoscope.  The procedure was then terminated.  The patient tolerated the procedure well.  There were no immediate perioperative complications.  The patient was taken to postanesthesia care unit in stable condition.  Plan for recovery under COVID precautions and then return to unit.  VN/NUANCE  D:02/18/2020 T:02/18/2020 JOB:[id number]

## 2020-02-18 NOTE — Brief Op Note (Signed)
02/05/2020 - 02/18/2020  8:31 AM  PATIENT:  Tami Richards  85 y.o. female  PRE-OPERATIVE DIAGNOSIS:  hydronephrosis  POST-OPERATIVE DIAGNOSIS:  * No post-op diagnosis entered *  PROCEDURE:  Procedure(s): CYSTOSCOPY WITH RETROGRADE PYELOGRAM/URETERAL STENT PLACEMENT (Right)  SURGEON:  Surgeon(s) and Role:    * [name], MD - Primary  PHYSICIAN ASSISTANT:   ASSISTANTS: none   ANESTHESIA:   general  EBL:  minimal   BLOOD ADMINISTERED:none  DRAINS: none   LOCAL MEDICATIONS USED:  NONE  SPECIMEN:  No Specimen  DISPOSITION OF SPECIMEN:  N/A  COUNTS:  YES  TOURNIQUET:  * No tourniquets in log *  DICTATION: .Other Dictation: Dictation Number [id number]  PLAN OF CARE: Admit to inpatient   PATIENT DISPOSITION:  PACU - hemodynamically stable.   Delay start of Pharmacological VTE agent (>24hrs) due to surgical blood loss or risk of bleeding: not applicable

## 2020-02-18 NOTE — Progress Notes (Addendum)
PROGRESS NOTE    Tami Richards  FAO:[id number] DOB: [birth year] DOA: 02/05/2020 PCP: Charlane Ferretti, DO     Brief Narrative:  85 year old WF PMHx  HOH, DM 2, CKD 4, hypothyroidism, HTN, and HLD   who was brought to the ED via EMS due to mental status changes and severe profound weakness.  According to the family the patient had been sick for approximately 1 week. She was tested 9/3 and found to be positive for Covid.  She received a Regeneron infusion 9/5.  When she presented for the infusion she was found to be hypotensive with systolics in the 80s.  She was treated with a bolus of fluid.  Oxygen saturations at that time varied between 84 to 96% on room air.  After volume resuscitation she had stabilized and was able to be discharged home.  After returning home unfortunately she continued to decline.  She developed low-grade fevers.  When she became confused the family brought her back to the ED.   Subjective: 9/19 afebrile overnight Dr. Berneice Heinrich urology plans on placing RIGHT ureteral stent today.     Assessment & Plan: Covid vaccination; unvaccinated   Principal Problem:   Acute hypoxemic respiratory failure due to COVID-19 [facility]) Active Problems:   Hypothyroidism   Diabetes mellitus type 2, controlled (HCC)   Essential hypertension   Hip pain, bilateral   Altered mental status   Hemorrhagic shock (HCC)   Retroperitoneal bleed   Coagulopathy (HCC)   Acute respiratory failure with hypoxia (HCC)   Pneumonia due to COVID-19 virus   Acute metabolic encephalopathy   CKD (chronic kidney disease), stage IV (HCC)   Diabetes type 2, uncontrolled (HCC)   HLD (hyperlipidemia)   Hemorrhagic shock/acute RIGHT Retroperitoneal Hematoma -9/10 transfuse 2 units PRBC -9/11 transfuse 2 units PRBC.   -9/11 CT abdomen pelvis shows large retroperitoneal hematoma see results below. Lab Results  Component Value Date   HGB 11.3 (L) 02/18/2020   HGB 10.7 (L) 02/17/2020   HGB 10.6 (L)  02/16/2020   HGB 10.9 (L) 02/15/2020   HGB 11.8 (L) 02/14/2020  -9/16 patient's H&H dropping will need to rescan abdomen and pelvis for acute bleed. -9/16 PTT/PT/INR pending  Coagulopathy 9/10 patient received her last dose of subcu heparin at approximately 6 AM  -despite this her PTT in the afternoon 9/10 remained quite elevated at approximately 85, given her life-threatening bleeding I dosed her with max dose protamine  -her PTT has corrected and is remaining stable  Acute respiratory failure with hypoxia/Covid pneumonia COVID-19 Labs  Recent Labs    02/16/20 0406 02/17/20 0439 02/18/20 0647  DDIMER 11.82* 11.06* 10.52*  FERRITIN 532* 651*  --   LDH 310* 339* 449*  CRP 16.2* 16.7*  --     No results found for: SARSCOV2NAA  Received Regeneron 9/5  -has completed a course of Remdesivir  - stopped Baricitinib while patient was intolerant of oral intake - appears clinically stable from a respiratory standpoint presently  -9/16 Ambulatory SPO2; patient does not meet requirements for home O2  Acute metabolic encephalopathy -Appears to have simply been related to acute illness and dehydration initially, and now due to recovery from shock state as well as narcotic pain meds -9/16 resolved  CKD stage IV (baseline CR~2.2)  Lab Results  Component Value Date   CREATININE 1.33 (H) 02/18/2020   CREATININE 1.41 (H) 02/17/2020   CREATININE 1.34 (H) 02/16/2020   CREATININE 1.28 (H) 02/15/2020   CREATININE 1.35 (H) 02/14/2020  -Better than  baseline  RIGHT Hydronephrosis -Mild to moderate slightly increased on repeat CT scan 9/16 CT results below.  It was not mentioned on previous CT scan 9/11. -Strict in and out -9/18 multidisciplinary phone conference consisting of Dr. Bradly Chris radiology and Dr. Berneice Heinrich urology both agreed with me that ureter from the right kidney was being extrinsically compressed by the large right retroperitoneal bleed.  Dr. Berneice Heinrich urology agreed to evaluate  patient and determine course of action.   -9/19 s/p CYSTOSCOPY WITH RETROGRADE PYELOGRAM/URETERAL STENT PLACEMENT (Right).  Per Dr. Berneice Heinrich urology watch overnight and if no issues may discharge in the morning  UTI positive E. coli pansensitive -9/17 susceptibilities to follow will start empiric ceftriaxone  DM type II uncontrolled -9/8 hemoglobin A1c 8= 7.7   Essential HTN -Not presently an issue  HLD -9/17 LDL = 75   -On discharge restart Lipitor  Hypothyroidism -Synthroid 50 mcg daily   Hypokalemia- -Potassium goal> 4  Hypomagnesmia -Magnesium goal> 2  Hypophosphatemia -Phosphorus goal> 2.5 -Sodium phosphate 40 mmol    DVT prophylaxis: SCD Code Status: DNR Family Communication: 9/19 spoke with Misty Stanley (daughter) counseled on plan of care answered all questions Status is: Inpatient    Dispo: The patient is from: Home              Anticipated d/c is to: Home lives with 2 grandsons              Anticipated d/c date is: 9/18              Patient currently unstable      Consultants:  Phone consult Dr. Bradly Chris radiology  Dr. Berneice Heinrich urology   Procedures/Significant Events:  9/11 CT abdomen pelvis W0 contrast;-large retroperitoneal hematoma extending from the mid RIGHT kidney level to the RIGHT inguinal region. Hematoma volume equal 400 cubic cm. High-density hematoma within the RIGHT psoas muscle may be source of hematoma. -.Large nonobstructing calculus in the RIGHT renal pelvis. -Atrophic LEFT kidney. -Gallstone without evidence cholecystitis. 9/16 CT abdomen pelvis W0 contrast;-grossly stable large retroperitoneal hematoma (that appears to involve the right psoas muscle superiorly) extending from the level of the hemidiaphragm just posterior to the liver to the right groin. Right psoas muscle as well as the liver (uncommon but possible source of retroperitoneal hematoma) remain in the differential for etiology of the hematoma. -Stable slightly increased mild to  moderate right hydronephrosis with etiology likely related to the right ureteral course along the retroperitoneal hematoma. -Interval increase in small right pleural effusion. -Interval development of trace left pleural effusion. -Nonobstructive 1.6 cm in punctate right nephrolithiasis. Atrophic left kidney.  -Cholelithiasis. At least moderate volume hiatal hernia. -L1 vertebral body compression fracture with greater than 95% height loss.  -Left lower lobe 4 mm sub solid pulmonary nodule. -Aortic aneurysm NOS (ICD10-I71.9). -9/19 s/p CYSTOSCOPY WITH RETROGRADE PYELOGRAM/URETERAL STENT PLACEMENT (Right).    I have personally reviewed and interpreted all radiology studies and my findings are as above.  VENTILATOR SETTINGS: Nasal cannula 9/19 Room air SPO2 95%    Cultures 9/3/SARS coronavirus positive 9/16 urine culture positive E. coli pansensitive   Antimicrobials: Anti-infectives (From admission, onward)   Start     Ordered Stop   02/06/20 1000  remdesivir 100 mg in sodium chloride 0.9 % 100 mL IVPB       "Followed by" Linked Group Details   02/05/20 1536 02/09/20 1028   02/05/20 1600  remdesivir 200 mg in sodium chloride 0.9% 250 mL IVPB       "  Followed by" Linked Group Details   02/05/20 1536 02/05/20 2002       Devices    LINES / TUBES:      Continuous Infusions: . [MAR Hold] cefTRIAXone (ROCEPHIN)  IV Stopped (02/17/20 2111)  . dextrose 5 % and 0.9% NaCl 100 mL/hr at 02/18/20 0600  . phenylephrine       Objective: Vitals:   02/17/20 1424 02/17/20 2000 02/17/20 2300 02/18/20 0420  BP: 122/65 140/80  136/80  Pulse: 84 83  78  Resp: 17 18  20   Temp: 98.1 F (36.7 C) 98.7 F (37.1 C)  99.1 F (37.3 C)  TempSrc:      SpO2: 95% 93% 95% 97%  Weight:      Height:        Intake/Output Summary (Last 24 hours) at 02/18/2020 0810 Last data filed at 02/18/2020 0600 Gross per 24 hour  Intake 2766.47 ml  Output 1500 ml  Net 1266.47 ml   Filed Weights    02/10/20 2300  Weight: 65.3 kg   Physical Exam:  General: A/O x4, positive minimal acute respiratory distress Eyes: negative scleral hemorrhage, negative anisocoria, negative icterus ENT: Negative Runny nose, negative gingival bleeding, Neck:  Negative scars, masses, torticollis, lymphadenopathy, JVD Lungs: Clear to auscultation bilaterally without wheezes or crackles Cardiovascular: Regular rate and rhythm without murmur gallop or rub normal S1 and S2 Abdomen: Positive abdominal pain right lateral aspect of abdomen from lower rib down to hip, nondistended, positive soft, bowel sounds, no rebound, no ascites, no appreciable mass Extremities: No significant cyanosis, clubbing, or edema bilateral lower extremities Skin: Negative rashes, lesions, ulcers Psychiatric:  Negative depression, negative anxiety, negative fatigue, negative mania  Central nervous system:  Cranial nerves II through XII intact, tongue/uvula midline, all extremities muscle strength 5/5, sensation intact throughout, negative dysarthria, negative expressive aphasia, negative receptive aphasia.  .     Data Reviewed: Care during the described time interval was provided by me .  I have reviewed this patient's available data, including medical history, events of note, physical examination, and all test results as part of my evaluation.  CBC: Recent Labs  Lab 02/14/20 1227 02/15/20 0410 02/16/20 0406 02/17/20 0439 02/18/20 0647  WBC 14.6* 10.9* 9.9 10.9* 9.5  NEUTROABS 12.6* 8.6* 7.6 8.8* 7.3  HGB 11.8* 10.9* 10.6* 10.7* 11.3*  HCT 38.4 34.8* 33.6* 33.3* 36.2  MCV 94.1 93.5 91.1 91.7 94.0  PLT 310 264 295 420* 412*   Basic Metabolic Panel: Recent Labs  Lab 02/14/20 1227 02/15/20 0410 02/16/20 0406 02/17/20 0439 02/18/20 0647  NA 139 136 133* 140 137  K 3.8 3.3* 4.1 3.3* 4.3  CL 106 108 104 109 108  CO2 21* 22 22 24 22   GLUCOSE 200* 186* 168* 185* 184*  BUN 22 18 18 16 15   CREATININE 1.35* 1.28* 1.34*  1.41* 1.33*  CALCIUM 8.8* 8.2* 8.0* 8.5* 8.4*  MG 2.0 1.7 2.3 2.2 2.1  PHOS 1.6* 1.4* 3.3 2.0* 1.6*   GFR: Estimated Creatinine Clearance: 30 mL/min (A) (by C-G formula based on SCr of 1.33 mg/dL (H)). Liver Function Tests: Recent Labs  Lab 02/14/20 1227 02/15/20 0410 02/16/20 0406 02/17/20 0439 02/18/20 0647  AST 29 20 24 23 23   ALT 35 25 26 24 22   ALKPHOS 64 54 60 68 69  BILITOT 1.8* 1.6* 1.6* 1.5* 1.2  PROT 5.9* 4.9* 5.3* 5.5* 5.3*  ALBUMIN 2.7* 2.2* 2.3* 2.4* 2.4*   No results for input(s): LIPASE, AMYLASE in the last 168  hours. No results for input(s): AMMONIA in the last 168 hours. Coagulation Profile: Recent Labs  Lab 02/15/20 1917  INR 1.0   Cardiac Enzymes: No results for input(s): CKTOTAL, CKMB, CKMBINDEX, TROPONINI in the last 168 hours. BNP (last 3 results) Recent Labs    12/12/19 1455  PROBNP 201.0*   HbA1C: No results for input(s): HGBA1C in the last 72 hours. CBG: Recent Labs  Lab 02/17/20 1624 02/17/20 2001 02/17/20 2350 02/18/20 0421 02/18/20 0720  GLUCAP 79 116* 156* 161* 175*   Lipid Profile: Recent Labs    02/16/20 0406  CHOL 131  HDL 29*  LDLCALC 75  TRIG 161  CHOLHDL 4.5   Thyroid Function Tests: No results for input(s): TSH, T4TOTAL, FREET4, T3FREE, THYROIDAB in the last 72 hours. Anemia Panel: Recent Labs    02/16/20 0406 02/17/20 0439  FERRITIN 532* 651*   Sepsis Labs: No results for input(s): PROCALCITON, LATICACIDVEN in the last 168 hours.  Recent Results (from the past 240 hour(s))  Culture, Urine     Status: Abnormal   Collection Time: 02/15/20  6:33 PM   Specimen: Urine, Clean Catch  Result Value Ref Range Status   Specimen Description   Final    URINE, CLEAN CATCH Performed at [facility], [address], [place], [place] [address]    Special Requests   Final    NONE Performed at [facility], [address] [place], [place] [address]    Culture >=100,000  COLONIES/mL ESCHERICHIA COLI (A)  Final   Report Status 02/17/2020 FINAL  Final   Organism ID, Bacteria ESCHERICHIA COLI (A)  Final      Susceptibility   Escherichia coli - MIC*    AMPICILLIN <=2 SENSITIVE Sensitive     CEFAZOLIN <=4 SENSITIVE Sensitive     CEFTRIAXONE <=0.25 SENSITIVE Sensitive     CIPROFLOXACIN <=0.25 SENSITIVE Sensitive     GENTAMICIN <=1 SENSITIVE Sensitive     IMIPENEM <=0.25 SENSITIVE Sensitive     NITROFURANTOIN <=16 SENSITIVE Sensitive     TRIMETH/SULFA <=20 SENSITIVE Sensitive     AMPICILLIN/SULBACTAM <=2 SENSITIVE Sensitive     PIP/TAZO <=4 SENSITIVE Sensitive     * >=100,000 COLONIES/mL ESCHERICHIA COLI  MRSA PCR Screening     Status: None   Collection Time: 02/18/20  3:06 AM   Specimen: Nasal Mucosa; Nasopharyngeal  Result Value Ref Range Status   MRSA by PCR NEGATIVE NEGATIVE Final    Comment:        The GeneXpert MRSA Assay (FDA approved for NASAL specimens only), is one component of a comprehensive MRSA colonization surveillance program. It is not intended to diagnose MRSA infection nor to guide or monitor treatment for MRSA infections. Performed at [facility], [address] [place], [place] [address]          Radiology Studies: No results found.      Scheduled Meds: . [MAR Hold] insulin aspart  0-9 Units Subcutaneous TID WC  . [MAR Hold] levothyroxine  50 mcg Oral Q0600  . [MAR Hold] mouth rinse  15 mL Mouth Rinse BID  . [MAR Hold] montelukast  10 mg Oral QHS   Continuous Infusions: . [MAR Hold] cefTRIAXone (ROCEPHIN)  IV Stopped (02/17/20 2111)  . dextrose 5 % and 0.9% NaCl 100 mL/hr at 02/18/20 0600  . phenylephrine       LOS: 13 days    Time spent:40 min    Jakerra Floyd, Roselind Messier, MD [facility] Pager [phone]  If 7PM-7AM, please contact night-coverage [email or web address] Password [email or web address] 02/18/2020, 8:10 AM

## 2020-02-18 NOTE — Transfer of Care (Signed)
Immediate Anesthesia Transfer of Care Note  Patient: Tami Richards  Procedure(s) Performed: Procedure(s): CYSTOSCOPY WITH RETROGRADE PYELOGRAM/URETERAL STENT PLACEMENT (Right)  Patient Location: PACU  Anesthesia Type:General  Level of Consciousness:  sedated, patient cooperative and responds to stimulation  Airway & Oxygen Therapy:Patient Spontanous Breathing and Patient connected to face mask oxgen  Post-op Assessment:  Report given to PACU RN and Post -op Vital signs reviewed and stable  Post vital signs:  Reviewed and stable  Last Vitals:  Vitals:   02/18/20 0845 02/18/20 0855  BP: 131/61 (!) 146/59  Pulse:  71  Resp: 17 17  Temp: 36.7 C   SpO2: 707% 615%    Complications: No apparent anesthesia complications

## 2020-02-18 NOTE — Anesthesia Postprocedure Evaluation (Signed)
Anesthesia Post Note  Patient: Armed forces logistics/support/administrative officer  Procedure(s) Performed: CYSTOSCOPY WITH RETROGRADE PYELOGRAM/URETERAL STENT PLACEMENT (Right Ureter)     Patient location during evaluation: PACU Anesthesia Type: General Level of consciousness: awake and alert, oriented and patient cooperative Pain management: pain level controlled Vital Signs Assessment: post-procedure vital signs reviewed and stable Respiratory status: spontaneous breathing, nonlabored ventilation and respiratory function stable Cardiovascular status: blood pressure returned to baseline and stable Postop Assessment: no apparent nausea or vomiting Anesthetic complications: no   No complications documented.  Last Vitals:  Vitals:   02/18/20 0845 02/18/20 0855  BP: 131/61 (!) 146/59  Pulse:  71  Resp: 17 17  Temp: 36.7 C   SpO2: 100% 100%    Last Pain:  Vitals:   02/18/20 0845  TempSrc:   PainSc: 0-No pain                 Ivy Meriwether,E. Jerrie Gullo

## 2020-02-18 NOTE — Anesthesia Procedure Notes (Signed)
Procedure Name: Intubation Date/Time: 02/18/2020 8:30 AM Performed by: Lavina Hamman, CRNA Pre-anesthesia Checklist: Patient identified, Emergency Drugs available, Suction available, Patient being monitored and Timeout performed Patient Re-evaluated:Patient Re-evaluated prior to induction Oxygen Delivery Method: Circle system utilized Preoxygenation: Pre-oxygenation with 100% oxygen Induction Type: IV induction Ventilation: Mask ventilation without difficulty Laryngoscope Size: Mac and 3 Grade View: Grade I Tube type: Oral Tube size: 7.5 mm Number of attempts: 1 Airway Equipment and Method: Stylet Placement Confirmation: ETT inserted through vocal cords under direct vision,  positive ETCO2,  CO2 detector and breath sounds checked- equal and bilateral Secured at: 21 cm Tube secured with: Tape Dental Injury: Teeth and Oropharynx as per pre-operative assessment

## 2020-02-19 ENCOUNTER — Encounter (HOSPITAL_COMMUNITY): Payer: Self-pay | Admitting: Urology

## 2020-02-19 LAB — GLUCOSE, CAPILLARY
Glucose-Capillary: 229 mg/dL — ABNORMAL HIGH (ref 70–99)
Glucose-Capillary: 205 mg/dL — ABNORMAL HIGH (ref 70–99)

## 2020-02-19 LAB — PHOSPHORUS: Phosphorus: 3.1 mg/dL (ref 2.5–4.6)

## 2020-02-19 LAB — C-REACTIVE PROTEIN: CRP: 11.6 mg/dL — ABNORMAL HIGH (ref <1.0)

## 2020-02-19 LAB — LACTATE DEHYDROGENASE: LDH: 397 U/L — ABNORMAL HIGH (ref 98–192)

## 2020-02-19 LAB — MAGNESIUM: Magnesium: 2 mg/dL (ref 1.7–2.4)

## 2020-02-19 LAB — D-DIMER, QUANTITATIVE: D-Dimer, Quant: 8.79 ug/mL-FEU — ABNORMAL HIGH (ref 0.00–0.50)

## 2020-02-19 LAB — FERRITIN: Ferritin: 680 ng/mL — ABNORMAL HIGH (ref 11–307)

## 2020-02-19 MED ORDER — IPRATROPIUM-ALBUTEROL 20-100 MCG/ACT IN AERS: 1.0000 | INHALATION_SPRAY | Freq: Four times a day (QID) | RESPIRATORY_TRACT | 0 refills | Status: DC | PRN

## 2020-02-19 MED ORDER — AMOXICILLIN-POT CLAVULANATE 875-125 MG PO TABS: 1.0000 | ORAL_TABLET | Freq: Two times a day (BID) | ORAL | 0 refills | Status: DC

## 2020-02-19 MED ORDER — AMOXICILLIN-POT CLAVULANATE 875-125 MG PO TABS
1.0000 | ORAL_TABLET | Freq: Two times a day (BID) | ORAL | Status: DC
  Administered 2020-02-19: 1 via ORAL
  Filled 2020-02-19: qty 1

## 2020-02-19 NOTE — Discharge Summary (Signed)
Physician Discharge Summary  Tami Richards ZOX:[id number] DOB: Mar 11, 1935 DOA: 02/05/2020  PCP: Sueanne Margarita, DO  Admit date: 02/05/2020 Discharge date: 02/19/2020  Time spent: 35 minutes  Recommendations for Outpatient Follow-up:  Covid vaccination; unvaccinated  Hemorrhagic shock/acute RIGHT Retroperitoneal Hematoma -9/10 transfuse 2 units PRBC -9/11 transfuse 2 units PRBC.   -9/11 CT abdomen pelvis shows large retroperitoneal hematoma see results below. Lab Results  Component Value Date   HGB 11.3 (L) 02/18/2020   HGB 10.7 (L) 02/17/2020   HGB 10.6 (L) 02/16/2020   HGB 10.9 (L) 02/15/2020   HGB 11.8 (L) 02/14/2020   -9/16 patient's H&H dropping will need to rescan abdomen and pelvis for acute bleed. -9/16 PTT/PT/INR pending  Coagulopathy 9/10 patient received her last dose of subcu heparin at approximately 6 AM  -despite this her PTT in the afternoon 9/10 remained quite elevated at approximately 85, given her life-threatening bleeding I dosed her with max dose protamine  -her PTT has corrected and is remaining stable  Acute respiratory failure with hypoxia/Covid pneumonia COVID-19 Labs  Recent Labs    02/17/20 0439 02/18/20 0647 02/19/20 0537  DDIMER 11.06* 10.52* 8.79*  FERRITIN 651* 679* 680*  LDH 339* 449* 397*  CRP 16.7* 16.2* 11.6*    9/3 SARS coronavirus positive  Received Regeneron 9/5  -has completed a course of Remdesivir  - stopped Baricitinibwhile patient was intolerant of oral intake - appears clinically stable from a respiratory standpoint presently -9/16 Ambulatory SPO2; patient does not meet requirements for home O2 -Patient will be considered contagious until 02/23/2020 and should take the below precautions to protect yourself and family  Acute metabolic encephalopathy -Appears to have simply been related to acute illness and dehydration initially, and nowdue to recovery from shock state as well as narcotic pain meds -9/16  resolved  CKD stage IV (baseline CR~2.2) Lab Results  Component Value Date   CREATININE 1.33 (H) 02/18/2020   CREATININE 1.41 (H) 02/17/2020   CREATININE 1.34 (H) 02/16/2020   CREATININE 1.28 (H) 02/15/2020   CREATININE 1.35 (H) 02/14/2020  -Creatinine better than baseline.  RIGHT Hydronephrosis -Mild to moderate slightly increased on repeat CT scan 9/16 CT results below.  It was not mentioned on previous CT scan 9/11. -Strict in and out -9/18 multidisciplinary phone conference consisting of Dr. Clovis Riley radiology and Dr. Tresa Moore urology both agreed with me that ureter from the right kidney was being extrinsically compressed by the large right retroperitoneal bleed.  Dr. Tresa Moore urology agreed to evaluate patient and determine course of action.   -9/19 s/p CYSTOSCOPY WITH RETROGRADE PYELOGRAM/URETERAL STENT PLACEMENT (Right).  Per Dr. Tresa Moore urology watch overnight and if no issues may discharge in the morning -Schedule follow-up with Dr. [name] [facility] in 30 days RIGHT hydronephrosis s/p stent placement  UTI positive E. coli pansensitive -9/17 susceptibilities to follow will start empiric ceftriaxone -9/20 Augmentin to complete 81-XBJ course of complicated UTI  DM type II uncontrolled -9/8 hemoglobin A1c 8= 7.7   Essential HTN -Not presently an issue  HLD -9/17 LDL = 75   -On discharge restart Lipitor  Hypothyroidism -Synthroid 50 mcg daily   Hypokalemia- -Potassium goal> 4  Hypomagnesmia -Magnesium goal> 2  Hypophosphatemia -Phosphorus goal> 2.5 -Sodium phosphate 40 mmol   Discharge Diagnoses:  Principal Problem:   Acute hypoxemic respiratory failure due to COVID-19 [facility]) Active Problems:   Hypothyroidism   Diabetes mellitus type 2, controlled ([place])   Essential hypertension   Hip pain, bilateral   Altered mental status  Hemorrhagic shock (HCC)   Retroperitoneal bleed   Coagulopathy (HCC)   Acute respiratory failure with hypoxia (HCC)    Pneumonia due to COVID-19 virus   Acute metabolic encephalopathy   CKD (chronic kidney disease), stage IV (HCC)   Diabetes type 2, uncontrolled ([place])   HLD (hyperlipidemia)   COVID-19   Discharge Condition: Stable  Diet recommendation: Regular  Filed Weights   02/10/20 2300  Weight: 65.3 kg    History of present illness:  85 year old WF PMHx  HOH, DM 2, CKD 4, hypothyroidism, HTN, and HLD   who was brought to the ED via EMS due to mental status changes and severe profound weakness. According to the family the patient had been sick for approximately 1 week. She was tested 9/3 and found to be positive for Covid. She received a Regeneron infusion 9/5. When she presented for the infusion she was found to be hypotensive with systolics in the 07P. She was treated with a bolus of fluid. Oxygen saturations at that time varied between 84 to 96% on room air. After volume resuscitation she had stabilized and was able to be discharged home. After returning home unfortunately she continued to decline. She developed low-grade fevers. When she became confused the family brought her back to the ED.  Hospital Course:  See above  Procedures: 9/11 CT abdomen pelvis W0 contrast;-large retroperitoneal hematoma extending from the mid RIGHT kidney level to the RIGHT inguinal region. Hematoma volume equal 400 cubic cm. High-density hematoma within the RIGHT psoas muscle may be source of hematoma. -.Large nonobstructing calculus in the RIGHT renal pelvis. -Atrophic LEFT kidney. -Gallstone without evidence cholecystitis. 9/16 CT abdomen pelvis W0 contrast;-grossly stable large retroperitoneal hematoma (that appears to involve the right psoas muscle superiorly) extending from the level of the hemidiaphragm just posterior to the liver to the right groin. Right psoas muscle as well as the liver (uncommon but possible source of retroperitoneal hematoma) remain in the differential for etiology of the  hematoma. -Stable slightly increased mild to moderate right hydronephrosis with etiology likely related to the right ureteral course along the retroperitoneal hematoma. -Interval increase in small right pleural effusion. -Interval development of trace left pleural effusion. -Nonobstructive 1.6 cm in punctate right nephrolithiasis. Atrophic left kidney.  -Cholelithiasis. At least moderate volume hiatal hernia. -L1 vertebral body compression fracture with greater than 95% height loss.  -Left lower lobe 4 mm sub solid pulmonary nodule. -Aortic aneurysm NOS (ICD10-I71.9). -9/19 s/p CYSTOSCOPY WITH RETROGRADE PYELOGRAM/URETERAL STENT PLACEMENT (Right).   Ventilator settings Nasal cannula  9/20 Flow 1 L/min SPO2 91%   Consultations: Phone consult Dr. Clovis Riley radiology  Dr. Tresa Moore urology   Cultures  9/3/SARS coronavirus positive 9/16 urine culture positive E. coli pansensitive   Antibiotics Anti-infectives (From admission, onward)   Start     Ordered Stop   02/19/20 1000  amoxicillin-clavulanate (AUGMENTIN) 875-125 MG per tablet 1 tablet        02/19/20 0944     02/16/20 2200  cefTRIAXone (ROCEPHIN) 2 g in sodium chloride 0.9 % 100 mL IVPB  Status:  Discontinued        02/16/20 2105 02/19/20 0944   02/06/20 1000  remdesivir 100 mg in sodium chloride 0.9 % 100 mL IVPB       "Followed by" Linked Group Details   02/05/20 1536 02/09/20 1028   02/05/20 1600  remdesivir 200 mg in sodium chloride 0.9% 250 mL IVPB       "Followed by" Linked Group Details  02/05/20 1536 02/05/20 2002       Discharge Exam: Vitals:   02/18/20 0957 02/18/20 1353 02/18/20 2022 02/19/20 0416  BP:  (!) 106/53 (!) 134/54 (!) 109/55  Pulse:  85 81 76  Resp:  15 (!) 21 18  Temp:  98.5 F (36.9 C) 98.9 F (37.2 C) 98.5 F (36.9 C)  TempSrc:   Oral   SpO2: 95% 96% 95% 91%  Weight:      Height:        General: A/O x4, positive minimal acute respiratory distress Eyes: negative scleral hemorrhage,  negative anisocoria, negative icterus ENT: Negative Runny nose, negative gingival bleeding, Neck:  Negative scars, masses, torticollis, lymphadenopathy, JVD Lungs: Clear to auscultation bilaterally without wheezes or crackles Cardiovascular: Regular rate and rhythm without murmur gallop or rub normal S1 and S2 Abdomen: Positive abdominal pain right lateral aspect of abdomen from lower rib down to hip, nondistended, positive soft, bowel sounds, no rebound, no ascites, no appreciable mass   Discharge Instructions   Allergies as of 02/19/2020   No Known Allergies     Medication List    TAKE these medications   acetaminophen 500 MG tablet Commonly known as: TYLENOL Take 500 mg by mouth every 6 (six) hours as needed.   amoxicillin-clavulanate 875-125 MG tablet Commonly known as: AUGMENTIN Take 1 tablet by mouth every 12 (twelve) hours.   aspirin EC 81 MG tablet Take 81 mg by mouth daily.   atorvastatin 10 MG tablet Commonly known as: LIPITOR Take 10 mg by mouth daily.   famotidine 20 MG tablet Commonly known as: Pepcid One after supper What changed:   how much to take  how to take this  when to take this   glimepiride 4 MG tablet Commonly known as: AMARYL Take 4 mg by mouth daily with breakfast.   Ipratropium-Albuterol 20-100 MCG/ACT Aers respimat Commonly known as: COMBIVENT Inhale 1 puff into the lungs every 6 (six) hours as needed for wheezing or shortness of breath.   levothyroxine 50 MCG tablet Commonly known as: SYNTHROID Take 50 mcg by mouth daily before breakfast.   loratadine 10 MG tablet Commonly known as: CLARITIN Take 10 mg by mouth daily.   montelukast 10 MG tablet Commonly known as: SINGULAIR Take 10 mg by mouth at bedtime.   multivitamin capsule Take 1 capsule by mouth daily.   pantoprazole 40 MG tablet Commonly known as: Protonix Take 1 tablet (40 mg total) by mouth daily. Take 30-60 min before first meal of the day      No Known  Allergies  Follow-up Information    [name], MD. Schedule an appointment as soon as possible for a visit in 1 month(s).   Specialty: Urology Why: Schedule follow-up with Dr. [name] [facility] in 30 days RIGHT hydronephrosis s/p stent placement Contact information: [place] [address] [phone]                The results of significant diagnostics from this hospitalization (including imaging, microbiology, ancillary and laboratory) are listed below for reference.    Significant Diagnostic Studies: CT ABDOMEN PELVIS WO CONTRAST  Result Date: 02/15/2020 CLINICAL DATA:  Decreasing hemoglobin and hematocrit in a patient with previous retroperitoneal bleed. EXAM: CT ABDOMEN AND PELVIS WITHOUT CONTRAST TECHNIQUE: Multidetector CT imaging of the abdomen and pelvis was performed following the standard protocol without IV contrast. COMPARISON:  CT abdomen pelvis 02/10/2020, 10/27/2006. FINDINGS: Lower chest: Interval increase in a trace to small volume  right pleural effusion. Interval development of a trace left pleural effusion. Associated bilateral lower lobe passive atelectasis. Linear atelectasis within the right lower lobe. 4 mm left lower lobe subsolid nodule (2:8). Aortic root calcifications. At least 3 vessel mild coronary artery calcifications. At least moderate volume hiatal hernia. Hepatobiliary: No focal liver abnormality is seen. Calcified gallstones within the gallbladder lumen. No gallbladder wall thickening or para cholecystic fluid. No biliary ductal dilatation. Pancreas: Unremarkable. No pancreatic ductal dilatation or surrounding inflammatory changes. Spleen: Normal in size without focal abnormality. Adrenals/Urinary Tract: No adrenal nodule bilaterally. Redemonstration of a well-defined round 1.6 cm calcified gallstone within the inferior pole of the right kidney. Associated punctate calculi is noted subjacent. Stable to slightly worsened mild  to moderate right hydronephrosis. The right ureter appears to course along the retroperitoneal hematoma is poorly visualized. No ureterolithiasis identified. The left kidney is atrophic. No left hydronephrosis. No contour-deforming renal mass. No ureterolithiasis or hydroureter. The urinary bladder is unremarkable. Stomach/Bowel: Stomach is within normal limits. Appendix appears normal. No evidence of bowel wall thickening, distention, or inflammatory changes. Scattered sigmoid diverticulosis. Vascular/Lymphatic: Stable ectatic infrarenal abdominal aorta with a caliber of 3.5 cm on axial imaging (3:32). No iliac artery aneurysm. Severe atherosclerotic plaque of the aorta and its branches. No abdominal, pelvic, or inguinal lymphadenopathy. Reproductive: Uterus and bilateral adnexa are unremarkable. Other: Similar-appearing bilobed retroperitoneal hematoma that originates at the level of the hemidiaphragm open (2:22), courses along the right psoas muscle and medial to the right renal space as it travels inferiorly to terminate at the level of right groin open (2:60). Superior-most largest component measuring 7.5 x 7.7 cm on axial imaging (2:39) and inferior-most largest component measuring 10.5 x 5.2 cm on axial imaging (2:53, 5:42). The hematoma appears to involve the right psoas muscle superiorly (2:32-40). A separate the contiguous component of the hematoma is again noted laterally and measures up to 7.6 cm on axial imaging (2:44). Similar appearing area of hyperdensity measuring 3 cm on axial imaging and extending approximately 10 cm in the craniocaudal dimension likely represents clotted blood. Similar area of layering hyperdensity posterior to the liver along the right hemidiaphragm best imaged on sagittal view (6:41). No simple free fluid ascites. No intraperitoneal free gas. No organized fluid collection. Musculoskeletal: Subcutaneous soft tissue edema and stranding along the deep abdominal wall. Diffusely  decreased bone density. Multilevel degenerative changes of the spine. Similar-appearing grade 2 anterolisthesis of L5 on S1. Similar-appearing compression fracture of the L1 vertebral body with greater than 95% height loss. No acute displaced fracture. No suspicious lytic or blastic osseous lesions. IMPRESSION: 1. Grossly stable large retroperitoneal hematoma (that appears to involve the right psoas muscle superiorly) extending from the level of the hemidiaphragm just posterior to the liver to the right groin. Right psoas muscle as well as the liver (uncommon but possible source of retroperitoneal hematoma) remain in the differential for etiology of the hematoma. 2. Stable slightly increased mild to moderate right hydronephrosis with etiology likely related to the right ureteral course along the retroperitoneal hematoma. 3. Interval increase in small right pleural effusion. 4. Interval development of trace left pleural effusion. 5. Other imaging findings of potential clinical significance: Nonobstructive 1.6 cm in punctate right nephrolithiasis. Atrophic left kidney. Cholelithiasis. At least moderate volume hiatal hernia. L1 vertebral body compression fracture with greater than 95% height loss. Left lower lobe 4 mm sub solid pulmonary nodule. 6. Aortic aneurysm NOS (ICD10-I71.9). 7. Aortic Atherosclerosis (ICD10-I70.0). Electronically Signed   By: Iven Finn  M.D.   On: 02/15/2020 19:12   CT ABDOMEN PELVIS WO CONTRAST  Result Date: 02/10/2020 CLINICAL DATA:  Suspicion of retroperitoneal hematoma. EXAM: CT ABDOMEN AND PELVIS WITHOUT CONTRAST TECHNIQUE: Multidetector CT imaging of the abdomen and pelvis was performed following the standard protocol without IV contrast. COMPARISON:  None. FINDINGS: Lower chest: Small RIGHT effusion. Hepatobiliary: Gallstones without evidence of cholecystitis. Pancreas: Pancreas is normal. No ductal dilatation. No pancreatic inflammation. Spleen: Normal spleen Adrenals/urinary  tract: Adrenal glands normal. Atrophic LEFT kidney. Large ovoid calculus within the RIGHT renal pelvis measuring 15 mm. There is a large hematoma posterior and medial to the RIGHT renal space paralleling the psoas muscle. Hematoma extends from the mid RIGHT kidney to the RIGHT groin. Hematoma measures 15.5 by 9.6 by 4.8 cm (volume = 370 cm^3). Within the RIGHT psoas muscle there is potential high-density intramuscular hematoma measuring 2.7 cm. (Image 35/2) Stomach/Bowel: Large hiatal hernia. Vascular/Lymphatic: Calcification of the abdominal aorta. Mild saccular aneurysm in the mid abdominal aorta measuring 34 mm. No lymphadenopathy Reproductive: Uterus and adnexa unremarkable. Other: Large retroperitoneal hematoma described in the urinary tract section Musculoskeletal: Chronic anterolisthesis of L5 on S1. Chronic compression fracture at L1. IMPRESSION: 1. Large retroperitoneal hematoma extending from the mid RIGHT kidney level to the RIGHT inguinal region. Hematoma volume equal 400 cubic cm. High-density hematoma within the RIGHT psoas muscle may be source of hematoma. 2. Large nonobstructing calculus in the RIGHT renal pelvis. 3. Atrophic LEFT kidney. 4. Gallstone without evidence cholecystitis. 5.  Aortic Atherosclerosis (ICD10-I70.0). These results will be called to the ordering clinician or representative by the Radiologist Assistant, and communication documented in the PACS or [employer]. Electronically Signed   By: Suzy Bouchard M.D.   On: 02/10/2020 11:03   DG Chest Port 1 View  Result Date: 02/05/2020 CLINICAL DATA:  Shortness of breath and weakness. COVID-19 positive. EXAM: PORTABLE CHEST 1 VIEW COMPARISON:  December 12, 2019 FINDINGS: Cardiomediastinal silhouette is normal. Mediastinal contours appear intact. There is no evidence of focal airspace consolidation, pleural effusion or pneumothorax. Chronic streaky airspace opacities in the left lung base likely represent scarring versus atelectasis.  Osseous structures are without acute abnormality. Soft tissues are grossly normal. IMPRESSION: Chronic streaky airspace opacities in the left lung base likely represent scarring versus atelectasis. Electronically Signed   By: Fidela Salisbury M.D.   On: 02/05/2020 11:50   DG C-Arm 1-60 Min-No Report  Result Date: 02/18/2020 Fluoroscopy was utilized by the requesting physician.  No radiographic interpretation.   DG HIP UNILAT WITH PELVIS 1V RIGHT  Result Date: 02/05/2020 CLINICAL DATA:  RIGHT hip pain for days.  No known injury. EXAM: DG HIP (WITH OR WITHOUT PELVIS) 1V RIGHT COMPARISON:  None. FINDINGS: Osseous alignment is normal. Bone mineralization is normal. No fracture line or displaced fracture fragment is seen. No acute or suspicious osseous lesion. No degenerative change at the RIGHT hip joint. Atherosclerotic calcifications are seen along the course of the SFA. Soft tissues about the RIGHT hip are otherwise unremarkable. Pelvic catheter in place. IMPRESSION: 1. No acute findings. No degenerative change at the RIGHT hip joint. 2. Atherosclerosis. Electronically Signed   By: Franki Cabot M.D.   On: 02/05/2020 16:43    Microbiology: Recent Results (from the past 240 hour(s))  Culture, Urine     Status: Abnormal   Collection Time: 02/15/20  6:33 PM   Specimen: Urine, Clean Catch  Result Value Ref Range Status   Specimen Description   Final    URINE,  CLEAN CATCH Performed at [facility], [place] [address] [place], [place] [address]    Special Requests   Final    NONE Performed at [facility], [place] [address] [place], [place] [address]    Culture >=100,000 COLONIES/mL ESCHERICHIA COLI (A)  Final   Report Status 02/17/2020 FINAL  Final   Organism ID, Bacteria ESCHERICHIA COLI (A)  Final      Susceptibility   Escherichia coli - MIC*    AMPICILLIN <=2 SENSITIVE Sensitive     CEFAZOLIN <=4 SENSITIVE Sensitive     CEFTRIAXONE <=0.25 SENSITIVE  Sensitive     CIPROFLOXACIN <=0.25 SENSITIVE Sensitive     GENTAMICIN <=1 SENSITIVE Sensitive     IMIPENEM <=0.25 SENSITIVE Sensitive     NITROFURANTOIN <=16 SENSITIVE Sensitive     TRIMETH/SULFA <=20 SENSITIVE Sensitive     AMPICILLIN/SULBACTAM <=2 SENSITIVE Sensitive     PIP/TAZO <=4 SENSITIVE Sensitive     * >=100,000 COLONIES/mL ESCHERICHIA COLI  MRSA PCR Screening     Status: None   Collection Time: 02/18/20  3:06 AM   Specimen: Nasal Mucosa; Nasopharyngeal  Result Value Ref Range Status   MRSA by PCR NEGATIVE NEGATIVE Final    Comment:        The GeneXpert MRSA Assay (FDA approved for NASAL specimens only), is one component of a comprehensive MRSA colonization surveillance program. It is not intended to diagnose MRSA infection nor to guide or monitor treatment for MRSA infections. Performed at [facility], [place] [address], [place], [place] [address]      Labs: Basic Metabolic Panel: Recent Labs  Lab 02/14/20 1227 02/14/20 1227 02/15/20 0410 02/16/20 0406 02/17/20 0439 02/18/20 0647 02/19/20 0537  NA 139  --  136 133* 140 137  --   K 3.8  --  3.3* 4.1 3.3* 4.3  --   CL 106  --  108 104 109 108  --   CO2 21*  --  22 22 24 22   --   GLUCOSE 200*  --  186* 168* 185* 184*  --   BUN 22  --  18 18 16 15   --   CREATININE 1.35*  --  1.28* 1.34* 1.41* 1.33*  --   CALCIUM 8.8*  --  8.2* 8.0* 8.5* 8.4*  --   MG 2.0   < > 1.7 2.3 2.2 2.1 2.0  PHOS 1.6*   < > 1.4* 3.3 2.0* 1.6* 3.1   < > = values in this interval not displayed.   Liver Function Tests: Recent Labs  Lab 02/14/20 1227 02/15/20 0410 02/16/20 0406 02/17/20 0439 02/18/20 0647  AST 29 20 24 23 23   ALT 35 25 26 24 22   ALKPHOS 64 54 60 68 69  BILITOT 1.8* 1.6* 1.6* 1.5* 1.2  PROT 5.9* 4.9* 5.3* 5.5* 5.3*  ALBUMIN 2.7* 2.2* 2.3* 2.4* 2.4*   No results for input(s): LIPASE, AMYLASE in the last 168 hours. No results for input(s): AMMONIA in the last 168 hours. CBC: Recent Labs  Lab  02/14/20 1227 02/15/20 0410 02/16/20 0406 02/17/20 0439 02/18/20 0647  WBC 14.6* 10.9* 9.9 10.9* 9.5  NEUTROABS 12.6* 8.6* 7.6 8.8* 7.3  HGB 11.8* 10.9* 10.6* 10.7* 11.3*  HCT 38.4 34.8* 33.6* 33.3* 36.2  MCV 94.1 93.5 91.1 91.7 94.0  PLT 310 264 295 420* 412*   Cardiac Enzymes: No results for input(s): CKTOTAL, CKMB, CKMBINDEX, TROPONINI in the last 168 hours. BNP: BNP (last 3 results) No  results for input(s): BNP in the last 8760 hours.  ProBNP (last 3 results) Recent Labs    12/12/19 1455  PROBNP 201.0*    CBG: Recent Labs  Lab 02/18/20 0421 02/18/20 0720 02/18/20 1127 02/18/20 1610 02/19/20 0747  GLUCAP 161* 175* 182* 316* 229*       Signed:  Dia Crawford, MD [facility] [phone] pager

## 2020-02-19 NOTE — Discharge Instructions (Signed)
10 Things You Can Do to Manage Your COVID-19 Symptoms at Home If you have possible or confirmed COVID-19: 1. Stay home from work and school. And stay away from other public places. If you must go out, avoid using any kind of public transportation, ridesharing, or taxis. 2. Monitor your symptoms carefully. If your symptoms get worse, call your healthcare provider immediately. 3. Get rest and stay hydrated. 4. If you have a medical appointment, call the healthcare provider ahead of time and tell them that you have or may have COVID-19. 5. For medical emergencies, call 911 and notify the dispatch personnel that you have or may have COVID-19. 6. Cover your cough and sneezes with a tissue or use the inside of your elbow. 7. Wash your hands often with soap and water for at least 20 seconds or clean your hands with an alcohol-based hand sanitizer that contains at least 60% alcohol. 8. As much as possible, stay in a specific room and away from other people in your home. Also, you should use a separate bathroom, if available. If you need to be around other people in or outside of the home, wear a mask. 9. Avoid sharing personal items with other people in your household, like dishes, towels, and bedding. 10. Clean all surfaces that are touched often, like counters, tabletops, and doorknobs. Use household cleaning sprays or wipes according to the label instructions. cdc.gov/coronavirus 11/30/2018 This information is not intended to replace advice given to you by your health care provider. Make sure you discuss any questions you have with your health care provider. Document Revised: 05/04/2019 Document Reviewed: 05/04/2019 Elsevier Patient Education  2020 Elsevier Inc.   COVID-19 COVID-19 is a respiratory infection that is caused by a virus called severe acute respiratory syndrome coronavirus 2 (SARS-CoV-2). The disease is also known as coronavirus disease or novel coronavirus. In some people, the virus may  not cause any symptoms. In others, it may cause a serious infection. The infection can get worse quickly and can lead to complications, such as:  Pneumonia, or infection of the lungs.  Acute respiratory distress syndrome or ARDS. This is a condition in which fluid build-up in the lungs prevents the lungs from filling with air and passing oxygen into the blood.  Acute respiratory failure. This is a condition in which there is not enough oxygen passing from the lungs to the body or when carbon dioxide is not passing from the lungs out of the body.  Sepsis or septic shock. This is a serious bodily reaction to an infection.  Blood clotting problems.  Secondary infections due to bacteria or fungus.  Organ failure. This is when your body's organs stop working. The virus that causes COVID-19 is contagious. This means that it can spread from person to person through droplets from coughs and sneezes (respiratory secretions). What are the causes? This illness is caused by a virus. You may catch the virus by:  Breathing in droplets from an infected person. Droplets can be spread by a person breathing, speaking, singing, coughing, or sneezing.  Touching something, like a table or a doorknob, that was exposed to the virus (contaminated) and then touching your mouth, nose, or eyes. What increases the risk? Risk for infection You are more likely to be infected with this virus if you:  Are within 6 feet (2 meters) of a person with COVID-19.  Provide care for or live with a person who is infected with COVID-19.  Spend time in crowded indoor spaces or   live in shared housing. Risk for serious illness You are more likely to become seriously ill from the virus if you:  Are 50 years of age or older. The higher your age, the more you are at risk for serious illness.  Live in a nursing home or long-term care facility.  Have cancer.  Have a long-term (chronic) disease such as: ? Chronic lung disease,  including chronic obstructive pulmonary disease or asthma. ? A long-term disease that lowers your body's ability to fight infection (immunocompromised). ? Heart disease, including heart failure, a condition in which the arteries that lead to the heart become narrow or blocked (coronary artery disease), a disease which makes the heart muscle thick, weak, or stiff (cardiomyopathy). ? Diabetes. ? Chronic kidney disease. ? Sickle cell disease, a condition in which red blood cells have an abnormal "sickle" shape. ? Liver disease.  Are obese. What are the signs or symptoms? Symptoms of this condition can range from mild to severe. Symptoms may appear any time from 2 to 14 days after being exposed to the virus. They include:  A fever or chills.  A cough.  Difficulty breathing.  Headaches, body aches, or muscle aches.  Runny or stuffy (congested) nose.  A sore throat.  New loss of taste or smell. Some people may also have stomach problems, such as nausea, vomiting, or diarrhea. Other people may not have any symptoms of COVID-19. How is this diagnosed? This condition may be diagnosed based on:  Your signs and symptoms, especially if: ? You live in an area with a COVID-19 outbreak. ? You recently traveled to or from an area where the virus is common. ? You provide care for or live with a person who was diagnosed with COVID-19. ? You were exposed to a person who was diagnosed with COVID-19.  A physical exam.  Lab tests, which may include: ? Taking a sample of fluid from the back of your nose and throat (nasopharyngeal fluid), your nose, or your throat using a swab. ? A sample of mucus from your lungs (sputum). ? Blood tests.  Imaging tests, which may include, X-rays, CT scan, or ultrasound. How is this treated? At present, there is no medicine to treat COVID-19. Medicines that treat other diseases are being used on a trial basis to see if they are effective against COVID-19. Your  health care provider will talk with you about ways to treat your symptoms. For most people, the infection is mild and can be managed at home with rest, fluids, and over-the-counter medicines. Treatment for a serious infection usually takes places in a hospital intensive care unit (ICU). It may include one or more of the following treatments. These treatments are given until your symptoms improve.  Receiving fluids and medicines through an IV.  Supplemental oxygen. Extra oxygen is given through a tube in the nose, a face mask, or a hood.  Positioning you to lie on your stomach (prone position). This makes it easier for oxygen to get into the lungs.  Continuous positive airway pressure (CPAP) or bi-level positive airway pressure (BPAP) machine. This treatment uses mild air pressure to keep the airways open. A tube that is connected to a motor delivers oxygen to the body.  Ventilator. This treatment moves air into and out of the lungs by using a tube that is placed in your windpipe.  Tracheostomy. This is a procedure to create a hole in the neck so that a breathing tube can be inserted.  Extracorporeal membrane   oxygenation (ECMO). This procedure gives the lungs a chance to recover by taking over the functions of the heart and lungs. It supplies oxygen to the body and removes carbon dioxide. Follow these instructions at home: Lifestyle  If you are sick, stay home except to get medical care. Your health care provider will tell you how long to stay home. Call your health care provider before you go for medical care.  Rest at home as told by your health care provider.  Do not use any products that contain nicotine or tobacco, such as cigarettes, e-cigarettes, and chewing tobacco. If you need help quitting, ask your health care provider.  Return to your normal activities as told by your health care provider. Ask your health care provider what activities are safe for you. General  instructions  Take over-the-counter and prescription medicines only as told by your health care provider.  Drink enough fluid to keep your urine pale yellow.  Keep all follow-up visits as told by your health care provider. This is important. How is this prevented?  There is no vaccine to help prevent COVID-19 infection. However, there are steps you can take to protect yourself and others from this virus. To protect yourself:   Do not travel to areas where COVID-19 is a risk. The areas where COVID-19 is reported change often. To identify high-risk areas and travel restrictions, check the CDC travel website: wwwnc.cdc.gov/travel/notices  If you live in, or must travel to, an area where COVID-19 is a risk, take precautions to avoid infection. ? Stay away from people who are sick. ? Wash your hands often with soap and water for 20 seconds. If soap and water are not available, use an alcohol-based hand sanitizer. ? Avoid touching your mouth, face, eyes, or nose. ? Avoid going out in public, follow guidance from your state and local health authorities. ? If you must go out in public, wear a cloth face covering or face mask. Make sure your mask covers your nose and mouth. ? Avoid crowded indoor spaces. Stay at least 6 feet (2 meters) away from others. ? Disinfect objects and surfaces that are frequently touched every day. This may include:  Counters and tables.  Doorknobs and light switches.  Sinks and faucets.  Electronics, such as phones, remote controls, keyboards, computers, and tablets. To protect others: If you have symptoms of COVID-19, take steps to prevent the virus from spreading to others.  If you think you have a COVID-19 infection, contact your health care provider right away. Tell your health care team that you think you may have a COVID-19 infection.  Stay home. Leave your house only to seek medical care. Do not use public transport.  Do not travel while you are  sick.  Wash your hands often with soap and water for 20 seconds. If soap and water are not available, use alcohol-based hand sanitizer.  Stay away from other members of your household. Let healthy household members care for children and pets, if possible. If you have to care for children or pets, wash your hands often and wear a mask. If possible, stay in your own room, separate from others. Use a different bathroom.  Make sure that all people in your household wash their hands well and often.  Cough or sneeze into a tissue or your sleeve or elbow. Do not cough or sneeze into your hand or into the air.  Wear a cloth face covering or face mask. Make sure your mask covers your nose   and mouth. Where to find more information  Centers for Disease Control and Prevention: PurpleGadgets.be  World Health Organization: https://www.castaneda.info/ Contact a health care provider if:  You live in or have traveled to an area where COVID-19 is a risk and you have symptoms of the infection.  You have had contact with someone who has COVID-19 and you have symptoms of the infection. Get help right away if:  You have trouble breathing.  You have pain or pressure in your chest.  You have confusion.  You have bluish lips and fingernails.  You have difficulty waking from sleep.  You have symptoms that get worse. These symptoms may represent a serious problem that is an emergency. Do not wait to see if the symptoms will go away. Get medical help right away. Call your local emergency services (911 in the [place] Do not drive yourself to the hospital. Let the emergency medical personnel know if you think you have COVID-19. Summary  COVID-19 is a respiratory infection that is caused by a virus. It is also known as coronavirus disease or novel coronavirus. It can cause serious infections, such as pneumonia, acute respiratory distress syndrome, acute respiratory failure,  or sepsis.  The virus that causes COVID-19 is contagious. This means that it can spread from person to person through droplets from breathing, speaking, singing, coughing, or sneezing.  You are more likely to develop a serious illness if you are 61 years of age or older, have a weak immune system, live in a nursing home, or have chronic disease.  There is no medicine to treat COVID-19. Your health care provider will talk with you about ways to treat your symptoms.  Take steps to protect yourself and others from infection. Wash your hands often and disinfect objects and surfaces that are frequently touched every day. Stay away from people who are sick and wear a mask if you are sick. This information is not intended to replace advice given to you by your health care provider. Make sure you discuss any questions you have with your health care provider. Document Revised: 03/17/2019 Document Reviewed: 06/23/2018 Elsevier Patient Education  2020 [employer].   COVID-19 Frequently Asked Questions COVID-19 (coronavirus disease) is an infection that is caused by a large family of viruses. Some viruses cause illness in people and others cause illness in animals like camels, cats, and bats. In some cases, the viruses that cause illness in animals can spread to humans. Where did the coronavirus come from? In December 2019, [place] told the [facility]) of several cases of lung disease (human respiratory illness). These cases were linked to an open seafood and livestock market in the city of [place]. The link to the seafood and livestock market suggests that the virus may have spread from animals to humans. However, since that first outbreak in December, the virus has also been shown to spread from person to person. What is the name of the disease and the virus? Disease name Early on, this disease was called novel coronavirus. This is because scientists determined that the disease was caused  by a new (novel) respiratory virus. The World Health Organization [facility]) has now named the disease COVID-19, or coronavirus disease. Virus name The virus that causes the disease is called severe acute respiratory syndrome coronavirus 2 (SARS-CoV-2). More information on disease and virus naming World Health Organization [facility]): www.who.int/emergencies/diseases/novel-coronavirus-2019/technical-guidance/naming-the-coronavirus-disease-(covid-2019)-and-the-virus-that-causes-it Who is at risk for complications from coronavirus disease? Some people may be at higher risk for complications from coronavirus  disease. This includes older adults and people who have chronic diseases, such as heart disease, diabetes, and lung disease. If you are at higher risk for complications, take these extra precautions:  Stay home as much as possible.  Avoid social gatherings and travel.  Avoid close contact with others. Stay at least 6 ft (2 m) away from others, if possible.  Wash your hands often with soap and water for at least 20 seconds.  Avoid touching your face, mouth, nose, or eyes.  Keep supplies on hand at home, such as food, medicine, and cleaning supplies.  If you must go out in public, wear a cloth face covering or face mask. Make sure your mask covers your nose and mouth. How does coronavirus disease spread? The virus that causes coronavirus disease spreads easily from person to person (is contagious). You may catch the virus by:  Breathing in droplets from an infected person. Droplets can be spread by a person breathing, speaking, singing, coughing, or sneezing.  Touching something, like a table or a doorknob, that was exposed to the virus (contaminated) and then touching your mouth, nose, or eyes. Can I get the virus from touching surfaces or objects? There is still a lot that we do not know about the virus that causes coronavirus disease. Scientists are basing a lot of information on what they know  about similar viruses, such as:  Viruses cannot generally survive on surfaces for long. They need a human body (host) to survive.  It is more likely that the virus is spread by close contact with people who are sick (direct contact), such as through: ? Shaking hands or hugging. ? Breathing in respiratory droplets that travel through the air. Droplets can be spread by a person breathing, speaking, singing, coughing, or sneezing.  It is less likely that the virus is spread when a person touches a surface or object that has the virus on it (indirect contact). The virus may be able to enter the body if the person touches a surface or object and then touches his or her face, eyes, nose, or mouth. Can a person spread the virus without having symptoms of the disease? It may be possible for the virus to spread before a person has symptoms of the disease, but this is most likely not the main way the virus is spreading. It is more likely for the virus to spread by being in close contact with people who are sick and breathing in the respiratory droplets spread by a person breathing, speaking, singing, coughing, or sneezing. What are the symptoms of coronavirus disease? Symptoms vary from person to person and can range from mild to severe. Symptoms may include:  Fever or chills.  Cough.  Difficulty breathing or feeling short of breath.  Headaches, body aches, or muscle aches.  Runny or stuffy (congested) nose.  Sore throat.  New loss of taste or smell.  Nausea, vomiting, or diarrhea. These symptoms can appear anywhere from 2 to 14 days after you have been exposed to the virus. Some people may not have any symptoms. If you develop symptoms, call your health care provider. People with severe symptoms may need hospital care. Should I be tested for this virus? Your health care provider will decide whether to test you based on your symptoms, history of exposure, and your risk factors. How does a  health care provider test for this virus? Health care providers will collect samples to send for testing. Samples may include:  Taking a swab  of fluid from the back of your nose and throat, your nose, or your throat.  Taking fluid from the lungs by having you cough up mucus (sputum) into a sterile cup.  Taking a blood sample. Is there a treatment or vaccine for this virus? Currently, there is no vaccine to prevent coronavirus disease. Also, there are no medicines like antibiotics or antivirals to treat the virus. A person who becomes sick is given supportive care, which means rest and fluids. A person may also relieve his or her symptoms by using over-the-counter medicines that treat sneezing, coughing, and runny nose. These are the same medicines that a person takes for the common cold. If you develop symptoms, call your health care provider. People with severe symptoms may need hospital care. What can I do to protect myself and my family from this virus?     You can protect yourself and your family by taking the same actions that you would take to prevent the spread of other viruses. Take the following actions:  Wash your hands often with soap and water for at least 20 seconds. If soap and water are not available, use alcohol-based hand sanitizer.  Avoid touching your face, mouth, nose, or eyes.  Cough or sneeze into a tissue, sleeve, or elbow. Do not cough or sneeze into your hand or the air. ? If you cough or sneeze into a tissue, throw it away immediately and wash your hands.  Disinfect objects and surfaces that you frequently touch every day.  Stay away from people who are sick.  Avoid going out in public, follow guidance from your state and local health authorities.  Avoid crowded indoor spaces. Stay at least 6 ft (2 m) away from others.  If you must go out in public, wear a cloth face covering or face mask. Make sure your mask covers your nose and mouth.  Stay home if you  are sick, except to get medical care. Call your health care provider before you get medical care. Your health care provider will tell you how long to stay home.  Make sure your vaccines are up to date. Ask your health care provider what vaccines you need. What should I do if I need to travel? Follow travel recommendations from your local health authority, the CDC, and WHO. Travel information and advice  Centers for Disease Control and Prevention (CDC): BodyEditor.hu  World Health Organization [facility]): ThirdIncome.ca Know the risks and take action to protect your health  You are at higher risk of getting coronavirus disease if you are traveling to areas with an outbreak or if you are exposed to travelers from areas with an outbreak.  Wash your hands often and practice good hygiene to lower the risk of catching or spreading the virus. What should I do if I am sick? General instructions to stop the spread of infection  Wash your hands often with soap and water for at least 20 seconds. If soap and water are not available, use alcohol-based hand sanitizer.  Cough or sneeze into a tissue, sleeve, or elbow. Do not cough or sneeze into your hand or the air.  If you cough or sneeze into a tissue, throw it away immediately and wash your hands.  Stay home unless you must get medical care. Call your health care provider or local health authority before you get medical care.  Avoid public areas. Do not take public transportation, if possible.  If you can, wear a mask if you must go out  of the house or if you are in close contact with someone who is not sick. Make sure your mask covers your nose and mouth. Keep your home clean  Disinfect objects and surfaces that are frequently touched every day. This may include: ? Counters and tables. ? Doorknobs and light switches. ? Sinks and faucets. ? Electronics  such as phones, remote controls, keyboards, computers, and tablets.  Wash dishes in hot, soapy water or use a dishwasher. Air-dry your dishes.  Wash laundry in hot water. Prevent infecting other household members  Let healthy household members care for children and pets, if possible. If you have to care for children or pets, wash your hands often and wear a mask.  Sleep in a different bedroom or bed, if possible.  Do not share personal items, such as razors, toothbrushes, deodorant, combs, brushes, towels, and washcloths. Where to find more information Centers for Disease Control and Prevention (CDC)  Information and news updates: https://www.butler-gonzalez.com/ World Health Organization [facility])  Information and news updates: MissExecutive.com.ee  Coronavirus health topic: https://www.castaneda.info/  Questions and answers on COVID-19: OpportunityDebt.at  Global tracker: who.sprinklr.com American Academy of Pediatrics (AAP)  Information for families: www.healthychildren.org/English/health-issues/conditions/chest-lungs/Pages/2019-Novel-Coronavirus.aspx The coronavirus situation is changing rapidly. Check your local health authority website or the CDC and [facility] websites for updates and news. When should I contact a health care provider?  Contact your health care provider if you have symptoms of an infection, such as fever or cough, and you: ? Have been near anyone who is known to have coronavirus disease. ? Have come into contact with a person who is suspected to have coronavirus disease. ? Have traveled to an area where there is an outbreak of COVID-19. When should I get emergency medical care?  Get help right away by calling your local emergency services (911 in the [place] if you have: ? Trouble breathing. ? Pain or pressure in your chest. ? Confusion. ? Blue-tinged lips and fingernails. ? Difficulty  waking from sleep. ? Symptoms that get worse. Let the emergency medical personnel know if you think you have coronavirus disease. Summary  A new respiratory virus is spreading from person to person and causing COVID-19 (coronavirus disease).  The virus that causes COVID-19 appears to spread easily. It spreads from one person to another through droplets from breathing, speaking, singing, coughing, or sneezing.  Older adults and those with chronic diseases are at higher risk of disease. If you are at higher risk for complications, take extra precautions.  There is currently no vaccine to prevent coronavirus disease. There are no medicines, such as antibiotics or antivirals, to treat the virus.  You can protect yourself and your family by washing your hands often, avoiding touching your face, and covering your coughs and sneezes. This information is not intended to replace advice given to you by your health care provider. Make sure you discuss any questions you have with your health care provider. Document Revised: 03/17/2019 Document Reviewed: 09/13/2018 Elsevier Patient Education  [place].   Acute Respiratory Failure, Adult  Acute respiratory failure occurs when there is not enough oxygen passing from your lungs to your body. When this happens, your lungs have trouble removing carbon dioxide from the blood. This causes your blood oxygen level to drop too low as carbon dioxide builds up. Acute respiratory failure is a medical emergency. It can develop quickly, but it is temporary if treated promptly. Your lung capacity, or how much air your lungs can hold, may improve with time, exercise,  and treatment. What are the causes? There are many possible causes of acute respiratory failure, including:  Lung injury.  Chest injury or damage to the ribs or tissues near the lungs.  Lung conditions that affect the flow of air and blood into and out of the lungs, such as pneumonia, acute  respiratory distress syndrome, and cystic fibrosis.  Medical conditions, such as strokes or spinal cord injuries, that affect the muscles and nerves that control breathing.  Blood infection (sepsis).  Inflammation of the pancreas (pancreatitis).  A blood clot in the lungs (pulmonary embolism).  A large-volume blood transfusion.  Burns.  Near-drowning.  Seizure.  Smoke inhalation.  Reaction to medicines.  Alcohol or drug overdose. What increases the risk? This condition is more likely to develop in people who have:  A blocked airway.  Asthma.  A condition or disease that damages or weakens the muscles, nerves, bones, or tissues that are involved in breathing.  A serious infection.  A health problem that blocks the unconscious reflex that is involved in breathing, such as hypothyroidism or sleep apnea.  A lung injury or trauma. What are the signs or symptoms? Trouble breathing is the main symptom of acute respiratory failure. Symptoms may also include:  Rapid breathing.  Restlessness or anxiety.  Skin, lips, or fingernails that appear blue (cyanosis).  Rapid heart rate.  Abnormal heart rhythms (arrhythmias).  Confusion or changes in behavior.  Tiredness or loss of energy.  Feeling sleepy or having a loss of consciousness. How is this diagnosed? Your health care provider can diagnose acute respiratory failure with a medical history and physical exam. During the exam, your health care provider will listen to your heart and check for crackling or wheezing sounds in your lungs. Your may also have tests to confirm the diagnosis and determine what is causing respiratory failure. These tests may include:  Measuring the amount of oxygen in your blood (pulse oximetry). The measurement comes from a small device that is placed on your finger, earlobe, or toe.  Other blood tests to measure blood gases and to look for signs of infection.  Sampling your cerebral spinal  fluid or tracheal fluid to check for infections.  Chest X-ray to look for fluid in spaces that should be filled with air.  Electrocardiogram (ECG) to look at the heart's electrical activity. How is this treated? Treatment for this condition usually takes places in a hospital intensive care unit (ICU). Treatment depends on what is causing the condition. It may include one or more treatments until your symptoms improve. Treatment may include:  Supplemental oxygen. Extra oxygen is given through a tube in the nose, a face mask, or a hood.  A device such as a continuous positive airway pressure (CPAP) or bi-level positive airway pressure (BiPAP or BPAP) machine. This treatment uses mild air pressure to keep the airways open. A mask or other device will be placed over your nose or mouth. A tube that is connected to a motor will deliver oxygen through the mask.  Ventilator. This treatment helps move air into and out of the lungs. This may be done with a bag and mask or a machine. For this treatment, a tube is placed in your windpipe (trachea) so air and oxygen can flow to the lungs.  Extracorporeal membrane oxygenation (ECMO). This treatment temporarily takes over the function of the heart and lungs, supplying oxygen and removing carbon dioxide. ECMO gives the lungs a chance to recover. It may be used if a  ventilator is not effective.  Tracheostomy. This is a procedure that creates a hole in the neck to insert a breathing tube.  Receiving fluids and medicines.  Rocking the bed to help breathing. Follow these instructions at home:  Take over-the-counter and prescription medicines only as told by your health care provider.  Return to normal activities as told by your health care provider. Ask your health care provider what activities are safe for you.  Keep all follow-up visits as told by your health care provider. This is important. How is this prevented? Treating infections and medical  conditions that may lead to acute respiratory failure can help prevent the condition from developing. Contact a health care provider if:  You have a fever.  Your symptoms do not improve or they get worse. Get help right away if:  You are having trouble breathing.  You lose consciousness.  Your have cyanosis or turn blue.  You develop a rapid heart rate.  You are confused. These symptoms may represent a serious problem that is an emergency. Do not wait to see if the symptoms will go away. Get medical help right away. Call your local emergency services (911 in the [place] Do not drive yourself to the hospital. This information is not intended to replace advice given to you by your health care provider. Make sure you discuss any questions you have with your health care provider. Document Revised: 04/30/2017 Document Reviewed: 12/04/2015 Elsevier Patient Education  [place].

## 2020-02-19 NOTE — Progress Notes (Signed)
Gave patient discharge instructions. Patient waiting for her ride.

## 2020-02-19 NOTE — TOC Transition Note (Addendum)
Transition of Care [facility]) - CM/SW Discharge Note   Patient Details  Name: RINI MOFFIT MRN: [id number] Date of Birth: May 16, 1935  Transition of Care [facility]) CM/SW Contact:  Trish Mage, LCSW Phone Number: 02/19/2020, 10:47 AM   Clinical Narrative:   Patient who is discharging today is recommended for [facility] PT.  Spoke to patient who declined service, states her apartment is small and not set up for accomodation of visitors.  States she has all needed DME, and gave me permission to call daughter.  I called with intent of seeing if daughter agrees with patient's decision about [facility] PT, and if she is aware of any other needs.  Left message. TOC will continue to follow during the course of hospitalization.  Addendum:  Heard back from daughter, who states that her mother is "one step removed from being a hoarder," so apartment is quite cluttered and there is limited space with which to work.  She would prefer that her mother go to a clinic for outpatient PT.  I told her that she could go on day 21, and she confirmed that would be Friday.  She will call on Friday to PCP to get order for and set up for outpatient PT. TOC sign off.     Final next level of care: [place] Barriers to Discharge: No Barriers Identified   Patient Goals and CMS Choice        Discharge Placement                       Discharge Plan and Services                                     Social Determinants of Health (SDOH) Interventions     Readmission Risk Interventions No flowsheet data found.

## 2020-03-16 ENCOUNTER — Other Ambulatory Visit: Payer: Self-pay | Admitting: Internal Medicine

## 2020-03-16 DIAGNOSIS — R0609 Other forms of dyspnea: Secondary | ICD-10-CM

## 2020-05-08 ENCOUNTER — Other Ambulatory Visit: Payer: Self-pay | Admitting: Urology

## 2020-05-27 ENCOUNTER — Encounter (HOSPITAL_COMMUNITY): Payer: Self-pay | Admitting: Physician Assistant

## 2020-05-27 ENCOUNTER — Other Ambulatory Visit (HOSPITAL_COMMUNITY)
Admission: RE | Admit: 2020-05-27 | Discharge: 2020-05-27 | Disposition: A | Discharge status: Home / Self Care | Payer: Medicare Other | Source: Ambulatory Visit | Attending: Urology | Admitting: Urology

## 2020-05-27 DIAGNOSIS — Z20822 Contact with and (suspected) exposure to covid-19: Secondary | ICD-10-CM | POA: Diagnosis not present

## 2020-05-27 DIAGNOSIS — Z01812 Encounter for preprocedural laboratory examination: Secondary | ICD-10-CM | POA: Diagnosis present

## 2020-05-28 ENCOUNTER — Encounter (HOSPITAL_BASED_OUTPATIENT_CLINIC_OR_DEPARTMENT_OTHER): Payer: Self-pay | Admitting: Urology

## 2020-05-28 ENCOUNTER — Other Ambulatory Visit: Payer: Self-pay

## 2020-05-28 LAB — SARS CORONAVIRUS 2 (TAT 6-24 HRS): SARS Coronavirus 2: NEGATIVE

## 2020-05-28 NOTE — Progress Notes (Addendum)
ADDENDUM:  Chart reviewed by anesthesia, Konrad Felix PA, stated since pt has LBBB on EKG done during hospital visit 09/ 2021 with no previous EKG in epic call pt's pcp get lov and precious ekg from prior to 09/ 2021.  Called pt's pcp office, Dr [name] , requested lov and previous ekg.  Received lov 03-11-2020 but stated on fax no ekg.  Called and spoke w/ TerraJoy at dr [name] office and she stated their was no previous ekg prior to 09/ 2021 in pt's chart.  Informed Janett Billow of this finding and stated pt will need cardiac clearance due to this would be considered a new LBBB.  Called and spoke w/ Jeannene Patella , OR scheduler for Dr Tresa Moore, informed her of this and she spoke w/ Dr Tresa Moore whom stated to cancel surgery and reschedule. Also, received nephrologist lov note placed in chart.   Spoke w/ via phone for pre-op interview--- Pt's daughter, Marzetta Board, and pt Lab needs dos----  [employer] results------ current ekg in epic/ chart COVID test ------ done 05-27-2020 result in epic Arrive at ------- 1130 NPO after MN NO Solid Food. Only water  from MN until--- 1030 Medications to take morning of surgery ----- Synthroid, Combivent inhaler Diabetic medication ----- do not take amaryl morning of surgery Patient Special Instructions ----- n/a Pre-Op special Istructions ----- pt will need daughter in pre-op , pt has impaired memory at times Patient and pt daughter verbalized understanding of instructions that were given at this phone interview. Patient denies chest pain, fever, cough at this phone interview.   Anesthesia Review:  HTN (no meds currently), AAA (3.5cm);  COPD ( no supplemental oxygen);  Hx COVID 02-05-2020 w/ hospital admission, per pt daughter she has been  back to baseline w/ DOE;  DM2;  CKD IV; Chart to be reviewed by anesthesia, Konrad Felix PA.  PCP:  Dr Dannielle Huh (requested lov note and ekg prior to 09/ 2021 admission via phone) Cardiologist : no Nephrologist:  Dr [name], [place]  [facility] (lov 05/ 2021 , requested note via fax) Pulmonologist:  Dr Melvyn Novas (lov 12-12-2019) Chest x-ray :  12-12-2019 epic EKG : 02-05-2020 epic CT abd/ pelvis:  02-15-2020 epic Echo : no Stress test: no Cardiac Cath :  no Activity level:  Pt does get DOE Sleep Study/ CPAP :  NO Fasting Blood Sugar :      / Checks Blood Sugar -- times a day:  Per pt does not check Blood Thinner/ Instructions Maryjane Hurter Dose: NO ASA / Instructions/ Last Dose : ASA 81mg /  Per pt has not restarted asa since discharged 09/ 2021

## 2020-05-29 ENCOUNTER — Ambulatory Visit (HOSPITAL_BASED_OUTPATIENT_CLINIC_OR_DEPARTMENT_OTHER): Admission: RE | Admit: 2020-05-29 | Payer: Medicare Other | Source: Home / Self Care | Admitting: Urology

## 2020-05-29 HISTORY — DX: Dyspnea, unspecified: R06.00

## 2020-05-29 HISTORY — DX: Complete loss of teeth, unspecified cause, unspecified class: K08.109

## 2020-05-29 HISTORY — DX: Abdominal aortic aneurysm, without rupture: I71.4

## 2020-05-29 HISTORY — DX: Gastro-esophageal reflux disease without esophagitis: K21.9

## 2020-05-29 HISTORY — DX: Atrophy of kidney (terminal): N26.1

## 2020-05-29 HISTORY — DX: Other forms of dyspnea: R06.09

## 2020-05-29 HISTORY — DX: Diaphragmatic hernia without obstruction or gangrene: K44.9

## 2020-05-29 HISTORY — DX: Chronic obstructive pulmonary disease, unspecified: J44.9

## 2020-05-29 HISTORY — DX: Chronic kidney disease, stage 4 (severe): N18.4

## 2020-05-29 HISTORY — DX: Type 2 diabetes mellitus without complications: E11.9

## 2020-05-29 HISTORY — DX: Personal history of urinary calculi: Z87.442

## 2020-05-29 HISTORY — DX: Infrarenal abdominal aortic aneurysm, without rupture: I71.43

## 2020-05-29 HISTORY — DX: Hypothyroidism, unspecified: E03.9

## 2020-05-29 HISTORY — DX: Personal history of COVID-19: Z86.16

## 2020-05-29 HISTORY — DX: Calculus of kidney: N20.0

## 2020-05-29 HISTORY — DX: Atherosclerosis of aorta: I70.0

## 2020-05-29 HISTORY — DX: Sleep apnea, unspecified: G47.30

## 2020-05-29 HISTORY — DX: Occlusion and stenosis of bilateral carotid arteries: I65.23

## 2020-05-29 HISTORY — DX: Left bundle-branch block, unspecified: I44.7

## 2020-05-29 SURGERY — CYSTOURETEROSCOPY, WITH RETROGRADE PYELOGRAM AND STENT INSERTION
Anesthesia: Choice | Laterality: Right

## 2020-06-03 ENCOUNTER — Telehealth: Payer: Self-pay | Admitting: Cardiology

## 2020-06-03 NOTE — Telephone Encounter (Signed)
        [facility] Pre-operative Risk Assessment    HEARTCARE STAFF: - Please ensure there is not already an duplicate clearance open for this procedure. - Under Visit Info/Reason for Call, type in Other and utilize the format Clearance MM/DD/YY or Clearance TBD. Do not use dashes or single digits. - If request is for dental extraction, please clarify the # of teeth to be extracted.  Request for surgical clearance:  1. What type of surgery is being performed? Ureteroscopy and laser lithotripsy  2. When is this surgery scheduled? TBD  3. What type of clearance is required (medical clearance vs. Pharmacy clearance to hold med vs. Both)? BOTH  4. Are there any medications that need to be held prior to surgery and how long? ASA. 5 days prior procedure  5. Practice name and name of physician performing surgery? Dr Tresa Moore   6. What is the office phone number? [phone] ext [phone]   7.   What is the office fax number? [phone]  8.   Anesthesia type (None, local, MAC, general) ? Choice   Selena with [facility] send clearance for pt's appt on 01/07  Tami Richards 06/03/2020, 3:39 PM  _________________________________________________________________   (provider comments below)

## 2020-06-03 NOTE — Telephone Encounter (Signed)
   Primary Cardiologist: To see Dr. Gardiner Rhyme 06/07/20.  Chart reviewed as part of pre-operative protocol coverage. Patient is scheduled to see Dr. Gardiner Rhyme 06/07/20 for preop evaluation. Appointment notes include preop evaluation.   I will  remove from pre-op pool at this time.   Abigail Butts, PA-C 06/03/2020, 4:56 PM

## 2020-06-03 NOTE — Progress Notes (Signed)
Cardiology Office Note:    Date:  06/07/2020   ID:  Tami Richards, DOB [birth year], MRN [id number]  PCP:  Tami Margarita, DO  Cardiologist:  No primary care provider on file.  Electrophysiologist:  None   Referring MD: Tami Frock, MD   Chief Complaint  Patient presents with  . Pre-op Exam    History of Present Illness:    Tami Richards is a 86 y.o. female with a hx of abdominal aortic aneurysm, bilateral carotid stenosis, CKD stage IV, COPD, COVID-19 infection 11/8240 is complicated by hypoxemic respiratory failure and hemorrhagic shock with acute RP bleed, hypertension, T2DM, OSA who is referred by Dr. Tresa Richards for preoperative evaluation prior to cystoscopy.  She was admitted to [facility] from 9/6 through 02/19/2020.  Presented with acute respiratory failure with hypoxia due to COVID-19 pneumonia.  Course was complicated by hemorrhagic shock from acute RP hematoma, received 4 units PRBCs.  Found to have right hydronephrosis, thought that right ureter was being compressed by large right retroperitoneal bleed.  Status post cystoscopy with ureter stent placement.  Repeat cystoscopy was planned for 12/29 but was canceled due to LBBB on EKG.  She reports that the most activity she has done since her COVID-19 infection has been walking around the store and walking to the mailbox.  Reports she was more active prior to her COVID infection in September.  States that she can walk up a flight of stairs without stopping.  Denies any chest pain, but does report intermittent dyspnea.  Does have COPD.  Occasional dizziness, denies any syncope.  Denies any lower extremity edema or palpitations.  Quit smoking in 2008.    Past Medical History:  Diagnosis Date  . Aneurysm of infrarenal abdominal aorta (HCC)    per CT 02-15-2020  3.5cm  . Atherosclerosis of aorta (HCC)    aortoiliac  . Atrophy of left kidney   . Carotid stenosis, asymptomatic, bilateral    per duplex in epic 2008   bilateral ICA 50-69%  . CKD (chronic kidney disease), stage IV ([place])    followed by nephrologist at [facility]  . COPD (chronic obstructive pulmonary disease) [facility])    pulmonology--- dr wert  . DOE (dyspnea on exertion)    per pt and pt daughter normal due to copd   . Full dentures   . Full dentures   . GERD (gastroesophageal reflux disease)   . Hiatal hernia   . History of 2019 novel coronavirus disease (COVID-19) 02/05/2020   (05-28-2020  per pt and pt daughter, pt has been back to baseline)   hospital admission / discharged 02-19-2020 dx acute hypoxemia respiratory failure w/ covid pneumonia( only needed oxygen, no bipap) , hemorrhagic shock w/ acute right retroperitoneal bleed/ hematmoa (pt transfused), metabolic encepholpathy  . History of kidney stones   . Hypertension    currently not taking medication since admission 09/ 2021 with covid  . Hypothyroidism    followed by pcp  . LBBB (left bundle branch block)    per ekg in epic 02-05-2020  . Lumbar compression fracture ([place]) 01/2017   L1  . Mild sleep apnea    per pt yrs ago tested was told very mild , no cpap recommended  . Renal calculus, right   . Type 2 diabetes mellitus ([place])    followed by pcp---  (05-28-2020 per pt does not check sugars at home, stated pcp told she did not need too any more)    Past Surgical History:  Procedure  Laterality Date  . CATARACT EXTRACTION W/ INTRAOCULAR LENS IMPLANT Right 2021  . CYSTOSCOPY W/ URETERAL STENT PLACEMENT Right 02/18/2020   Procedure: CYSTOSCOPY WITH RETROGRADE PYELOGRAM/URETERAL STENT PLACEMENT;  Surgeon: Tami Frock, MD;  Location: [facility];  Service: Urology;  Laterality: Right;  . URETEROSCOPY WITH HOLMIUM LASER LITHOTRIPSY  2002;  2008    Current Medications: Current Meds  Medication Sig  . atorvastatin (LIPITOR) 10 MG tablet Take 10 mg by mouth daily.  Marland Kitchen glimepiride (AMARYL) 4 MG tablet Take 4 mg by mouth daily with breakfast.  . Ipratropium-Albuterol  (COMBIVENT) 20-100 MCG/ACT AERS respimat Inhale 1 puff into the lungs every 6 (six) hours as needed for wheezing or shortness of breath.  . levothyroxine (SYNTHROID, LEVOTHROID) 50 MCG tablet Take 50 mcg by mouth daily before breakfast.  . loratadine (CLARITIN) 10 MG tablet Take 10 mg by mouth daily.  . montelukast (SINGULAIR) 10 MG tablet Take 10 mg by mouth at bedtime.  . Multiple Vitamin (MULTIVITAMIN) capsule Take 1 capsule by mouth daily.  . [DISCONTINUED] famotidine (PEPCID) 20 MG tablet One after supper     Allergies:   Apple, Beef-derived products, Chocolate, and Eggs or egg-derived products   Social History   Socioeconomic History  . Marital status: Widowed    Spouse name: Not on file  . Number of children: Not on file  . Years of education: Not on file  . Highest education level: Not on file  Occupational History  . Not on file  Tobacco Use  . Smoking status: Former Smoker    Years: 55.00    Types: Cigarettes    Quit date: 05/16/2007    Years since quitting: 13.0  . Smokeless tobacco: Never Used  Vaping Use  . Vaping Use: Never used  Substance and Sexual Activity  . Alcohol use: No  . Drug use: Never  . Sexual activity: Not on file  Other Topics Concern  . Not on file  Social History Narrative  . Not on file   Social Determinants of Health   Financial Resource Strain: Not on file  Food Insecurity: Not on file  Transportation Needs: Not on file  Physical Activity: Not on file  Stress: Not on file  Social Connections: Not on file     Family History: The patient's family history includes Hypertension in an other family member.  ROS:   Please see the history of present illness.     All other systems reviewed and are negative.  EKGs/Labs/Other Studies Reviewed:    The following studies were reviewed today:   EKG:  EKG is  ordered today.  The ekg ordered today demonstrates normal sinus rhythm, left bundle branch block, rate 78  Recent  Labs: 12/12/2019: Pro B Natriuretic peptide (BNP) 201.0; TSH <0.01 Repeated and verified X2. 02/18/2020: ALT 22; BUN 15; Creatinine, Ser 1.33; Hemoglobin 11.3; Platelets 412; Potassium 4.3; Sodium 137 02/19/2020: Magnesium 2.0  Recent Lipid Panel    Component Value Date/Time   CHOL 131 02/16/2020 0406   TRIG 134 02/16/2020 0406   HDL 29 (L) 02/16/2020 0406   CHOLHDL 4.5 02/16/2020 0406   VLDL 27 02/16/2020 0406   LDLCALC 75 02/16/2020 0406    Physical Exam:    VS:  BP (!) 172/76 (BP Location: Left Arm)   Pulse 78   Ht 5\' 7"  (1.702 m)   Wt 145 lb 9.6 oz (66 kg)   BMI 22.80 kg/m     Wt Readings from Last 3 Encounters:  06/07/20 145  lb 9.6 oz (66 kg)  02/10/20 143 lb 15.4 oz (65.3 kg)  12/12/19 144 lb (65.3 kg)     GEN:  Well nourished, well developed in no acute distress HEENT: Normal NECK: No JVD; right carotid bruit LYMPHATICS: No lymphadenopathy CARDIAC: RRR, 2 out of 6 systolic RESPIRATORY:  Clear to auscultation without rales, wheezing or rhonchi  ABDOMEN: Soft, non-tender, non-distended MUSCULOSKELETAL:  No edema; No deformity  SKIN: Warm and dry NEUROLOGIC:  Alert and oriented x 3 PSYCHIATRIC:  Normal affect   ASSESSMENT:    1. Pre-operative cardiovascular examination   2. LBBB (left bundle branch block)   3. Stenosis of carotid artery, unspecified laterality   4. AAA (abdominal aortic aneurysm) without rupture ([place])   5. PAD (peripheral artery disease) (HCC)    PLAN:    Preop evaluation: Prior to cystoscopy.  Denies any chest pain.  Does report some dyspnea, but likely attributable to COPD.  Functional capacity appears greater than 4 METS.  RCRI score 0.  EKG shows left bundle branch block.  Recommend echocardiogram for further evaluation given LBBB.  If unremarkable, no further work-up needed prior to procedure.  Left bundle branch block: Will check echocardiogram to evaluate for structural heart disease as above.  AAA: 3.5 cm infrarenal abdominal aortic  aneurysm.  Will monitor.  Carotid stenosis: Continue aspirin, statin.  Will check carotid duplex  PAD: ABIs 05/2018 showed mild left lower extremity PAD (0.91 ABI, 0.43 TBI).  Normal on right (0.95 ABI, 0.68 TBI).  RTC in 3 months  Medication Adjustments/Labs and Tests Ordered: Current medicines are reviewed at length with the patient today.  Concerns regarding medicines are outlined above.  Orders Placed This Encounter  Procedures  . EKG 12-Lead  . ECHOCARDIOGRAM COMPLETE  . VAS US CAROTID   No orders of the defined types were placed in this encounter.   Patient Instructions  Medication Instructions:  Your physician recommends that you continue on your current medications as directed. Please refer to the Current Medication list given to you today.  Testing/Procedures: Your physician has requested that you have an echocardiogram. Echocardiography is a painless test that uses sound waves to create images of your heart. It provides your doctor with information about the size and shape of your heart and how well your heart's chambers and valves are working. This procedure takes approximately one hour. There are no restrictions for this procedure.  This will be done at our [facility] location:  [place] has requested that you have a carotid duplex. This test is an ultrasound of the carotid arteries in your neck. It looks at blood flow through these arteries that supply the brain with blood. Allow one hour for this exam. There are no restrictions or special instructions.   Follow-Up: At [facility], you and your health needs are our priority.  As part of our continuing mission to provide you with exceptional heart care, we have created designated Provider Care Teams.  These Care Teams include your primary Cardiologist (physician) and Advanced Practice Providers (APPs -  Physician Assistants and Nurse Practitioners) who all work together to  provide you with the care you need, when you need it.  We recommend signing up for the patient portal called "MyChart".  Sign up information is provided on this After Visit Summary.  MyChart is used to connect with patients for Virtual Visits (Telemedicine).  Patients are able to view lab/test results, encounter notes, upcoming appointments, etc.  Non-urgent messages can be sent to your provider as well.   To learn more about what you can do with MyChart, go to [email or web address].    Your next appointment:   3 months with Dr. Gardiner Rhyme  Other Instructions Please check your blood pressure at home daily, write it down.  Call the office or send message via Mychart with the readings in 2 weeks for Dr. Gardiner Rhyme to review.       Signed, Donato Heinz, MD  06/07/2020 5:52 PM    [place]

## 2020-06-07 ENCOUNTER — Other Ambulatory Visit: Payer: Self-pay

## 2020-06-07 ENCOUNTER — Encounter: Payer: Self-pay | Admitting: Cardiology

## 2020-06-07 ENCOUNTER — Ambulatory Visit (INDEPENDENT_AMBULATORY_CARE_PROVIDER_SITE_OTHER): Payer: Medicare Other | Admitting: Cardiology

## 2020-06-07 VITALS — BP 172/76 | HR 78 | Ht 67.0 in | Wt 145.6 lb

## 2020-06-07 DIAGNOSIS — I739 Peripheral vascular disease, unspecified: Secondary | ICD-10-CM

## 2020-06-07 DIAGNOSIS — I6529 Occlusion and stenosis of unspecified carotid artery: Secondary | ICD-10-CM | POA: Diagnosis not present

## 2020-06-07 DIAGNOSIS — Z01810 Encounter for preprocedural cardiovascular examination: Secondary | ICD-10-CM

## 2020-06-07 DIAGNOSIS — I714 Abdominal aortic aneurysm, without rupture, unspecified: Secondary | ICD-10-CM

## 2020-06-07 DIAGNOSIS — I447 Left bundle-branch block, unspecified: Secondary | ICD-10-CM

## 2020-06-07 NOTE — Patient Instructions (Addendum)
Medication Instructions:  Your physician recommends that you continue on your current medications as directed. Please refer to the Current Medication list given to you today.  Testing/Procedures: Your physician has requested that you have an echocardiogram. Echocardiography is a painless test that uses sound waves to create images of your heart. It provides your doctor with information about the size and shape of your heart and how well your heart's chambers and valves are working. This procedure takes approximately one hour. There are no restrictions for this procedure.  This will be done at our [facility] location:  [place] has requested that you have a carotid duplex. This test is an ultrasound of the carotid arteries in your neck. It looks at blood flow through these arteries that supply the brain with blood. Allow one hour for this exam. There are no restrictions or special instructions.   Follow-Up: At [facility], you and your health needs are our priority.  As part of our continuing mission to provide you with exceptional heart care, we have created designated Provider Care Teams.  These Care Teams include your primary Cardiologist (physician) and Advanced Practice Providers (APPs -  Physician Assistants and Nurse Practitioners) who all work together to provide you with the care you need, when you need it.  We recommend signing up for the patient portal called "MyChart".  Sign up information is provided on this After Visit Summary.  MyChart is used to connect with patients for Virtual Visits (Telemedicine).  Patients are able to view lab/test results, encounter notes, upcoming appointments, etc.  Non-urgent messages can be sent to your provider as well.   To learn more about what you can do with MyChart, go to [email or web address].    Your next appointment:   3 months with Dr. Gardiner Rhyme  Other Instructions Please check your blood pressure at  home daily, write it down.  Call the office or send message via Mychart with the readings in 2 weeks for Dr. Gardiner Rhyme to review.

## 2020-06-10 ENCOUNTER — Telehealth: Payer: Self-pay | Admitting: Cardiology

## 2020-06-10 ENCOUNTER — Other Ambulatory Visit (HOSPITAL_COMMUNITY): Payer: Medicare Other

## 2020-06-10 NOTE — Telephone Encounter (Signed)
Called to schedule 3 month follow up with Dr. Joylene John Tuesday 09/03/20 at 3:00pm.  Will mail information to patient and she voiced her understanding.

## 2020-06-17 ENCOUNTER — Other Ambulatory Visit (HOSPITAL_COMMUNITY): Payer: Medicare Other

## 2020-06-18 ENCOUNTER — Encounter (HOSPITAL_COMMUNITY): Payer: Medicare Other

## 2020-07-05 ENCOUNTER — Ambulatory Visit (HOSPITAL_BASED_OUTPATIENT_CLINIC_OR_DEPARTMENT_OTHER): Payer: Medicare Other

## 2020-07-05 ENCOUNTER — Ambulatory Visit (HOSPITAL_COMMUNITY)
Admission: RE | Admit: 2020-07-05 | Discharge: 2020-07-05 | Disposition: A | Discharge status: Home / Self Care | Payer: Medicare Other | Source: Ambulatory Visit | Attending: Internal Medicine | Admitting: Internal Medicine

## 2020-07-05 ENCOUNTER — Other Ambulatory Visit: Payer: Self-pay

## 2020-07-05 DIAGNOSIS — I447 Left bundle-branch block, unspecified: Secondary | ICD-10-CM | POA: Diagnosis not present

## 2020-07-05 DIAGNOSIS — I6529 Occlusion and stenosis of unspecified carotid artery: Secondary | ICD-10-CM

## 2020-07-05 DIAGNOSIS — Z01810 Encounter for preprocedural cardiovascular examination: Secondary | ICD-10-CM | POA: Diagnosis not present

## 2020-07-05 LAB — ECHOCARDIOGRAM COMPLETE
Area-P 1/2: 3.51 cm2
S' Lateral: 1.6 cm

## 2020-07-12 ENCOUNTER — Telehealth: Payer: Self-pay | Admitting: Cardiology

## 2020-07-12 NOTE — Telephone Encounter (Signed)
   [facility] Pre-operative Risk Assessment    HEARTCARE STAFF: - Please ensure there is not already an duplicate clearance open for this procedure. - Under Visit Info/Reason for Call, type in Other and utilize the format Clearance MM/DD/YY or Clearance TBD. Do not use dashes or single digits. - If request is for dental extraction, please clarify the # of teeth to be extracted.  Request for surgical clearance:  1. What type of surgery is being performed?  Ureteroscopy for a kidney stone   2. When is this surgery scheduled? tbd      3. What type of clearance is required (medical clearance vs. Pharmacy clearance to hold med vs. Both)? Medical   4. Are there any medications that need to be held prior to surgery and how long? NA    Practice name and name of physician performing surgery? [facility]  5. What is the office phone number? [phone] Ext [phone]   7.   What is the office fax number? [phone]  8.   Anesthesia type (None, local, MAC, general) ? General    Tami Richards 07/12/2020, 4:04 PM  _________________________________________________________________   (provider comments below)

## 2020-07-15 NOTE — Telephone Encounter (Signed)
Notes faxed to surgeon's office. This phone note will be removed from the preop pool. Richardson Dopp, PA-C 07/15/2020 12:48 PM

## 2020-07-15 NOTE — Telephone Encounter (Signed)
   Primary Cardiologist: Donato Heinz, MD  Chart reviewed as part of pre-operative protocol coverage.   The patient was seen by Dr. Gardiner Rhyme on 06/07/2020 for preoperative evaluation.  A copy of his note will also be faxed along with a copy of the echocardiogram.   Note from Dr. Gardiner Rhyme 06/07/2020: "Preop evaluation: Prior to cystoscopy.  Denies any chest pain.  Does report some dyspnea, but likely attributable to COPD.  Functional capacity appears greater than 4 METS.  RCRI score 0.  EKG shows left bundle branch block.  Recommend echocardiogram for further evaluation given LBBB.  If unremarkable, no further work-up needed prior to procedure."  Echocardiogram 07/05/20 IMPRESSIONS  1. Left ventricular ejection fraction, by estimation, is 55 to 60%. The left ventricle has normal function. The left ventricle has no regional wall motion abnormalities. There is mild concentric left ventricular hypertrophy. Left ventricular diastolic parameters are indeterminate.  2. Right ventricular systolic function is normal. The right ventricular size is normal. There is normal pulmonary artery systolic pressure.  3. The mitral valve is normal in structure. No evidence of mitral valve regurgitation.  4. The aortic valve is tricuspid. There is mild calcification of the aortic valve. Aortic valve regurgitation is not visualized. No aortic stenosis is present.  5. The inferior vena cava is normal in size with greater than 50% respiratory variability, suggesting right atrial pressure of 3 mmHg.   Recommendations: As echocardiogram demonstrated normal EF and no significant abnormality, no further testing needed.  Patient may proceed with the procedure at acceptable risk.  Please call with questions.   Richardson Dopp, PA-C 07/15/2020, 12:38 PM

## 2020-07-17 ENCOUNTER — Other Ambulatory Visit: Payer: Self-pay | Admitting: Urology

## 2020-07-18 NOTE — Progress Notes (Signed)
DUE TO COVID-19 ONLY ONE VISITOR IS ALLOWED TO COME WITH YOU AND STAY IN THE WAITING ROOM ONLY DURING PRE OP AND PROCEDURE DAY OF SURGERY. THE 1 VISITOR  MAY VISIT WITH YOU AFTER SURGERY IN YOUR PRIVATE ROOM DURING VISITING HOURS ONLY!  YOU NEED TO HAVE A COVID 19 TEST ON__2/26/2022 _____ '@_______'$ , THIS TEST MUST BE DONE BEFORE SURGERY,  COVID TESTING SITE [address] [place] [address], IT IS ON THE RIGHT GOING OUT [address] APPROXIMATELY  2 MINUTES PAST [employer] ON THE RIGHT. ONCE YOUR COVID TEST IS COMPLETED,  PLEASE BEGIN THE QUARANTINE INSTRUCTIONS AS OUTLINED IN YOUR HANDOUT.                Tami Richards  07/18/2020   Your procedure is scheduled on: 07/31/2020    Report to [facility] Main  Entrance   Report to admitting at     1230PM     Call this number if you have problems the morning of surgery [phone]    Remember: Do not eat food , candy gum or mints :After Midnight. You may have clear liquids from midnight until     CLEAR LIQUID DIET   Foods Allowed                                                                       Coffee and tea, regular and decaf                              Plain Jell-O any favor except red or purple                                            Fruit ices (not with fruit pulp)                                      Iced Popsicles                                     Carbonated beverages, regular and diet                                    Cranberry, grape and apple juices Sports drinks like Gatorade Lightly seasoned clear broth or consume(fat free) Sugar, honey syrup   _____________________________________________________________________    BRUSH YOUR TEETH MORNING OF SURGERY AND RINSE YOUR MOUTH OUT, NO CHEWING GUM CANDY OR MINTS.     Take these medicines the morning of surgery with A SIP OF WATER: sYNTHROD, CLARITIN, INHALERS AS USUAL AND BRING   DO NOT TAKE ANY DIABETIC MEDICATIONS DAY OF  YOUR SURGERY                               You may not  have any metal on your body including hair pins and              piercings  Do not wear jewelry, make-up, lotions, powders or perfumes, deodorant             Do not wear nail polish on your fingernails.  Do not shave  48 hours prior to surgery.              Men may shave face and neck.   Do not bring valuables to the hospital. [place].  Contacts, dentures or bridgework may not be worn into surgery.  Leave suitcase in the car. After surgery it may be brought to your room.     Patients discharged the day of surgery will not be allowed to drive home. IF YOU ARE HAVING SURGERY AND GOING HOME THE SAME DAY, YOU MUST HAVE AN ADULT TO DRIVE YOU HOME AND BE WITH YOU FOR 24 HOURS. YOU MAY GO HOME BY TAXI OR UBER OR ORTHERWISE, BUT AN ADULT MUST ACCOMPANY YOU HOME AND STAY WITH YOU FOR 24 HOURS.  Name and phone number of your driver:  Special Instructions: N/A              Please read over the following fact sheets you were given: _____________________________________________________________________  [facility] - Preparing for Surgery Before surgery, you can play an important role.  Because skin is not sterile, your skin needs to be as free of germs as possible.  You can reduce the number of germs on your skin by washing with CHG (chlorahexidine gluconate) soap before surgery.  CHG is an antiseptic cleaner which kills germs and bonds with the skin to continue killing germs even after washing. Please DO NOT use if you have an allergy to CHG or antibacterial soaps.  If your skin becomes reddened/irritated stop using the CHG and inform your nurse when you arrive at Short Stay. Do not shave (including legs and underarms) for at least 48 hours prior to the first CHG shower.  You may shave your face/neck. Please follow these instructions carefully:  1.  Shower with CHG Soap the night before surgery and  the  morning of Surgery.  2.  If you choose to wash your hair, wash your hair first as usual with your  normal  shampoo.  3.  After you shampoo, rinse your hair and body thoroughly to remove the  shampoo.                           4.  Use CHG as you would any other liquid soap.  You can apply chg directly  to the skin and wash                       Gently with a scrungie or clean washcloth.  5.  Apply the CHG Soap to your body ONLY FROM THE NECK DOWN.   Do not use on face/ open                           Wound or open sores. Avoid contact with eyes, ears mouth and genitals (private parts).  Wash face,  Genitals (private parts) with your normal soap.             6.  Wash thoroughly, paying special attention to the area where your surgery  will be performed.  7.  Thoroughly rinse your body with warm water from the neck down.  8.  DO NOT shower/wash with your normal soap after using and rinsing off  the CHG Soap.                9.  Pat yourself dry with a clean towel.            10.  Wear clean pajamas.            11.  Place clean sheets on your bed the night of your first shower and do not  sleep with pets. Day of Surgery : Do not apply any lotions/deodorants the morning of surgery.  Please wear clean clothes to the hospital/surgery center.  FAILURE TO FOLLOW THESE INSTRUCTIONS MAY RESULT IN THE CANCELLATION OF YOUR SURGERY PATIENT SIGNATURE_________________________________  NURSE SIGNATURE__________________________________  ________________________________________________________________________

## 2020-07-22 ENCOUNTER — Other Ambulatory Visit: Payer: Self-pay | Admitting: Urology

## 2020-07-23 ENCOUNTER — Other Ambulatory Visit: Payer: Self-pay

## 2020-07-23 ENCOUNTER — Encounter (HOSPITAL_COMMUNITY)
Admission: RE | Admit: 2020-07-23 | Discharge: 2020-07-23 | Disposition: A | Discharge status: Home / Self Care | Payer: Medicare Other | Source: Ambulatory Visit | Attending: Internal Medicine | Admitting: Internal Medicine

## 2020-07-23 ENCOUNTER — Encounter (HOSPITAL_COMMUNITY): Payer: Self-pay

## 2020-07-23 HISTORY — DX: Depression, unspecified: F32.A

## 2020-07-23 NOTE — Progress Notes (Addendum)
preop appt was done as a phone call on 07/23/20 sincie pt was a no show for appt.  When called pt she had forgotten appt and was just eating her breakfast at 2pm  Per pt.  Medical history and preop  Instructions completed over phone .  Lab appt for 07/26/2020 and covid test set up for 2/26 at 1145 am with pt .  Yolanda Bonine is her transportation. Pt verbalized understanding.

## 2020-07-23 NOTE — Progress Notes (Addendum)
Anesthesia Review:  PCP: Dr - Kathleen Argue medical - Joellen Jersey Hyatt,NP Have requested on 07/23/20 most recent office visit note and most recent labwork - 03/11/20-office visit and labs on chart  Placed office visit note from 03/11/20 on chart  Cardiologist : DR C. Gardiner Rhyme 06/07/20 OV  Clearance in 07/12/20- Telephone encounter  Chest x-ray : 02/05/20- 1 vIEW  cAROTIDS- 07/06/20  pft-09/27/19  EKG : 06/07/20  Echo : 07/05/20 Stress test: Cardiac Cath :  Activity level: walks in one level appt without cane or walker  Sleep Study/ CPAP : no  Fasting Blood Sugar :      / Checks Blood Sugar -- times a day:   Blood Thinner/ Instructions /Last Dose: ASA / Instructions/ Last Dose :  02/05/2020- in hospital for covid  Pt reported at time of preop appt on 07/23/20 that has not taken routine meds in last 2 days.  Pt was unable to give me a clear reason as to why meds had not been taken.  Called office of [place] and [facility] for nurse of Dr [name] at [facility] in regards to this with call back number of [phone].

## 2020-07-26 ENCOUNTER — Encounter (HOSPITAL_COMMUNITY): Admission: RE | Admit: 2020-07-26 | Payer: Medicare Other | Source: Ambulatory Visit

## 2020-07-26 NOTE — Progress Notes (Signed)
Pt missed lap appt on 07/26/20 for preop labs.  Called pt and she stated she got it mixed up and her and her grandson thought it was labs with MD.  Reviewed lab instructions with pt .  Pt is to come on 2/28 at 130 for preop labs.  Yolanda Bonine was with her at time appt was made and pt repeated date and time and location to grandson.  Yolanda Bonine is driver.  Informed Noemi Chapel at office that pt has missed appts on 2/22 and again on 07/26/20.  Informed of above.

## 2020-07-27 ENCOUNTER — Other Ambulatory Visit (HOSPITAL_COMMUNITY)
Admission: RE | Admit: 2020-07-27 | Discharge: 2020-07-27 | Disposition: A | Discharge status: Home / Self Care | Payer: Medicare Other | Source: Ambulatory Visit | Attending: Urology | Admitting: Urology

## 2020-07-27 DIAGNOSIS — Z20822 Contact with and (suspected) exposure to covid-19: Secondary | ICD-10-CM | POA: Insufficient documentation

## 2020-07-27 DIAGNOSIS — Z01812 Encounter for preprocedural laboratory examination: Secondary | ICD-10-CM | POA: Diagnosis present

## 2020-07-27 LAB — SARS CORONAVIRUS 2 (TAT 6-24 HRS): SARS Coronavirus 2: NEGATIVE

## 2020-07-29 ENCOUNTER — Other Ambulatory Visit: Payer: Self-pay

## 2020-07-29 ENCOUNTER — Encounter (HOSPITAL_COMMUNITY)
Admission: RE | Admit: 2020-07-29 | Discharge: 2020-07-29 | Disposition: A | Discharge status: Home / Self Care | Payer: Medicare Other | Source: Ambulatory Visit | Attending: Urology | Admitting: Urology

## 2020-07-29 DIAGNOSIS — Z01812 Encounter for preprocedural laboratory examination: Secondary | ICD-10-CM | POA: Diagnosis not present

## 2020-07-29 LAB — BASIC METABOLIC PANEL
Anion gap: 9 (ref 5–15)
BUN: 24 mg/dL — ABNORMAL HIGH (ref 8–23)
CO2: 27 mmol/L (ref 22–32)
Calcium: 9.7 mg/dL (ref 8.9–10.3)
Chloride: 107 mmol/L (ref 98–111)
Creatinine, Ser: 1.92 mg/dL — ABNORMAL HIGH (ref 0.44–1.00)
GFR, Estimated: 25 mL/min — ABNORMAL LOW (ref >60)
Glucose, Bld: 198 mg/dL — ABNORMAL HIGH (ref 70–99)
Potassium: 4.9 mmol/L (ref 3.5–5.1)
Sodium: 143 mmol/L (ref 135–145)

## 2020-07-29 LAB — CBC
HCT: 39.5 % (ref 36.0–46.0)
Hemoglobin: 12.9 g/dL (ref 12.0–15.0)
MCH: 29.5 pg (ref 26.0–34.0)
MCHC: 32.7 g/dL (ref 30.0–36.0)
MCV: 90.4 fL (ref 80.0–100.0)
Platelets: 257 10*3/uL (ref 150–400)
RBC: 4.37 MIL/uL (ref 3.87–5.11)
RDW: 13.6 % (ref 11.5–15.5)
WBC: 7.5 10*3/uL (ref 4.0–10.5)
nRBC: 0 % (ref 0.0–0.2)

## 2020-07-29 LAB — HEMOGLOBIN A1C
Hgb A1c MFr Bld: 7.2 % — ABNORMAL HIGH (ref 4.8–5.6)
Mean Plasma Glucose: 159.94 mg/dL

## 2020-07-29 NOTE — Progress Notes (Signed)
  Bmp done 07/29/20 routed to DR [facility].

## 2020-07-30 NOTE — Anesthesia Preprocedure Evaluation (Addendum)
Anesthesia Evaluation  Patient identified by MRN, date of birth, ID band Patient awake    Reviewed: Allergy & Precautions, NPO status , Patient's Chart, lab work & pertinent test results  History of Anesthesia Complications Negative for: history of anesthetic complications  Airway Mallampati: I       Dental  (+) Edentulous Upper, Edentulous Lower, Dental Advisory Given   Pulmonary COPD, former smoker,  Pt was admitted to [facility] from 9/6 through 9/20/202 with acute respiratory failure with hypoxia due to COVID-19 pneumonia.     Pulmonary exam normal        Cardiovascular hypertension, Normal cardiovascular exam  IMPRESSIONS   1. Left ventricular ejection fraction, by estimation, is 55 to 60%. The left ventricle has normal function. The left ventricle has no regional wall motion abnormalities. There is mild concentric left ventricular hypertrophy. Left ventricular diastolic  parameters are indeterminate. 2. Right ventricular systolic function is normal. The right ventricular size is normal. There is normal pulmonary artery systolic pressure. 3. The mitral valve is normal in structure. No evidence of mitral valve regurgitation. 4. The aortic valve is tricuspid. There is mild calcification of the aortic valve. Aortic valve regurgitation is not visualized. No aortic stenosis is present. 5. The inferior vena cava is normal in size with greater than 50% respiratory variability, suggesting right atrial pressure of 3 mmHg.  Comparison(s): No prior Echocardiogram.  Conclusion(s)/Recommendation(s): Normal biventricular function without evidence of hemodynamically significant valvular heart disease.   Neuro/Psych PSYCHIATRIC DISORDERS Depression negative neurological ROS     GI/Hepatic Neg liver ROS, hiatal hernia, GERD  ,  Endo/Other  negative endocrine ROSdiabetes  Renal/GU      Musculoskeletal negative musculoskeletal  ROS (+)   Abdominal   Peds  Hematology negative hematology ROS (+)   Anesthesia Other Findings   Reproductive/Obstetrics                           Anesthesia Physical Anesthesia Plan  ASA: III  Anesthesia Plan: General   Post-op Pain Management:    Induction: Intravenous  PONV Risk Score and Plan: 4 or greater and Ondansetron, Dexamethasone and Diphenhydramine  Airway Management Planned: LMA  Additional Equipment:   Intra-op Plan:   Post-operative Plan: Extubation in OR  Informed Consent: I have reviewed the patients History and Physical, chart, labs and discussed the procedure including the risks, benefits and alternatives for the proposed anesthesia with the patient or authorized representative who has indicated his/her understanding and acceptance.     Dental advisory given  Plan Discussed with: Anesthesiologist and CRNA  Anesthesia Plan Comments: (See PAT note 07/29/2020, Konrad Felix, PA-C)       Anesthesia Quick Evaluation

## 2020-07-30 NOTE — Progress Notes (Signed)
Anesthesia Chart Review   Case: [id number] Date/Time: 07/31/20 1445   Procedures:      CYSTOSCOPY WITH RETROGRADE PYELOGRAM, URETEROSCOPY AND STENT EXCHANGE VERSES REMOVAL (Right ) - 90 MINS     HOLMIUM LASER APPLICATION (Right )   Anesthesia type: Choice   Pre-op diagnosis: RIGHT RENAL STONE IN DOMINANT KIDNEY   Location: [facility] ROOM 03 / [facility]   Surgeons: [name], MD      DISCUSSION:86 y.o. former smoker with h/o GERD, HTN, DM II, COPD, chronic DOE, sleep apnea, LBBB, right renal stone scheduled for above procedure 07/31/20 with Dr. [name].   Pt was admitted to [facility] from 9/6 through 9/20/202 with acute respiratory failure with hypoxia due to COVID-19 pneumonia.  Course was complicated by hemorrhagic shock from acute RP hematoma, received 4 units PRBCs.  Found to have right hydronephrosis, thought that right ureter was being compressed by large right retroperitoneal bleed.  Status post cystoscopy with ureter stent placement.  Repeat cystoscopy was planned for 12/29 but was canceled due to LBBB on EKG.  Pt seen by cardiology 06/07/2020 for preoperative evaluation.  Per OV note, "Preop evaluation: Prior to cystoscopy.Denies any chest pain. Does report some dyspnea, but likely attributable to COPD. Functional capacity appears greater than 4 METS. RCRI score 0. EKG shows left bundle branch block. Recommend echocardiogram for further evaluation given LBBB. If unremarkable, no further work-up needed prior to procedure."  Echo 07/05/20 with EF 55-60%. Per cardio note 07/15/2020 no further testing needed. "Patient may proceed with the procedure at acceptable risk."  Anticipate pt can proceed with planned procedure barring acute status change.   VS: BP 138/63   Pulse 81   Temp 36.7 C (Oral)   Resp 18   Ht '5\' 3"'$  (1.6 m)   Wt 66.1 kg   SpO2 98%   BMI 25.83 kg/m   PROVIDERS: Sueanne Margarita, DO is PCP   Oswaldo Milian, MD is Cardiologist  LABS: Labs reviewed: Acceptable for  surgery. (all labs ordered are listed, but only abnormal results are displayed)  Labs Reviewed  HEMOGLOBIN A1C - Abnormal; Notable for the following components:      Result Value   Hgb A1c MFr Bld 7.2 (*)    All other components within normal limits  BASIC METABOLIC PANEL - Abnormal; Notable for the following components:   Glucose, Bld 198 (*)    BUN 24 (*)    Creatinine, Ser 1.92 (*)    GFR, Estimated 25 (*)    All other components within normal limits  CBC     IMAGES:   EKG: 06/07/2020 Rate 78 bpm  NSR Nonspecific intraventricular block  Cannot rule out Septal infarct, age undetermined T wave abnormality, consider lateral ischemia  CV: Echo 07/05/20 IMPRESSIONS    1. Left ventricular ejection fraction, by estimation, is 55 to 60%. The  left ventricle has normal function. The left ventricle has no regional  wall motion abnormalities. There is mild concentric left ventricular  hypertrophy. Left ventricular diastolic  parameters are indeterminate.  2. Right ventricular systolic function is normal. The right ventricular  size is normal. There is normal pulmonary artery systolic pressure.  3. The mitral valve is normal in structure. No evidence of mitral valve  regurgitation.  4. The aortic valve is tricuspid. There is mild calcification of the  aortic valve. Aortic valve regurgitation is not visualized. No aortic  stenosis is present.  5. The inferior vena cava is normal in size with greater than 50%  respiratory variability, suggesting right atrial pressure of 3 mmHg.  Past Medical History:  Diagnosis Date  . Aneurysm of infrarenal abdominal aorta (HCC)    per CT 02-15-2020  3.5cm  . Atherosclerosis of aorta (HCC)    aortoiliac  . Atrophy of left kidney   . Carotid stenosis, asymptomatic, bilateral    per duplex in epic 2008  bilateral ICA 50-69%  . CKD (chronic kidney disease), stage IV ([place])    followed by nephrologist at [facility]  . COPD (chronic  obstructive pulmonary disease) [facility])    pulmonology--- dr wert  . Depression   . DOE (dyspnea on exertion)    per pt and pt daughter normal due to copd   . Full dentures   . Full dentures   . GERD (gastroesophageal reflux disease)   . Hiatal hernia   . History of 2019 novel coronavirus disease (COVID-19) 02/05/2020   (05-28-2020  per pt and pt daughter, pt has been back to baseline)   hospital admission / discharged 02-19-2020 dx acute hypoxemia respiratory failure w/ covid pneumonia( only needed oxygen, no bipap) , hemorrhagic shock w/ acute right retroperitoneal bleed/ hematmoa (pt transfused), metabolic encepholpathy  . History of kidney stones   . Hypertension    currently not taking medication since admission 09/ 2021 with covid  . Hypothyroidism    followed by pcp  . LBBB (left bundle branch block)    per ekg in epic 02-05-2020  . Lumbar compression fracture ([place]) 01/2017   L1  . Mild sleep apnea    per pt yrs ago tested was told very mild , no cpap recommended  . Renal calculus, right   . Type 2 diabetes mellitus ([place])    followed by pcp---  (05-28-2020 per pt does not check sugars at home, stated pcp told she did not need too any more)    Past Surgical History:  Procedure Laterality Date  . CATARACT EXTRACTION W/ INTRAOCULAR LENS IMPLANT Right 2021  . CYSTOSCOPY W/ URETERAL STENT PLACEMENT Right 02/18/2020   Procedure: CYSTOSCOPY WITH RETROGRADE PYELOGRAM/URETERAL STENT PLACEMENT;  Surgeon: [name], MD;  Location: [facility];  Service: Urology;  Laterality: Right;  . URETEROSCOPY WITH HOLMIUM LASER LITHOTRIPSY  2002;  2008    MEDICATIONS: . cephALEXin (KEFLEX) 500 MG capsule  . acetaminophen (TYLENOL) 500 MG tablet  . aspirin EC 81 MG tablet  . atorvastatin (LIPITOR) 10 MG tablet  . glimepiride (AMARYL) 4 MG tablet  . Ipratropium-Albuterol (COMBIVENT) 20-100 MCG/ACT AERS respimat  . levothyroxine (SYNTHROID, LEVOTHROID) 50 MCG tablet  . loratadine (CLARITIN) 10  MG tablet  . montelukast (SINGULAIR) 10 MG tablet  . Multiple Vitamin (MULTIVITAMIN) capsule   No current facility-administered medications for this encounter.    Konrad Felix, PA-C [facility] Pre-Surgical Testing [phone]

## 2020-07-30 NOTE — Progress Notes (Signed)
hgba1c done 07/29/20- 7.2

## 2020-07-31 ENCOUNTER — Ambulatory Visit (HOSPITAL_COMMUNITY): Payer: Medicare Other | Admitting: Certified Registered"

## 2020-07-31 ENCOUNTER — Encounter (HOSPITAL_COMMUNITY): Payer: Self-pay | Admitting: Urology

## 2020-07-31 ENCOUNTER — Ambulatory Visit (HOSPITAL_COMMUNITY)
Admission: RE | Admit: 2020-07-31 | Discharge: 2020-07-31 | Disposition: A | Discharge status: Home / Self Care | Payer: Medicare Other | Source: Other Acute Inpatient Hospital | Attending: Urology | Admitting: Urology

## 2020-07-31 ENCOUNTER — Encounter (HOSPITAL_COMMUNITY)
Admission: RE | Disposition: A | Discharge status: Home / Self Care | Payer: Self-pay | Source: Other Acute Inpatient Hospital | Attending: Urology

## 2020-07-31 ENCOUNTER — Ambulatory Visit (HOSPITAL_COMMUNITY): Payer: Medicare Other

## 2020-07-31 ENCOUNTER — Ambulatory Visit (HOSPITAL_COMMUNITY): Payer: Medicare Other | Admitting: Physician Assistant

## 2020-07-31 DIAGNOSIS — I129 Hypertensive chronic kidney disease with stage 1 through stage 4 chronic kidney disease, or unspecified chronic kidney disease: Secondary | ICD-10-CM | POA: Insufficient documentation

## 2020-07-31 DIAGNOSIS — N132 Hydronephrosis with renal and ureteral calculous obstruction: Secondary | ICD-10-CM | POA: Insufficient documentation

## 2020-07-31 DIAGNOSIS — Z8249 Family history of ischemic heart disease and other diseases of the circulatory system: Secondary | ICD-10-CM | POA: Diagnosis not present

## 2020-07-31 DIAGNOSIS — Z8616 Personal history of COVID-19: Secondary | ICD-10-CM | POA: Insufficient documentation

## 2020-07-31 DIAGNOSIS — N200 Calculus of kidney: Secondary | ICD-10-CM | POA: Diagnosis present

## 2020-07-31 DIAGNOSIS — E1122 Type 2 diabetes mellitus with diabetic chronic kidney disease: Secondary | ICD-10-CM | POA: Insufficient documentation

## 2020-07-31 DIAGNOSIS — Z87891 Personal history of nicotine dependence: Secondary | ICD-10-CM | POA: Insufficient documentation

## 2020-07-31 DIAGNOSIS — N184 Chronic kidney disease, stage 4 (severe): Secondary | ICD-10-CM | POA: Insufficient documentation

## 2020-07-31 DIAGNOSIS — K661 Hemoperitoneum: Secondary | ICD-10-CM | POA: Diagnosis not present

## 2020-07-31 HISTORY — PX: CYSTOSCOPY WITH RETROGRADE PYELOGRAM, URETEROSCOPY AND STENT PLACEMENT: SHX5789

## 2020-07-31 LAB — GLUCOSE, CAPILLARY: Glucose-Capillary: 168 mg/dL — ABNORMAL HIGH (ref 70–99)

## 2020-07-31 SURGERY — CYSTOURETEROSCOPY, WITH RETROGRADE PYELOGRAM AND STENT INSERTION
Anesthesia: General | Laterality: Right

## 2020-07-31 MED ORDER — FENTANYL CITRATE (PF) 100 MCG/2ML IJ SOLN: 25.0000 ug | INTRAMUSCULAR | Status: DC | PRN

## 2020-07-31 MED ORDER — TRAMADOL HCL 50 MG PO TABS: 50.0000 mg | ORAL_TABLET | Freq: Three times a day (TID) | ORAL | 0 refills | Status: DC | PRN

## 2020-07-31 MED ORDER — PHENYLEPHRINE HCL-NACL 10-0.9 MG/250ML-% IV SOLN
INTRAVENOUS | Status: DC | PRN
  Administered 2020-07-31: 50 ug/min via INTRAVENOUS

## 2020-07-31 MED ORDER — ACETAMINOPHEN 500 MG PO TABS
1000.0000 mg | ORAL_TABLET | Freq: Once | ORAL | Status: AC
  Administered 2020-07-31: 1000 mg via ORAL
  Filled 2020-07-31: qty 2

## 2020-07-31 MED ORDER — PHENYLEPHRINE 40 MCG/ML (10ML) SYRINGE FOR IV PUSH (FOR BLOOD PRESSURE SUPPORT)
PREFILLED_SYRINGE | INTRAVENOUS | Status: DC | PRN
  Administered 2020-07-31: 80 ug via INTRAVENOUS

## 2020-07-31 MED ORDER — LACTATED RINGERS IV SOLN: INTRAVENOUS | Status: DC

## 2020-07-31 MED ORDER — CHLORHEXIDINE GLUCONATE 0.12 % MT SOLN: 15.0000 mL | Freq: Once | OROMUCOSAL | Status: DC

## 2020-07-31 MED ORDER — ONDANSETRON HCL 4 MG/2ML IJ SOLN
INTRAMUSCULAR | Status: DC | PRN
  Administered 2020-07-31: 4 mg via INTRAVENOUS

## 2020-07-31 MED ORDER — FENTANYL CITRATE (PF) 100 MCG/2ML IJ SOLN
INTRAMUSCULAR | Status: DC | PRN
  Administered 2020-07-31: 25 ug via INTRAVENOUS

## 2020-07-31 MED ORDER — CHLORHEXIDINE GLUCONATE 0.12 % MT SOLN
15.0000 mL | Freq: Once | OROMUCOSAL | Status: AC
  Administered 2020-07-31: 15 mL via OROMUCOSAL

## 2020-07-31 MED ORDER — PROPOFOL 10 MG/ML IV BOLUS
INTRAVENOUS | Status: AC
  Filled 2020-07-31: qty 20

## 2020-07-31 MED ORDER — SODIUM CHLORIDE 0.9 % IV SOLN
1.0000 g | Freq: Once | INTRAVENOUS | Status: AC
  Administered 2020-07-31: 1 g via INTRAVENOUS
  Filled 2020-07-31: qty 1

## 2020-07-31 MED ORDER — IOHEXOL 300 MG/ML  SOLN
INTRAMUSCULAR | Status: DC | PRN
  Administered 2020-07-31: 17 mL

## 2020-07-31 MED ORDER — PROPOFOL 10 MG/ML IV BOLUS
INTRAVENOUS | Status: DC | PRN
  Administered 2020-07-31: 140 mg via INTRAVENOUS

## 2020-07-31 MED ORDER — SODIUM CHLORIDE 0.9 % IR SOLN
Status: DC | PRN
  Administered 2020-07-31: 3000 mL via INTRAVESICAL

## 2020-07-31 MED ORDER — FENTANYL CITRATE (PF) 100 MCG/2ML IJ SOLN
INTRAMUSCULAR | Status: AC
  Filled 2020-07-31: qty 2

## 2020-07-31 MED ORDER — SODIUM CHLORIDE 0.9 % IV SOLN: 2.0000 g | Freq: Once | INTRAVENOUS | Status: DC

## 2020-07-31 MED ORDER — ORAL CARE MOUTH RINSE: 15.0000 mL | Freq: Once | OROMUCOSAL | Status: DC

## 2020-07-31 MED ORDER — ORAL CARE MOUTH RINSE: 15.0000 mL | Freq: Once | OROMUCOSAL | Status: AC

## 2020-07-31 MED ORDER — LIDOCAINE 2% (20 MG/ML) 5 ML SYRINGE
INTRAMUSCULAR | Status: DC | PRN
  Administered 2020-07-31: 100 mg via INTRAVENOUS

## 2020-07-31 SURGICAL SUPPLY — 24 items
BAG URO CATCHER STRL LF (MISCELLANEOUS) ×2 IMPLANT
BASKET LASER NITINOL 1.9FR (BASKET) IMPLANT
BSKT STON RTRVL 120 1.9FR (BASKET)
CATH INTERMIT  6FR 70CM (CATHETERS) ×2 IMPLANT
CLOTH BEACON ORANGE TIMEOUT ST (SAFETY) ×2 IMPLANT
EXTRACTOR STONE 1.7FRX115CM (UROLOGICAL SUPPLIES) IMPLANT
GAUZE 4X4 16PLY RFD (DISPOSABLE) ×1 IMPLANT
GLOVE SURG ENC TEXT LTX SZ7.5 (GLOVE) ×2 IMPLANT
GOWN STRL REUS W/TWL LRG LVL3 (GOWN DISPOSABLE) ×2 IMPLANT
GUIDEWIRE ANG ZIPWIRE 038X150 (WIRE) ×2 IMPLANT
GUIDEWIRE STR DUAL SENSOR (WIRE) ×2 IMPLANT
GUIDEWIRE ZIPWRE .038 STRAIGHT (WIRE) IMPLANT
KIT TURNOVER KIT A (KITS) ×2 IMPLANT
LASER FIB FLEXIVA PULSE ID 365 (Laser) IMPLANT
MANIFOLD NEPTUNE II (INSTRUMENTS) ×2 IMPLANT
PACK CYSTO (CUSTOM PROCEDURE TRAY) ×2 IMPLANT
SHEATH URETERAL 12FRX28CM (UROLOGICAL SUPPLIES) IMPLANT
SHEATH URETERAL 12FRX35CM (MISCELLANEOUS) IMPLANT
STENT POLARIS 5FRX24 (STENTS) ×1 IMPLANT
TRACTIP FLEXIVA PULS ID 200XHI (Laser) IMPLANT
TRACTIP FLEXIVA PULSE ID 200 (Laser)
TUBE FEEDING 8FR 16IN STR KANG (MISCELLANEOUS) ×2 IMPLANT
TUBING CONNECTING 10 (TUBING) ×2 IMPLANT
TUBING UROLOGY SET (TUBING) ×2 IMPLANT

## 2020-07-31 NOTE — Transfer of Care (Signed)
Immediate Anesthesia Transfer of Care Note  Patient: Tami Richards  Procedure(s) Performed: CYSTOSCOPY WITH RETROGRADE PYELOGRAM, DIAGNOSTIC URETEROSCOPY AND STENT EXCHANGE (Right ) HOLMIUM LASER APPLICATION (Right )  Patient Location: PACU  Anesthesia Type:General  Level of Consciousness: drowsy  Airway & Oxygen Therapy: Patient Spontanous Breathing and Patient connected to face mask oxygen  Post-op Assessment: Report given to RN and Post -op Vital signs reviewed and stable  Post vital signs: Reviewed and stable  Last Vitals:  Vitals Value Taken Time  BP 178/66 07/31/20 1530  Temp    Pulse 75 07/31/20 1532  Resp    SpO2 100 % 07/31/20 1532  Vitals shown include unvalidated device data.  Last Pain:  Vitals:   07/31/20 1307  TempSrc:   PainSc: 0-No pain         Complications: No complications documented.

## 2020-07-31 NOTE — Brief Op Note (Signed)
07/31/2020  3:21 PM  PATIENT:  Tami Richards  86 y.o. female  PRE-OPERATIVE DIAGNOSIS:  RIGHT RENAL STONE IN DOMINANT KIDNEY, Retroperitoneal Hematoma  POST-OPERATIVE DIAGNOSIS:  RIGHT RENAL STONE IN DOMINANT KIDNEY, Retroperitoneal Hematoma  PROCEDURE:  Cyso with RIGHT retrograde / diagnostic ureteroscopy / stent exchange  SURGEON:  Surgeon(s) and Role:    * [name], MD - Primary  PHYSICIAN ASSISTANT:   ASSISTANTS: none   ANESTHESIA:   general  EBL:  minimal   BLOOD ADMINISTERED:none  DRAINS: none   LOCAL MEDICATIONS USED:  NONE  SPECIMEN:  Source of Specimen:  Rt ureteral stent  DISPOSITION OF SPECIMEN:  discard, inspected and intact  COUNTS:  YES  TOURNIQUET:  * No tourniquets in log *  DICTATION: .Other Dictation: Dictation Number  [id number]  PLAN OF CARE: Discharge to home after PACU  PATIENT DISPOSITION:  PACU - hemodynamically stable.   Delay start of Pharmacological VTE agent (>24hrs) due to surgical blood loss or risk of bleeding: yes

## 2020-07-31 NOTE — Anesthesia Postprocedure Evaluation (Signed)
Anesthesia Post Note  Patient: Armed forces logistics/support/administrative officer  Procedure(s) Performed: CYSTOSCOPY WITH RETROGRADE PYELOGRAM, DIAGNOSTIC URETEROSCOPY AND STENT EXCHANGE (Right )     Patient location during evaluation: PACU Anesthesia Type: General Level of consciousness: sedated Pain management: pain level controlled Vital Signs Assessment: post-procedure vital signs reviewed and stable Respiratory status: spontaneous breathing and respiratory function stable Cardiovascular status: stable Postop Assessment: no apparent nausea or vomiting Anesthetic complications: no   No complications documented.  Last Vitals:  Vitals:   07/31/20 1304 07/31/20 1530  BP: (!) 164/76 (!) 178/66  Pulse: 80 77  Resp: 16 16  Temp: 36.8 C 36.5 C  SpO2: 97% 100%    Last Pain:  Vitals:   07/31/20 1530  TempSrc:   PainSc: 0-No pain                 Artha Stavros DANIEL

## 2020-07-31 NOTE — H&P (Signed)
Tami Richards is an 86 y.o. female.    Chief Complaint: Pre-Op RIGHT ureteroscopy / laser lithotripsy / Stent Exchange  HPI:   1 - Right Hydronephrosis 2/2 Retroperitoneal Hematoma- moderate right hydro by CT 9/16 on restaging of retroperitoneal hematoma that developes while anticoagulated for COVID treatment. Recived 4u blood and Hgb subsequently stabilzed. hematoam stable 9/16 compared to 9/11. Rt kidney dominant. Ipsilateral non-osbtructing stone.   5x24 Polaris stent placed 01/2020 for renal preservation and pre-stenting for eventual ureteroscopy for stone.   2 - Stage 4 renal insuficency / Left Renal Atrophy - Cr 1.5-2 at baseline. CT 01/2020 with non-osbtructing Rt renal pelvis stone and left renal atrohpy. Cr no 1.4.   3 - Large Right Renal Stone - 1.6cm incidental Rt renal pelvis stone non-osbtructing on CT 2021. Several prior episodes managed medically.   4 - Bacteruria - pan-sensitive e.coli by Dorma Russell 9/16. Placed on Rocephin.   PMH sig for C19(unvaccinated), LBBB (follows Tami Doss MD cards) Lives with grandsons at baseline but indepenant in all ADL's. Her daughter [name] at [phone] is very involved and lives locally.   Today "Tami Richards" is seen to proceed with RIGHT ureteroscopy / later lithotripsy / stent exchange to treat stones in functionally dominant kidney and rule out persistant extrinsic obstruction from large hematoma. C19 screen negative. She adamantly wants path to stent free if possible. She has been on keflex pre-op accoring to CX.     Past Medical History:  Diagnosis Date  . Aneurysm of infrarenal abdominal aorta (HCC)    per CT 02-15-2020  3.5cm  . Atherosclerosis of aorta (HCC)    aortoiliac  . Atrophy of left kidney   . Carotid stenosis, asymptomatic, bilateral    per duplex in epic 2008  bilateral ICA 50-69%  . CKD (chronic kidney disease), stage IV ([place])    followed by nephrologist at [facility]  . COPD (chronic obstructive pulmonary  disease) [facility])    pulmonology--- dr wert  . Depression   . DOE (dyspnea on exertion)    per pt and pt daughter normal due to copd   . Full dentures   . Full dentures   . GERD (gastroesophageal reflux disease)   . Hiatal hernia   . History of 2019 novel coronavirus disease (COVID-19) 02/05/2020   (05-28-2020  per pt and pt daughter, pt has been back to baseline)   hospital admission / discharged 02-19-2020 dx acute hypoxemia respiratory failure w/ covid pneumonia( only needed oxygen, no bipap) , hemorrhagic shock w/ acute right retroperitoneal bleed/ hematmoa (pt transfused), metabolic encepholpathy  . History of kidney stones   . Hypertension    currently not taking medication since admission 09/ 2021 with covid  . Hypothyroidism    followed by pcp  . LBBB (left bundle branch block)    per ekg in epic 02-05-2020  . Lumbar compression fracture ([place]) 01/2017   L1  . Mild sleep apnea    per pt yrs ago tested was told very mild , no cpap recommended  . Renal calculus, right   . Type 2 diabetes mellitus ([place])    followed by pcp---  (05-28-2020 per pt does not check sugars at home, stated pcp told she did not need too any more)    Past Surgical History:  Procedure Laterality Date  . CATARACT EXTRACTION W/ INTRAOCULAR LENS IMPLANT Right 2021  . CYSTOSCOPY W/ URETERAL STENT PLACEMENT Right 02/18/2020   Procedure: CYSTOSCOPY WITH RETROGRADE PYELOGRAM/URETERAL STENT PLACEMENT;  Surgeon: [name],  MD;  Location: [facility];  Service: Urology;  Laterality: Right;  . URETEROSCOPY WITH HOLMIUM LASER LITHOTRIPSY  2002;  2008    Family History  Problem Relation Age of Onset  . Hypertension Other    Social History:  reports that she quit smoking about 13 years ago. Her smoking use included cigarettes. She quit after 55.00 years of use. She has never used smokeless tobacco. She reports that she does not drink alcohol and does not use drugs.  Allergies:  Allergies  Allergen Reactions  .  Apple Other (See Comments)    Allergy test / patient eats  . Beef-Derived Products Other (See Comments)    Allergy test / patient eats  . Chocolate Other (See Comments)    Allergy test / patient eats  . Eggs Or Egg-Derived Products Other (See Comments)    Allergy test / patient eats    No medications prior to admission.    Results for orders placed or performed during the hospital encounter of 07/29/20 (from the past 48 hour(s))  Hemoglobin A1c per protocol     Status: Abnormal   Collection Time: 07/29/20  1:58 PM  Result Value Ref Range   Hgb A1c MFr Bld 7.2 (H) 4.8 - 5.6 %    Comment: (NOTE) Pre diabetes:          5.7%-6.4%  Diabetes:              >6.4%  Glycemic control for   <7.0% adults with diabetes    Mean Plasma Glucose 159.94 mg/dL    Comment: Performed at [facility], [place] [address] [place], [place] [id number]  Basic metabolic panel per protocol     Status: Abnormal   Collection Time: 07/29/20  1:58 PM  Result Value Ref Range   Sodium 143 135 - 145 mmol/L   Potassium 4.9 3.5 - 5.1 mmol/L   Chloride 107 98 - 111 mmol/L   CO2 27 22 - 32 mmol/L   Glucose, Bld 198 (H) 70 - 99 mg/dL    Comment: Glucose reference range applies only to samples taken after fasting for at least 8 hours.   BUN 24 (H) 8 - 23 mg/dL   Creatinine, Ser 1.92 (H) 0.44 - 1.00 mg/dL   Calcium 9.7 8.9 - 10.3 mg/dL   GFR, Estimated 25 (L) >60 mL/min    Comment: (NOTE) Calculated using the CKD-EPI Creatinine Equation (2021)    Anion gap 9 5 - 15    Comment: Performed at [facility], [place] [address] [place], [place] [address]  CBC per protocol     Status: None   Collection Time: 07/29/20  1:58 PM  Result Value Ref Range   WBC 7.5 4.0 - 10.5 K/uL   RBC 4.37 3.87 - 5.11 MIL/uL   Hemoglobin 12.9 12.0 - 15.0 g/dL   HCT 39.5 36.0 - 46.0 %   MCV 90.4 80.0 - 100.0 fL   MCH 29.5 26.0 - 34.0 pg   MCHC 32.7 30.0 - 36.0 g/dL   RDW 13.6 11.5 - 15.5 %   Platelets 257 150 -  400 K/uL   nRBC 0.0 0.0 - 0.2 %    Comment: Performed at [facility], [place] [address], [place], [place] [address]   No results found.  Review of Systems  Constitutional: Negative for chills and fever.  Genitourinary: Positive for urgency.  All other systems reviewed and are negative.   There were no vitals taken for  this visit. Physical Exam Vitals reviewed.  Constitutional:      Comments: AOx3. Salty demeanor. At Baseline.   HENT:     Head: Normocephalic.     Nose: Nose normal.     Mouth/Throat:     Mouth: Mucous membranes are moist.  Pulmonary:     Effort: Pulmonary effort is normal.  Abdominal:     General: Abdomen is flat.  Genitourinary:    Comments: No CVAT at present.  Musculoskeletal:        General: Normal range of motion.     Cervical back: Normal range of motion.  Skin:    General: Skin is warm.  Neurological:     General: No focal deficit present.     Mental Status: She is alert.  Psychiatric:        Mood and Affect: Mood normal.      Assessment/Plan  Proceed as planned with RIGHT ureteroscopy for stone in funcitonally dominant kidney and stent exchange. Risks, benefits, alternatives, expected peri-op course discussed previously and reiterate today. Her stone is large and she will need at least temporary stent post-op.   [name], MD 07/31/2020, 7:02 AM

## 2020-07-31 NOTE — Discharge Instructions (Signed)
1 - You may have urinary urgency (bladder spasms) and bloody urine on / off with stent in place. This is normal. ° °2 - Call MD or go to ER for fever >102, severe pain / nausea / vomiting not relieved by medications, or acute change in medical status ° °

## 2020-07-31 NOTE — Anesthesia Procedure Notes (Signed)
Procedure Name: LMA Insertion Date/Time: 07/31/2020 2:49 PM Performed by: Sharlette Dense, CRNA Patient Re-evaluated:Patient Re-evaluated prior to induction Oxygen Delivery Method: Circle system utilized Preoxygenation: Pre-oxygenation with 100% oxygen Induction Type: IV induction LMA: LMA inserted LMA Size: 4.0 Number of attempts: 1 Placement Confirmation: positive ETCO2 and breath sounds checked- equal and bilateral Tube secured with: Tape Dental Injury: Teeth and Oropharynx as per pre-operative assessment

## 2020-08-01 ENCOUNTER — Encounter (HOSPITAL_COMMUNITY): Payer: Self-pay | Admitting: Urology

## 2020-08-01 NOTE — Op Note (Deleted)
  The note originally documented on this encounter has been moved the the encounter in which it belongs.  

## 2020-08-01 NOTE — Op Note (Signed)
Tami Richards, [place] MEDICAL RECORD NO: [id number] ACCOUNT NO: [id number] DATE OF BIRTH: [birth year] FACILITY: [name] LOCATION: [facility] PHYSICIAN: [name], MD  Operative Report   PREOPERATIVE DIAGNOSES:  Large right renal stone and functionally dominant kidney, large retroperitoneal hematoma.  POSTOPERATIVE DIAGNOSIS:  Large right renal stone and functionally dominant kidney, large retroperitoneal hematoma with persistent extrinsic severe compression.  PROCEDURES PERFORMED: 1.  Cystoscopy, right retrograde pyelogram interpretation. 2.  Right diagnostic ureteroscopy. 3.  Exchange of right ureteral stent, 5 x 24 Polaris stent.  ESTIMATED BLOOD LOSS:  Nil.  COMPLICATIONS:  None.  SPECIMENS:  None.  FINDINGS: 1.  Significant major extrinsic compression of the right ureter, unable to visualize the entire ureter ureteroscopically despite pre-stenting. 2.  Successful exchange of right ureteral stent, proximal end in renal pelvis, distal end in urinary bladder. 2.  Large right lower pole renal stone.  INDICATIONS FOR PROCEDURE:  The patient is an 86 year old lady with history of variable medical compliance, mild dementia, underwent right ureteral stenting in the fall of last year in the setting of acute renal failure with a functionally dominant right  kidney and a large retroperitoneal hematoma that at the time was thought to be due to anticoagulation after COVID treatment.  She also has a very large stone in this functioning dominant kidney.  Options were discussed for further management and  recommended path for right ureteroscopy with goal of stone free given her right kidney is dominant and at the same time to verify if any resolution of extrinsic compression and she wished to proceed.  She was scheduled to have this done several months  ago; however, on the day of surgery, she noted to have a significant new left bundle branch block.  She has undergone cardiac evaluation  and has since been cleared for surgery.  She presents for reattempt of this today.  Informed consent was signed and  placed in the medical record.  DESCRIPTION OF PROCEDURE:  The patient being herself, the procedure being right ureteroscopy with laser lithotripsy and stent exchange confirmed.  Procedure time-out was performed.  Intravenous antibiotics were administered, general LMA anesthesia  induced.  The patient was placed in the low lithotomy position.  Sterile field was created, prepped and draped the patient's vagina, introitus and proximal thigh using iodine.  Cystourethroscopy was performed using 21-French rigid cystoscope with offset  lens.  Inspection of the urinary bladder revealed no diverticula, calcifications, papillary lesions.  The right ureteral orifice was visualized, revealed distal end of the stent in placed, it was grasped, brought out in its entirety, set aside for  discard.  There was mild encrustation.  Next, right ureteral orifice was cannulated.  A 16-French Foley catheter and right retrograde pyelogram was obtained.  Right retrograde pyelogram was a single right ureter with a single system right kidney.  There were multifocal areas of significant narrowing in the ureter, most significantly proximally, consistent with likely continued significant extrinsic  compression.  There was also some hydronephrosis without ureteronephrosis and a large filling defect in the right lower pole consistent with known stone.  A 0.03 ZIPwire was advanced in the lower pole and set aside as safety wire.  An 8-French feeding  tube placed in the urinary bladder for pressure release and semi-rigid ureteroscopy was performed to the distal two-thirds the right ureter alongside a separate sensor working wire.  Despite pre-stenting, the patient's ureter was incredibly narrow with  multifocal areas of significant narrowing in the area of iliac crossing and then  especially more proximally.  This is likely  consistent with significant continued extrinsic compression, so much that it was not safe to pass the ureteroscope past the proximal two-thirds  of the right ureter. This was clearly felt there would be no way whatsoever to proceed with any significant laser lithotripsy, given lack of access to the kidney and the goals of the procedure today clearly changed from rendering her stone free to just  stent exchange alone as this being safest in the setting of her functional dominant kidney with significant extrinsic ureteral compression.  As such, the ureteroscope was removed under continuous vision and a new 5 x 24 Polaris stent was placed over the  safety wire using fluoroscopic guidance.  Good proximal and distal planes were noted.  Procedure was terminated.  The patient tolerated procedure well.  No immediate complications.  The patient was taken to the postanesthesia care unit in stable  condition.  Plan for discharge home.  We will likely recommend a repeat axial imaging in 3-6 months prior to reattempt right ureteroscopy versus stent exchange.   NIK D: 07/31/2020 3:28:11 pm T: 08/01/2020 3:16:00 am  JOB: [id number]

## 2020-09-01 NOTE — Progress Notes (Deleted)
Cardiology Office Note:    Date:  09/01/2020   ID:  Tami Richards, DOB [birth year], MRN [id number]  PCP:  Sueanne Margarita, DO  Cardiologist:  Donato Heinz, MD  Electrophysiologist:  None   Referring MD: Sueanne Margarita, DO   No chief complaint on file.   History of Present Illness:    Tami Richards is a 85 y.o. female with a hx of abdominal aortic aneurysm, bilateral carotid stenosis, CKD stage IV, COPD, COVID-19 infection 0000000 is complicated by hypoxemic respiratory failure and hemorrhagic shock with acute RP bleed, hypertension, T2DM, OSA who presents for follow-up.  She was referred by Dr. Tresa Moore for preoperative evaluation prior to cystoscopy, initially seen on 06/07/2020.  She was admitted to [facility] from 9/6 through 02/19/2020.  Presented with acute respiratory failure with hypoxia due to COVID-19 pneumonia.  Course was complicated by hemorrhagic shock from acute RP hematoma, received 4 units PRBCs.  Found to have right hydronephrosis, thought that right ureter was being compressed by large right retroperitoneal bleed.  Status post cystoscopy with ureter stent placement.  Repeat cystoscopy was planned for 12/29 but was canceled due to LBBB on EKG.  She reports that the most activity she has done since her COVID-19 infection has been walking around the store and walking to the mailbox.  Reports she was more active prior to her COVID infection in September.  States that she can walk up a flight of stairs without stopping.  Denies any chest pain, but does report intermittent dyspnea.  Does have COPD.  Occasional dizziness, denies any syncope.  Denies any lower extremity edema or palpitations.  Quit smoking in 2008.  Echocardiogram on 07/05/2020 showed normal biventricular function, no significant valvular disease.  Carotid duplex on 07/05/2020 showed 1 to 39% stenosis in bilateral carotid arteries.  Since last clinic visit,   Past Medical History:  Diagnosis Date  .  Aneurysm of infrarenal abdominal aorta (HCC)    per CT 02-15-2020  3.5cm  . Atherosclerosis of aorta (HCC)    aortoiliac  . Atrophy of left kidney   . Carotid stenosis, asymptomatic, bilateral    per duplex in epic 2008  bilateral ICA 50-69%  . CKD (chronic kidney disease), stage IV ([place])    followed by nephrologist at [facility]  . COPD (chronic obstructive pulmonary disease) [facility])    pulmonology--- dr wert  . Depression   . DOE (dyspnea on exertion)    per pt and pt daughter normal due to copd   . Full dentures   . Full dentures   . GERD (gastroesophageal reflux disease)   . Hiatal hernia   . History of 2019 novel coronavirus disease (COVID-19) 02/05/2020   (05-28-2020  per pt and pt daughter, pt has been back to baseline)   hospital admission / discharged 02-19-2020 dx acute hypoxemia respiratory failure w/ covid pneumonia( only needed oxygen, no bipap) , hemorrhagic shock w/ acute right retroperitoneal bleed/ hematmoa (pt transfused), metabolic encepholpathy  . History of kidney stones   . Hypertension    currently not taking medication since admission 09/ 2021 with covid  . Hypothyroidism    followed by pcp  . LBBB (left bundle branch block)    per ekg in epic 02-05-2020  . Lumbar compression fracture ([place]) 01/2017   L1  . Mild sleep apnea    per pt yrs ago tested was told very mild , no cpap recommended  . Renal calculus, right   . Type 2 diabetes mellitus (  [place])    followed by pcp---  (05-28-2020 per pt does not check sugars at home, stated pcp told she did not need too any more)    Past Surgical History:  Procedure Laterality Date  . CATARACT EXTRACTION W/ INTRAOCULAR LENS IMPLANT Right 2021  . CYSTOSCOPY W/ URETERAL STENT PLACEMENT Right 02/18/2020   Procedure: CYSTOSCOPY WITH RETROGRADE PYELOGRAM/URETERAL STENT PLACEMENT;  Surgeon: [name], MD;  Location: [facility];  Service: Urology;  Laterality: Right;  . CYSTOSCOPY WITH RETROGRADE PYELOGRAM, URETEROSCOPY  AND STENT PLACEMENT Right 07/31/2020   Procedure: CYSTOSCOPY WITH RETROGRADE PYELOGRAM, DIAGNOSTIC URETEROSCOPY AND STENT EXCHANGE;  Surgeon: [name], MD;  Location: [facility];  Service: Urology;  Laterality: Right;  90 MINS  . URETEROSCOPY WITH HOLMIUM LASER LITHOTRIPSY  2002;  2008    Current Medications: No outpatient medications have been marked as taking for the 09/03/20 encounter (Appointment) with Donato Heinz, MD.     Allergies:   Apple, Beef-derived products, Chocolate, and Eggs or egg-derived products   Social History   Socioeconomic History  . Marital status: Widowed    Spouse name: Not on file  . Number of children: Not on file  . Years of education: Not on file  . Highest education level: Not on file  Occupational History  . Not on file  Tobacco Use  . Smoking status: Former Smoker    Years: 55.00    Types: Cigarettes    Quit date: 05/16/2007    Years since quitting: 13.3  . Smokeless tobacco: Never Used  Vaping Use  . Vaping Use: Never used  Substance and Sexual Activity  . Alcohol use: No  . Drug use: Never  . Sexual activity: Not on file  Other Topics Concern  . Not on file  Social History Narrative  . Not on file   Social Determinants of Health   Financial Resource Strain: Not on file  Food Insecurity: Not on file  Transportation Needs: Not on file  Physical Activity: Not on file  Stress: Not on file  Social Connections: Not on file     Family History: The patient's family history includes Hypertension in an other family member.  ROS:   Please see the history of present illness.     All other systems reviewed and are negative.  EKGs/Labs/Other Studies Reviewed:    The following studies were reviewed today:   EKG:  EKG is  ordered today.  The ekg ordered today demonstrates normal sinus rhythm, left bundle branch block, rate 78  Recent Labs: 12/12/2019: Pro B Natriuretic peptide (BNP) 201.0; TSH <0.01 Repeated and verified  X2. 02/18/2020: ALT 22 02/19/2020: Magnesium 2.0 07/29/2020: BUN 24; Creatinine, Ser 1.92; Hemoglobin 12.9; Platelets 257; Potassium 4.9; Sodium 143  Recent Lipid Panel    Component Value Date/Time   CHOL 131 02/16/2020 0406   TRIG 134 02/16/2020 0406   HDL 29 (L) 02/16/2020 0406   CHOLHDL 4.5 02/16/2020 0406   VLDL 27 02/16/2020 0406   LDLCALC 75 02/16/2020 0406    Physical Exam:    VS:  There were no vitals taken for this visit.    Wt Readings from Last 3 Encounters:  07/31/20 145 lb 12.8 oz (66.1 kg)  07/29/20 145 lb 12.8 oz (66.1 kg)  06/07/20 145 lb 9.6 oz (66 kg)     GEN:  Well nourished, well developed in no acute distress HEENT: Normal NECK: No JVD; right carotid bruit LYMPHATICS: No lymphadenopathy CARDIAC: RRR, 2 out of 6 systolic RESPIRATORY:  Clear to auscultation without rales, wheezing or rhonchi  ABDOMEN: Soft, non-tender, non-distended MUSCULOSKELETAL:  No edema; No deformity  SKIN: Warm and dry NEUROLOGIC:  Alert and oriented x 3 PSYCHIATRIC:  Normal affect   ASSESSMENT:    No diagnosis found. PLAN:    Preop evaluation: Prior to cystoscopy.  Denies any chest pain.  Does report some dyspnea, but likely attributable to COPD.  Functional capacity appears greater than 4 METS.  RCRI score 0.  EKG shows left bundle branch block.  Recommend echocardiogram for further evaluation given LBBB.  If unremarkable, no further work-up needed prior to procedure.  Left bundle branch block: Echocardiogram on 07/05/2020 showed normal biventricular function, no significant valvular disease.    AAA: 3.5 cm infrarenal abdominal aortic aneurysm.  Will monitor.  Carotid stenosis: Continue aspirin, statin.  Carotid duplex on 07/05/2020 showed 1 to 39% stenosis in bilateral carotid arteries.  PAD: ABIs 05/2018 showed mild left lower extremity PAD (0.91 ABI, 0.43 TBI).  Normal on right (0.95 ABI, 0.68 TBI).  RTC in ***  Medication Adjustments/Labs and Tests Ordered: Current  medicines are reviewed at length with the patient today.  Concerns regarding medicines are outlined above.  No orders of the defined types were placed in this encounter.  No orders of the defined types were placed in this encounter.   There are no Patient Instructions on file for this visit.   Signed, Donato Heinz, MD  09/01/2020 10:27 PM    [facility]

## 2020-09-03 ENCOUNTER — Ambulatory Visit: Payer: Medicare Other | Admitting: Cardiology

## 2021-04-14 ENCOUNTER — Other Ambulatory Visit: Payer: Self-pay | Admitting: Urology

## 2021-06-03 ENCOUNTER — Telehealth: Payer: Self-pay | Admitting: Cardiology

## 2021-06-03 NOTE — Progress Notes (Signed)
°  Spoke with dr rose mda, pt has significant cough with runny nose since Christmas, per dr rose mda pt needs to be rescheduled for 1 week from 06-06-2021 surgery. Awaiting final cardiac clearance note from dr Gardiner Rhyme.

## 2021-06-03 NOTE — Telephone Encounter (Signed)
Dr. Gardiner Rhyme to review. I called and spoke with Tami Richards, she was seen a year ago for preop clearance and new LBBB. Subsequent echocardiogram was normal. Talking with the patient today, she says she does not have any new cardiac concerns such as chest pain and worsening dyspnea on exertion. Therefore, I think the patient can be cleared from the cardiac perspective. What do you think?  Otherwise, her main concern today is upper respiratory issue since Christmas. She says "I woke up Christmas morning sick as a dog." On further question, she denies any fever, but has quite significant cough and runny nose since Christmas morning. I urged her to call her surgeon to see if they would like to push back the surgery if she is still dealing with URI.   Please forward your response to P CV DIV PREOP

## 2021-06-03 NOTE — Telephone Encounter (Signed)
° °  [facility] Pre-operative Risk Assessment    Request for surgical clearance:  What type of surgery is being performed? Ureteroscopy exchange ureteral stint exchange   When is this surgery scheduled? 06/06/20   What type of clearance is required (medical clearance vs. Pharmacy clearance to hold med vs. Both)? medical  Are there any medications that need to be held prior to surgery and how long? TBD   Practice name and name of physician performing surgery?   Dr. Ashley Murrain   What is your office phone number [phone]    7.   What is your office fax number   8.   Anesthesia type (None, local, MAC, general) ? TBD   Anesthesia team wants her to be seen.  Milbert Coulter 06/03/2021, 12:43 PM  _________________________________________________________________   (provider comments below)

## 2021-06-03 NOTE — Telephone Encounter (Signed)
Spoke with Tami Richards, at the requesting surgeon's office, she states that the anesthesia team just needed cardiac clearance.  If pt can get cleared without being seen, that's fine with them, they just need clearance.

## 2021-06-03 NOTE — Telephone Encounter (Signed)
Please verify with the requesting team as anesthesia want the patient to have a office appt?  I attempted to call the requesting office, was on the phone for 15 min, no one picked up.

## 2021-06-03 NOTE — Progress Notes (Signed)
Patient has surgery scheduled for 06/06/2021 at [facility]. Patient last saw her cardiologist, Dr. Gardiner Rhyme on 06/07/2020. She was instructed to follow-up in 3 months. No follow-up is noted in Epic. Due to complex medical history, patient must follow-up with cardiologist and obtain cardiac clearance prior to surgery per Dr. Valma Cava, anesthesiologist on 06/03/21. Message left with [facility] (scheduler for Dr. Tresa Moore) on 06/03/21.

## 2021-06-04 NOTE — Telephone Encounter (Signed)
fax # [phone] Dr. Tresa Moore with [facility]

## 2021-06-04 NOTE — Telephone Encounter (Signed)
° ° °  Patient Name: Tami Richards  DOB: Jan 31, 1935 MRN: [id number]  Primary Cardiologist: Donato Heinz, MD  Chart reviewed as part of pre-operative protocol coverage. Given past medical history and time since last visit, based on ACC/AHA guidelines, [place] would be at acceptable risk for the planned procedure without further cardiovascular testing.   The patient was advised that if she develops new symptoms prior to surgery to contact our office to arrange for a follow-up visit, and she verbalized understanding.  I will route this recommendation to the requesting party via Epic fax function and remove from pre-op pool.  Please call with questions.  [place], [place] 06/04/2021, 3:26 PM

## 2021-06-04 NOTE — Telephone Encounter (Signed)
Agree with plan, no further cardiac work-up recommended at this time

## 2021-06-04 NOTE — Telephone Encounter (Signed)
Spoke with the patient's grandson, he is aware that the patient is cleared. Procedure is pushed back to Jan 20th as patient is currently fighting a cold vs upper respiratory infection.   Callback pool, please forward clearance to the surgeon's office. No fax number is documented.

## 2021-06-18 ENCOUNTER — Encounter (HOSPITAL_BASED_OUTPATIENT_CLINIC_OR_DEPARTMENT_OTHER): Payer: Self-pay | Admitting: Urology

## 2021-06-18 ENCOUNTER — Other Ambulatory Visit: Payer: Self-pay

## 2021-06-18 NOTE — Progress Notes (Addendum)
Spoke w/ via phone for pre-op interview--- pt and pt's grandson, alex Lab needs dos----  [employer] and ekg              Lab results------ no COVID test -----patient states asymptomatic no test needed Arrive at ------- 0645 on 06-20-2021 NPO after MN NO Solid Food.  Clear liquids from MN until--- 0545 Med rec completed Medications to take morning of surgery ----- synthroid, lipitor, claritin, breztri inhaler Diabetic medication ----- do not take amaryl morning of surgery  Patient instructed no nail polish to be worn day of surgery Patient instructed to bring photo id and insurance card day of surgery Patient aware to have Driver (ride ) / caregiver  for 24 hours after surgery --- grandson, alex thomlinson  Patient Special Instructions ----- asked pt to bring rescue inhaler dos Pre-Op special Istructions ----- grandson can be available if needed due to memory problem at times but knows her medications  Pt has telephone cardiology clearance by Almyra Deforest PA on 06-04-2021 in epic/ chart  Patient verbalized understanding of instructions that were given at this phone interview. Patient denies shortness of breath, chest pain, fever, cough at this phone interview.    Anesthesia Review: hx HTN (no meds sincew 09/ 2021); AAA (3.5cm); COPD (no supplemental oxygen) baseline DOE occasionally ;  DM2;  CKD IV;   PCP: Dr [name] (per pt lov 12/ 2022,  requested lov note) Cardiologist : Dr Levada Dy (lov 06-07-2020 epic) Nephrologist:  Dr [name] (lov 05/ 2021, per pt has seen since) Pulmonologist:  Dr Melvyn Novas [facility] 12-12-2019 epioc, has not been seen since) Chest x-ray : 12-12-2019 epic EKG : 06-07-2020 epic Echo : 07-05-2020 epic Stress test: no Cardiac Cath : no Activity level: per pt able to grocery shop and household chores without sob  Sleep Study/ CPAP : Yes/  No Fasting Blood Sugar :      / Checks Blood Sugar -- times a day:  once or twice weekly Blood Thinner/ Instructions /Last Dose: no ASA /  Instructions/ Last Dose :  ASA 81mg /  per dr Tresa Moore pt to continue

## 2021-06-20 ENCOUNTER — Encounter (HOSPITAL_BASED_OUTPATIENT_CLINIC_OR_DEPARTMENT_OTHER): Payer: Self-pay | Admitting: Urology

## 2021-06-20 ENCOUNTER — Ambulatory Visit (HOSPITAL_BASED_OUTPATIENT_CLINIC_OR_DEPARTMENT_OTHER)
Admission: RE | Admit: 2021-06-20 | Discharge: 2021-06-20 | Disposition: A | Discharge status: Home / Self Care | Payer: Medicare Other | Attending: Urology | Admitting: Urology

## 2021-06-20 ENCOUNTER — Ambulatory Visit (HOSPITAL_BASED_OUTPATIENT_CLINIC_OR_DEPARTMENT_OTHER): Payer: Medicare Other | Admitting: Anesthesiology

## 2021-06-20 ENCOUNTER — Other Ambulatory Visit: Payer: Self-pay

## 2021-06-20 ENCOUNTER — Encounter (HOSPITAL_BASED_OUTPATIENT_CLINIC_OR_DEPARTMENT_OTHER)
Admission: RE | Disposition: A | Discharge status: Home / Self Care | Payer: Self-pay | Source: Home / Self Care | Attending: Urology

## 2021-06-20 DIAGNOSIS — E039 Hypothyroidism, unspecified: Secondary | ICD-10-CM | POA: Insufficient documentation

## 2021-06-20 DIAGNOSIS — I129 Hypertensive chronic kidney disease with stage 1 through stage 4 chronic kidney disease, or unspecified chronic kidney disease: Secondary | ICD-10-CM | POA: Diagnosis not present

## 2021-06-20 DIAGNOSIS — N184 Chronic kidney disease, stage 4 (severe): Secondary | ICD-10-CM | POA: Insufficient documentation

## 2021-06-20 DIAGNOSIS — G4730 Sleep apnea, unspecified: Secondary | ICD-10-CM | POA: Diagnosis not present

## 2021-06-20 DIAGNOSIS — J449 Chronic obstructive pulmonary disease, unspecified: Secondary | ICD-10-CM | POA: Insufficient documentation

## 2021-06-20 DIAGNOSIS — E1122 Type 2 diabetes mellitus with diabetic chronic kidney disease: Secondary | ICD-10-CM | POA: Diagnosis not present

## 2021-06-20 DIAGNOSIS — Z8616 Personal history of COVID-19: Secondary | ICD-10-CM | POA: Diagnosis not present

## 2021-06-20 DIAGNOSIS — Z7984 Long term (current) use of oral hypoglycemic drugs: Secondary | ICD-10-CM | POA: Insufficient documentation

## 2021-06-20 DIAGNOSIS — N2889 Other specified disorders of kidney and ureter: Secondary | ICD-10-CM | POA: Insufficient documentation

## 2021-06-20 DIAGNOSIS — Z87891 Personal history of nicotine dependence: Secondary | ICD-10-CM | POA: Insufficient documentation

## 2021-06-20 DIAGNOSIS — N907 Vulvar cyst: Secondary | ICD-10-CM | POA: Insufficient documentation

## 2021-06-20 DIAGNOSIS — K661 Hemoperitoneum: Secondary | ICD-10-CM | POA: Insufficient documentation

## 2021-06-20 HISTORY — DX: Calculus of ureter: N20.1

## 2021-06-20 HISTORY — DX: Crossing vessel and stricture of ureter without hydronephrosis: N13.5

## 2021-06-20 HISTORY — DX: Personal history of other diseases of the circulatory system: Z86.79

## 2021-06-20 HISTORY — PX: CYSTOSCOPY W/ URETERAL STENT PLACEMENT: SHX1429

## 2021-06-20 LAB — POCT I-STAT, CHEM 8
BUN: 27 mg/dL — ABNORMAL HIGH (ref 8–23)
Calcium, Ion: 1.32 mmol/L (ref 1.15–1.40)
Chloride: 107 mmol/L (ref 98–111)
Creatinine, Ser: 2.2 mg/dL — ABNORMAL HIGH (ref 0.44–1.00)
Glucose, Bld: 123 mg/dL — ABNORMAL HIGH (ref 70–99)
HCT: 38 % (ref 36.0–46.0)
Hemoglobin: 12.9 g/dL (ref 12.0–15.0)
Potassium: 3.9 mmol/L (ref 3.5–5.1)
Sodium: 142 mmol/L (ref 135–145)
TCO2: 24 mmol/L (ref 22–32)

## 2021-06-20 LAB — GLUCOSE, CAPILLARY: Glucose-Capillary: 122 mg/dL — ABNORMAL HIGH (ref 70–99)

## 2021-06-20 SURGERY — CYSTOSCOPY, WITH RETROGRADE PYELOGRAM AND URETERAL STENT INSERTION
Anesthesia: General | Site: Ureter | Laterality: Right

## 2021-06-20 MED ORDER — FENTANYL CITRATE (PF) 100 MCG/2ML IJ SOLN
INTRAMUSCULAR | Status: DC | PRN
  Administered 2021-06-20 (×2): 50 ug via INTRAVENOUS

## 2021-06-20 MED ORDER — MEPERIDINE HCL 25 MG/ML IJ SOLN: 6.2500 mg | INTRAMUSCULAR | Status: DC | PRN

## 2021-06-20 MED ORDER — IOHEXOL 300 MG/ML  SOLN
INTRAMUSCULAR | Status: DC | PRN
  Administered 2021-06-20: 10 mL

## 2021-06-20 MED ORDER — ONDANSETRON HCL 4 MG/2ML IJ SOLN
INTRAMUSCULAR | Status: DC | PRN
  Administered 2021-06-20: 4 mg via INTRAVENOUS

## 2021-06-20 MED ORDER — ACETAMINOPHEN 500 MG PO TABS
1000.0000 mg | ORAL_TABLET | Freq: Once | ORAL | Status: AC
  Administered 2021-06-20: 1000 mg via ORAL

## 2021-06-20 MED ORDER — LIDOCAINE HCL (CARDIAC) PF 100 MG/5ML IV SOSY
PREFILLED_SYRINGE | INTRAVENOUS | Status: DC | PRN
  Administered 2021-06-20: 60 mg via INTRAVENOUS

## 2021-06-20 MED ORDER — ACETAMINOPHEN 500 MG PO TABS
ORAL_TABLET | ORAL | Status: AC
  Filled 2021-06-20: qty 2

## 2021-06-20 MED ORDER — DEXAMETHASONE SODIUM PHOSPHATE 10 MG/ML IJ SOLN
INTRAMUSCULAR | Status: AC
  Filled 2021-06-20: qty 1

## 2021-06-20 MED ORDER — BACITRACIN ZINC 500 UNIT/GM EX OINT
TOPICAL_OINTMENT | CUTANEOUS | Status: DC | PRN
  Administered 2021-06-20: 1 via TOPICAL

## 2021-06-20 MED ORDER — LIDOCAINE HCL (PF) 2 % IJ SOLN
INTRAMUSCULAR | Status: AC
  Filled 2021-06-20: qty 5

## 2021-06-20 MED ORDER — SODIUM CHLORIDE 0.9 % IR SOLN
Status: DC | PRN
  Administered 2021-06-20: 3000 mL via INTRAVESICAL

## 2021-06-20 MED ORDER — OXYCODONE HCL 5 MG/5ML PO SOLN: 5.0000 mg | Freq: Once | ORAL | Status: DC | PRN

## 2021-06-20 MED ORDER — SODIUM CHLORIDE 0.9 % IV SOLN: INTRAVENOUS | Status: DC

## 2021-06-20 MED ORDER — FENTANYL CITRATE (PF) 100 MCG/2ML IJ SOLN: 25.0000 ug | INTRAMUSCULAR | Status: DC | PRN

## 2021-06-20 MED ORDER — SULFAMETHOXAZOLE-TRIMETHOPRIM 800-160 MG PO TABS: 1.0000 | ORAL_TABLET | Freq: Every day | ORAL | 0 refills | Status: DC

## 2021-06-20 MED ORDER — TRAMADOL HCL 50 MG PO TABS
50.0000 mg | ORAL_TABLET | Freq: Three times a day (TID) | ORAL | 0 refills | Status: DC | PRN
Start: 2021-06-20 — End: 2022-02-13

## 2021-06-20 MED ORDER — CEFAZOLIN SODIUM-DEXTROSE 2-4 GM/100ML-% IV SOLN
INTRAVENOUS | Status: AC
  Filled 2021-06-20: qty 100

## 2021-06-20 MED ORDER — PROPOFOL 10 MG/ML IV BOLUS
INTRAVENOUS | Status: DC | PRN
  Administered 2021-06-20: 100 mg via INTRAVENOUS
  Administered 2021-06-20: 50 mg via INTRAVENOUS

## 2021-06-20 MED ORDER — DEXAMETHASONE SODIUM PHOSPHATE 4 MG/ML IJ SOLN
INTRAMUSCULAR | Status: DC | PRN
  Administered 2021-06-20: 4 mg via INTRAVENOUS

## 2021-06-20 MED ORDER — CEFAZOLIN SODIUM-DEXTROSE 2-4 GM/100ML-% IV SOLN
2.0000 g | INTRAVENOUS | Status: AC
  Administered 2021-06-20: 2 g via INTRAVENOUS

## 2021-06-20 MED ORDER — EPHEDRINE SULFATE-NACL 50-0.9 MG/10ML-% IV SOSY
PREFILLED_SYRINGE | INTRAVENOUS | Status: DC | PRN
  Administered 2021-06-20: 5 mg via INTRAVENOUS
  Administered 2021-06-20: 10 mg via INTRAVENOUS

## 2021-06-20 MED ORDER — ONDANSETRON HCL 4 MG/2ML IJ SOLN
INTRAMUSCULAR | Status: AC
  Filled 2021-06-20: qty 2

## 2021-06-20 MED ORDER — OXYCODONE HCL 5 MG PO TABS: 5.0000 mg | ORAL_TABLET | Freq: Once | ORAL | Status: DC | PRN

## 2021-06-20 MED ORDER — MIDAZOLAM HCL 2 MG/2ML IJ SOLN: 0.5000 mg | Freq: Once | INTRAMUSCULAR | Status: DC | PRN

## 2021-06-20 MED ORDER — FENTANYL CITRATE (PF) 100 MCG/2ML IJ SOLN
INTRAMUSCULAR | Status: AC
  Filled 2021-06-20: qty 2

## 2021-06-20 SURGICAL SUPPLY — 25 items
BAG DRAIN URO-CYSTO SKYTR STRL (DRAIN) ×3 IMPLANT
BAG DRN UROCATH (DRAIN) ×1
BASKET LASER NITINOL 1.9FR (BASKET) IMPLANT
BSKT STON RTRVL 120 1.9FR (BASKET)
CATH INTERMIT  6FR 70CM (CATHETERS) IMPLANT
CLOTH BEACON ORANGE TIMEOUT ST (SAFETY) ×3 IMPLANT
FIBER LASER FLEXIVA 365 (UROLOGICAL SUPPLIES) IMPLANT
GLOVE SURG ENC MOIS LTX SZ7.5 (GLOVE) ×3 IMPLANT
GOWN STRL REUS W/TWL LRG LVL3 (GOWN DISPOSABLE) ×3 IMPLANT
GUIDEWIRE ANG ZIPWIRE 038X150 (WIRE) ×3 IMPLANT
GUIDEWIRE STR DUAL SENSOR (WIRE) ×3 IMPLANT
IV NS 1000ML (IV SOLUTION) ×2
IV NS 1000ML BAXH (IV SOLUTION) ×2 IMPLANT
IV NS IRRIG 3000ML ARTHROMATIC (IV SOLUTION) ×3 IMPLANT
KIT TURNOVER CYSTO (KITS) ×3 IMPLANT
MANIFOLD NEPTUNE II (INSTRUMENTS) ×3 IMPLANT
NS IRRIG 500ML POUR BTL (IV SOLUTION) ×3 IMPLANT
PACK CYSTO (CUSTOM PROCEDURE TRAY) ×3 IMPLANT
STENT POLARIS 5FRX24 (STENTS) ×1 IMPLANT
SYR 10ML LL (SYRINGE) ×3 IMPLANT
TRACTIP FLEXIVA PULS ID 200XHI (Laser) IMPLANT
TRACTIP FLEXIVA PULSE ID 200 (Laser)
TUBE CONNECTING 12X1/4 (SUCTIONS) ×3 IMPLANT
TUBE FEEDING 8FR 16IN STR KANG (MISCELLANEOUS) ×1 IMPLANT
TUBING UROLOGY SET (TUBING) ×3 IMPLANT

## 2021-06-20 NOTE — Brief Op Note (Signed)
06/20/2021  9:11 AM  PATIENT:  Tami Richards  87 y.o. female  PRE-OPERATIVE DIAGNOSIS:  RIGHT URETERAL OBSTRUCTION AND STONE  POST-OPERATIVE DIAGNOSIS:  RIGHT URETERAL OBSTRUCTION AND STONE, Small Rt Labial Infected Sebaceous Cyst  PROCEDURE:  Procedure(s): CYSTOSCOPY WITH RETROGRADE PYELOGRAM/URETERAL STENT EXCHANGE (Right)  SURGEON:  Surgeon(s) and Role:    * [name], MD - Primary  PHYSICIAN ASSISTANT:   ASSISTANTS: none   ANESTHESIA:   general  EBL:  minimal   BLOOD ADMINISTERED:none  DRAINS: none   LOCAL MEDICATIONS USED:  NONE  SPECIMEN:  Source of Specimen:  Rt ureteral stent for disard  DISPOSITION OF SPECIMEN:  PATHOLOGY  COUNTS:  YES  TOURNIQUET:  * No tourniquets in log *  DICTATION: .Other Dictation: Dictation Number [id number]  PLAN OF CARE: Discharge to home after PACU  PATIENT DISPOSITION:  PACU - hemodynamically stable.   Delay start of Pharmacological VTE agent (>24hrs) due to surgical blood loss or risk of bleeding: yes

## 2021-06-20 NOTE — Op Note (Signed)
Tami Richards, ULLERY MEDICAL RECORD NO: [id number] ACCOUNT NO: [id number] DATE OF BIRTH: May 02, 1935 FACILITY: [place] LOCATION: [facility] PHYSICIAN: [name], MD  Operative Report   DATE OF PROCEDURE: 06/20/2021  PREOPERATIVE DIAGNOSIS:  Right retroperitoneal hematoma with obstruction of dominant kidney.  POSTOPERATIVE DIAGNOSIS:  Right retroperitoneal hematoma with obstruction of dominant kidney plus infected right labial cyst.  However, there is sebaceous cyst.  PROCEDURES PERFORMED:  Cystoscopy, right retrograde pyelogram interpretation, exchange of right ureteral stent, 5 x 24 Polaris, no tether.  ESTIMATED BLOOD LOSS:  Nil.  COMPLICATIONS:  None.  SPECIMEN:  Right ureteral stent discarded.  FINDINGS:   1.  Significant interval decrease in size of retroperitoneal hematoma, much less mass effect on right ureter. 2.  Stable right lower pole calcification consistent with known stone. 3.  Successful replacement of right ureteral stent. 4.  Approximately 1.5 cm early infected right labial cyst without frank abscess.  This was drained and antibiotic ointment applied.  INDICATIONS:  This is a pleasant 87 year old lady with history of severe COVID infection last year as a part of her infection, she developed a coagulopathy and a spontaneous retroperitoneal hematoma, which placed significant mass effect on her right  ureter, which is her dominant kidney.  She also has a large stone on the right side as well.  She has been temporized with stenting for renal decompression of her dominant kidney with stents changed approximately every 6 months for nearly 2 years. There  has been very slow resolution of her hematoma.  She presents today for repeat evaluation and stent exchange.  Informed consent was obtained and placed in medical record.  On interview this morning, she also complains of significant pain in the area of  her right labia and she feels that she has an early infected  cyst versus ingrown hair and she wishes to have evaluation of this under anesthesia as well.  DESCRIPTION OF PROCEDURE:  The patient being verified, procedure being cystoscopy, right ureteral stent exchange was confirmed.  Procedure timeout was performed.  Intravenous antibiotics administered.  General LMA anesthesia induced.  The patient was  placed into the low lithotomy position, sterile field was created, prepped and draped the patient's vagina, introitus and proximal thighs using iodine.  Cystourethroscopy was performed using 21-French rigid cystoscope with offset lens.  Inspection of the  bladder revealed no diverticula, calcifications or papillary lesions.  Distal end of the right ureteral stent was seen in situ.  There is minimal encrustation.  It was grasped, brought to the level of the urethral meatus.  Attempt was made to cannulate  this via its lumen.  However, due to incrustation, this was not possible.  As such, the stent was entirely removed, inspected and intact and set aside for discard and the right ureteral orifice was cannulated with a 6-French Foley catheter and a right  retrograde pyelogram was obtained.  Right retrograde pyelogram demonstrated a single right ureter, single system right kidney.  There was much less medial deviation of the right ureter consistent with interval further resolution of her retroperitoneal hematoma.  There is significantly less  hydronephrosis than on prior studies.  This was quite favorable.  It was felt that interval stent exchange would be most prudent today.  However, at her next stage procedure in 6 months, we may decide to proceed with ureteroscopy and addressing her  stone, so we can finally get her to a path of stent free.  As such, a new 5 x 24 Polaris stent was  placed over the sensor working wire using fluoroscopic guidance.  Good proximal and distal plane were noted.  Bladder was emptied per cystoscope.   Inspection was then performed of the  patient's introitus and labia and she did have an approximately 1-1.5 cm area of infected sebaceous cyst on her right labia.  There was no obvious fluctuance or large abscess. The sebaceous contents were expressed  with some minimal purulence and there was no tracking, no gas or concern for anything other than relatively mild skin infection. Area was copiously irrigated.  The procedure was terminated.  The patient tolerated the procedure well.  No immediate  complications. The patient was taken to postanesthesia care in stable condition.  Plan for discharge home.   PUS D: 06/20/2021 9:17:20 am T: 06/20/2021 6:19:00 pm  JOB: [id number]/ [id number]

## 2021-06-20 NOTE — Anesthesia Procedure Notes (Signed)
Procedure Name: LMA Insertion Date/Time: 06/20/2021 8:48 AM Performed by: Lieutenant Diego, CRNA Pre-anesthesia Checklist: Patient identified, Emergency Drugs available, Suction available and Patient being monitored Patient Re-evaluated:Patient Re-evaluated prior to induction Oxygen Delivery Method: Circle system utilized Preoxygenation: Pre-oxygenation with 100% oxygen Induction Type: IV induction Ventilation: Mask ventilation without difficulty LMA: LMA inserted LMA Size: 4.0 Number of attempts: 1 Placement Confirmation: positive ETCO2 and breath sounds checked- equal and bilateral Tube secured with: Tape Dental Injury: Teeth and Oropharynx as per pre-operative assessment

## 2021-06-20 NOTE — H&P (Signed)
Tami Richards is an 87 y.o. female.    Chief Complaint: Pre-OP RIGHT Ureteral Sten Exchange  HPI:   1 - Right Hydronephrosis 2/2 Retroperitoneal Hematoma- moderate right hydro by CT 9/16 on restaging of retroperitoneal hematoma that developes while anticoagulated for COVID treatment. Recived 4u blood and Hgb subsequently stabilzed. hematoam stable 9/16 compared to 9/11. Rt kidney dominant. Ipsilateral non-osbtructing stone.   Recent Course:  01/2020 - 5x24 Polaris stent placed 01/2020 for renal preservation and pre-stenting for eventual ureteroscopy for stone.  07/2020 - exchange 5x24 Polaris stent, still major obsruction from hematoma     2 - Stage 4 renal insuficency / Left Renal Atrophy - Cr 1.5-2 at baseline. CT 01/2020 with non-osbtructing Rt renal pelvis stone and left renal atrohpy. Cr now 1.4.     3 - Large Right Renal Stone - 1.6cm incidental Rt renal pelvis stone non-osbtructing on CT 2021. Several prior episodes managed medically.      PMH sig for C19(unvaccinated), LBBB (follows Tami Richards cards) Lives with grandsons at baseline but indepenant in all ADL's. Her daughter Tami Richards at [phone] is very involved and lives locally. Her PCP is Dr. Francesco Richards.     Today "Shawnta" is seen to proceed with RIGTH ureteral stetn exchange for obstruction relief of her functionally dominant kidney. No interval fevers.    Past Medical History:  Diagnosis Date   Aneurysm of infrarenal abdominal aorta    per CT 02-15-2020  3.5cm   Atherosclerosis of aorta [facility])    aortoiliac   Atrophy of left kidney    Carotid stenosis, asymptomatic, bilateral    per duplex in epic 07-05-2020  bilateral ICA 1-39%   CKD (chronic kidney disease), stage IV ([place])    followed by nephrologist at [facility]-- dr Tami Richards   COPD (chronic obstructive pulmonary disease) [facility])    pulmonology--- dr wert   Depression    DOE (dyspnea on exertion)    per pt  normal due to copd   Full dentures     GERD (gastroesophageal reflux disease)    Hiatal hernia    History of 2019 novel coronavirus disease (COVID-19) 02/05/2020   (05-28-2020  per pt and pt daughter, pt has been back to baseline)   hospital admission / discharged 02-19-2020 dx acute hypoxemia respiratory failure w/ covid pneumonia( only needed oxygen, no bipap) , hemorrhagic shock w/ acute right retroperitoneal bleed/ hematmoa (pt transfused), metabolic encepholpathy   History of hypertension    currently not taking medication since admission 09/ 2021 with covid   History of kidney stones    Hypothyroidism    followed by pcp   LBBB (left bundle branch block)    per ekg in epic 02-05-2020  (evaluated by cardiologist-- schumann 06-07-2020)   Lumbar compression fracture ([place]) 01/2017   L1   Mild sleep apnea    per pt yrs ago tested was told very mild , no cpap recommended   Obstruction of right ureter    Type 2 diabetes mellitus ([place])    followed by pcp---  (05-28-2020 per pt does not check sugars at home, stated pcp told she did not need too any more)   Ureteral calculus, right     Past Surgical History:  Procedure Laterality Date   CATARACT EXTRACTION W/ INTRAOCULAR LENS IMPLANT Right 2021   CYSTOSCOPY W/ URETERAL STENT PLACEMENT Right 02/18/2020   Procedure: CYSTOSCOPY WITH RETROGRADE PYELOGRAM/URETERAL STENT PLACEMENT;  Surgeon: Tami Frock, Richards;  Location: [facility];  Service: Urology;  Laterality:  Right;   CYSTOSCOPY WITH RETROGRADE PYELOGRAM, URETEROSCOPY AND STENT PLACEMENT Right 07/31/2020   Procedure: CYSTOSCOPY WITH RETROGRADE PYELOGRAM, DIAGNOSTIC URETEROSCOPY AND STENT EXCHANGE;  Surgeon: Tami Frock, Richards;  Location: [facility];  Service: Urology;  Laterality: Right;  31 MINS   URETEROSCOPY WITH HOLMIUM LASER LITHOTRIPSY  2002;  2008    Family History  Problem Relation Age of Onset   Hypertension Other    Social History:  reports that she quit smoking about 14 years ago. Her smoking use included cigarettes. She  has never used smokeless tobacco. She reports that she does not drink alcohol and does not use drugs.  Allergies:  Allergies  Allergen Reactions   Apple Other (See Comments)    Allergy test/ patient states she still eats it   Beef-Derived Products Other (See Comments)    Allergy test/ patient states she still eats it.   Chocolate Other (See Comments)    Allergy test / patient states she still eats it.   Eggs Or Egg-Derived Products Other (See Comments)    Allergy test / patient states she still eats it    Medications Prior to Admission  Medication Sig Dispense Refill   aspirin EC 81 MG tablet Take 81 mg by mouth at bedtime. Swallow whole.     atorvastatin (LIPITOR) 10 MG tablet Take 10 mg by mouth daily.     Budeson-Glycopyrrol-Formoterol (BREZTRI AEROSPHERE) 160-9-4.8 MCG/ACT AERO Inhale 2 puffs into the lungs 2 (two) times daily.     Cetirizine HCl (ZYRTEC ALLERGY) 10 MG CAPS Take 1 capsule by mouth daily.     glimepiride (AMARYL) 4 MG tablet Take 4 mg by mouth daily with breakfast.     levothyroxine (SYNTHROID, LEVOTHROID) 50 MCG tablet Take 50 mcg by mouth daily before breakfast.     montelukast (SINGULAIR) 10 MG tablet Take 10 mg by mouth at bedtime.     Multiple Vitamin (MULTIVITAMIN) capsule Take 1 capsule by mouth daily. One a day woman's     acetaminophen (TYLENOL) 500 MG tablet Take 500 mg by mouth as needed.     Albuterol Sulfate, sensor, (PROAIR DIGIHALER) 108 (90 Base) MCG/ACT AEPB Inhale 2 puffs into the lungs every 4 (four) hours as needed.     traMADol (ULTRAM) 50 MG tablet Take 1 tablet (50 mg total) by mouth every 8 (eight) hours as needed for moderate pain or severe pain. Post-operatively (Patient not taking: Reported on 06/18/2021) 10 tablet 0    No results found for this or any previous visit (from the past 48 hour(s)). No results found.  Review of Systems  Constitutional:  Negative for chills and fever.  Genitourinary:  Positive for hematuria.  All other  systems reviewed and are negative.  Height 5\' 3"  (1.6 m), weight 69.4 kg. Physical Exam Vitals reviewed.  HENT:     Head: Normocephalic.     Nose: Nose normal.  Eyes:     Pupils: Pupils are equal, round, and reactive to light.  Cardiovascular:     Rate and Rhythm: Normal rate.  Abdominal:     General: Abdomen is flat.  Genitourinary:    Comments: No CVAT at present Musculoskeletal:     Cervical back: Normal range of motion.  Skin:    General: Skin is warm.  Neurological:     Mental Status: She is alert.  Psychiatric:        Mood and Affect: Mood normal.     Comments: Stable, somewhat salty demeanor, at baseline.  Assessment/Plan  Proceed as planned with RIGHT ureteral stent exchange, retrograde. Hopefully the hematoma will eventually resolve / become non-obstructing at which point we will address her stone and hopefully get stent free. Risks, benefits, alternatives, expected peri-op course discussed.   Tami Frock, Richards 06/20/2021, 7:23 AM

## 2021-06-20 NOTE — Discharge Instructions (Addendum)
1 - You may have urinary urgency (bladder spasms) and bloody urine on / off with stent in place. This is normal.  2 - Apply over the counter neosporin or similar generic triply antibiotic ointment to area of right labial infection daily until pain / irritation resolved.   3 - Call MD or go to ER for fever >102, severe pain / nausea / vomiting not relieved by medications, or acute change in medical status  No Tylenol or acetaminophen until 2pm today if needed.   CYSTOSCOPY HOME CARE INSTRUCTIONS  Activity: Rest for the remainder of the day.  Do not drive or operate equipment today.  You may resume normal activities in one to two days as instructed by your physician.   Meals: Drink plenty of liquids and eat light foods such as gelatin or soup this evening.  You may return to a normal meal plan tomorrow.  Return to Work: You may return to work in one to two days or as instructed by your physician.  Special Instructions / Symptoms: Call your physician if any of these symptoms occur:   -persistent or heavy bleeding  -bleeding which continues after first few urination  -large blood clots that are difficult to pass  -urine stream diminishes or stops completely  -fever equal to or higher than 101 degrees Farenheit.  -cloudy urine with a strong, foul odor  -severe pain  Females should always wipe from front to back after elimination.  You may feel some burning pain when you urinate.  This should disappear with time.  Applying moist heat to the lower abdomen or a hot tub bath may help relieve the pain.    Post Anesthesia Home Care Instructions  Activity: Get plenty of rest for the remainder of the day. A responsible individual must stay with you for 24 hours following the procedure.  For the next 24 hours, DO NOT: -Drive a car -Paediatric nurse -Drink alcoholic beverages -Take any medication unless instructed by your physician -Make any legal decisions or sign important  papers.  Meals: Start with liquid foods such as gelatin or soup. Progress to regular foods as tolerated. Avoid greasy, spicy, heavy foods. If nausea and/or vomiting occur, drink only clear liquids until the nausea and/or vomiting subsides. Call your physician if vomiting continues.  Special Instructions/Symptoms: Your throat may feel dry or sore from the anesthesia or the breathing tube placed in your throat during surgery. If this causes discomfort, gargle with warm salt water. The discomfort should disappear within 24 hours.

## 2021-06-20 NOTE — Transfer of Care (Signed)
Immediate Anesthesia Transfer of Care Note  Patient: Tami Richards  Procedure(s) Performed: CYSTOSCOPY WITH RETROGRADE PYELOGRAM/URETERAL STENT EXCHANGE (Right: Ureter)  Patient Location: PACU  Anesthesia Type:General  Level of Consciousness: drowsy  Airway & Oxygen Therapy: Patient Spontanous Breathing and Patient connected to face mask oxygen  Post-op Assessment: Report given to RN and Post -op Vital signs reviewed and stable  Post vital signs: Reviewed and stable  Last Vitals:  Vitals Value Taken Time  BP 145/56 06/20/21 0918  Temp    Pulse 78 06/20/21 0920  Resp 13 06/20/21 0920  SpO2 100 % 06/20/21 0920  Vitals shown include unvalidated device data.  Last Pain:  Vitals:   06/20/21 0729  TempSrc: Oral         Complications: No notable events documented.

## 2021-06-20 NOTE — Anesthesia Preprocedure Evaluation (Addendum)
Anesthesia Evaluation  Patient identified by MRN, date of birth, ID band Patient awake    Reviewed: Allergy & Precautions, NPO status , Patient's Chart, lab work & pertinent test results  History of Anesthesia Complications Negative for: history of anesthetic complications  Airway Mallampati: I  TM Distance: >3 FB Neck ROM: Full    Dental  (+) Edentulous Upper, Edentulous Lower   Pulmonary sleep apnea (no CPAP required) , COPD,  COPD inhaler, former smoker,    breath sounds clear to auscultation       Cardiovascular hypertension, (-) angina Rhythm:Regular Rate:Normal  '22 ECHO; EF 55-60%, normal LVF, normal RVF, no significant valvular abnormalities   Neuro/Psych Depression negative neurological ROS     GI/Hepatic Neg liver ROS, hiatal hernia, GERD  Controlled,  Endo/Other  diabetes (glu 123), Oral Hypoglycemic AgentsHypothyroidism   Renal/GU Renal InsufficiencyRenal disease     Musculoskeletal   Abdominal   Peds  Hematology negative hematology ROS (+)   Anesthesia Other Findings   Reproductive/Obstetrics                            Anesthesia Physical Anesthesia Plan  ASA: 3  Anesthesia Plan: General   Post-op Pain Management: Minimal or no pain anticipated and Tylenol PO (pre-op)   Induction: Intravenous  PONV Risk Score and Plan: 3 and Ondansetron, Dexamethasone and Treatment may vary due to age or medical condition  Airway Management Planned: LMA  Additional Equipment: None  Intra-op Plan:   Post-operative Plan: Extubation in OR  Informed Consent: I have reviewed the patients History and Physical, chart, labs and discussed the procedure including the risks, benefits and alternatives for the proposed anesthesia with the patient or authorized representative who has indicated his/her understanding and acceptance.       Plan Discussed with: CRNA and Surgeon  Anesthesia  Plan Comments:        Anesthesia Quick Evaluation

## 2021-06-20 NOTE — Anesthesia Postprocedure Evaluation (Signed)
Anesthesia Post Note  Patient: Armed forces logistics/support/administrative officer  Procedure(s) Performed: CYSTOSCOPY WITH RETROGRADE PYELOGRAM/URETERAL STENT EXCHANGE (Right: Ureter)     Patient location during evaluation: PACU Anesthesia Type: General Level of consciousness: awake and alert, patient cooperative and oriented Pain management: pain level controlled Vital Signs Assessment: post-procedure vital signs reviewed and stable Respiratory status: spontaneous breathing, nonlabored ventilation and respiratory function stable Cardiovascular status: blood pressure returned to baseline and stable Postop Assessment: no apparent nausea or vomiting and adequate PO intake Anesthetic complications: no   No notable events documented.  Last Vitals:  Vitals:   06/20/21 0945 06/20/21 1000  BP: (!) 150/59 (!) 149/63  Pulse: 71 67  Resp: 15 15  Temp: (!) 36.3 C   SpO2: 94% 94%    Last Pain:  Vitals:   06/20/21 1000  TempSrc:   PainSc: 0-No pain                 Mylisa Brunson,E. Nira Visscher

## 2021-06-23 ENCOUNTER — Encounter (HOSPITAL_BASED_OUTPATIENT_CLINIC_OR_DEPARTMENT_OTHER): Payer: Self-pay | Admitting: Urology

## 2021-12-23 ENCOUNTER — Other Ambulatory Visit: Payer: Self-pay | Admitting: Urology

## 2022-01-06 IMAGING — DX DG HIP (WITH OR WITHOUT PELVIS) 1V*R*
1 series · 1 of 1 positions shown · non-contrast
Comparison: None.

CLINICAL DATA: RIGHT hip pain for days.  No known injury.

EXAM:
DG HIP (WITH OR WITHOUT PELVIS) 1V RIGHT

[hip ap]
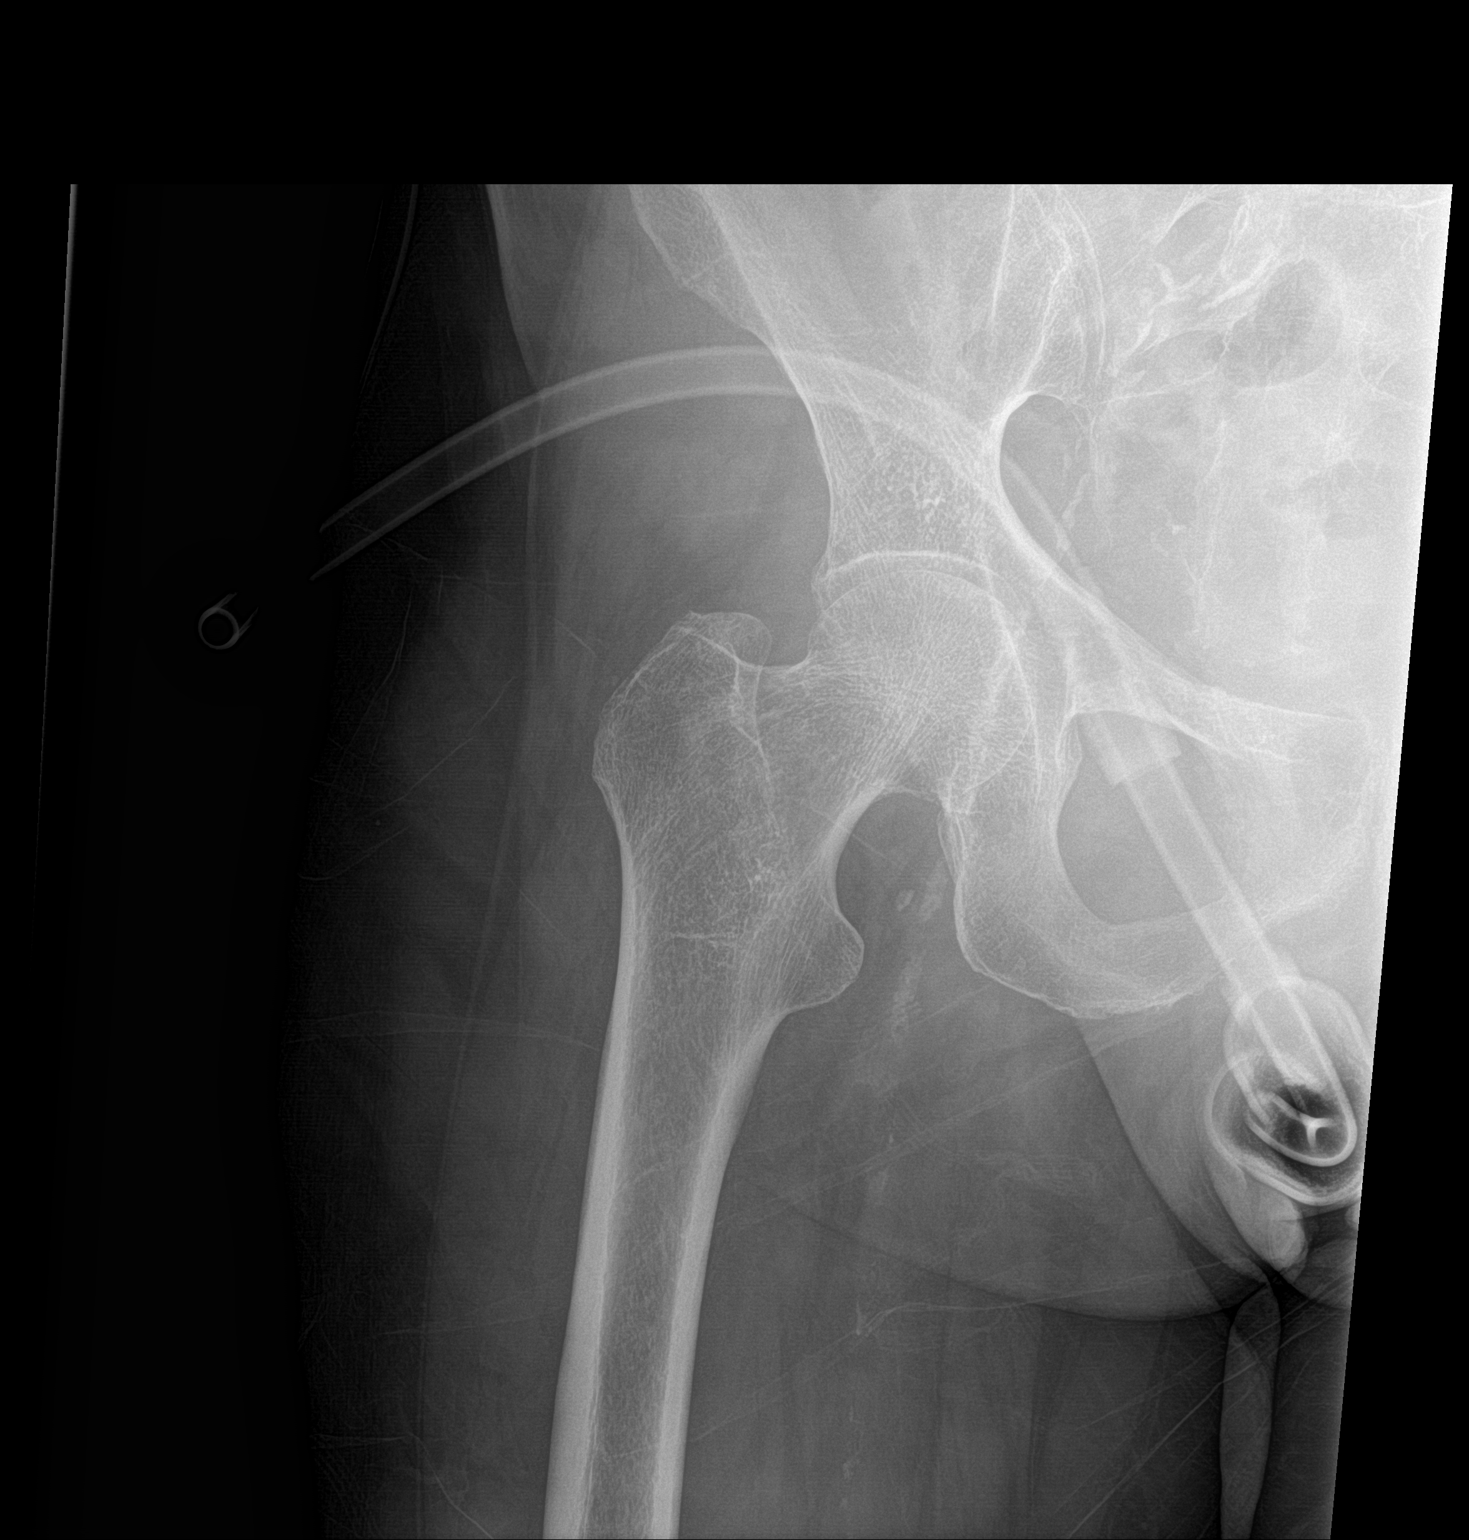

[1 of 1 positions shown; findings below may reference images not displayed]

FINDINGS: Osseous alignment is normal. Bone mineralization is normal. No
fracture line or displaced fracture fragment is seen. No acute or
suspicious osseous lesion. No degenerative change at the RIGHT hip
joint. Atherosclerotic calcifications are seen along the course of
the SFA. Soft tissues about the RIGHT hip are otherwise
unremarkable. Pelvic catheter in place.
IMPRESSION: 1. No acute findings. No degenerative change at the RIGHT hip joint.
2. Atherosclerosis.

## 2022-01-06 IMAGING — DX DG CHEST 1V PORT
1 series · 1 of 1 positions shown · non-contrast
Comparison: December 12, 2019

CLINICAL DATA: Shortness of breath and weakness.

[id number] positive.
EXAM:
PORTABLE CHEST 1 VIEW

[chest ap]
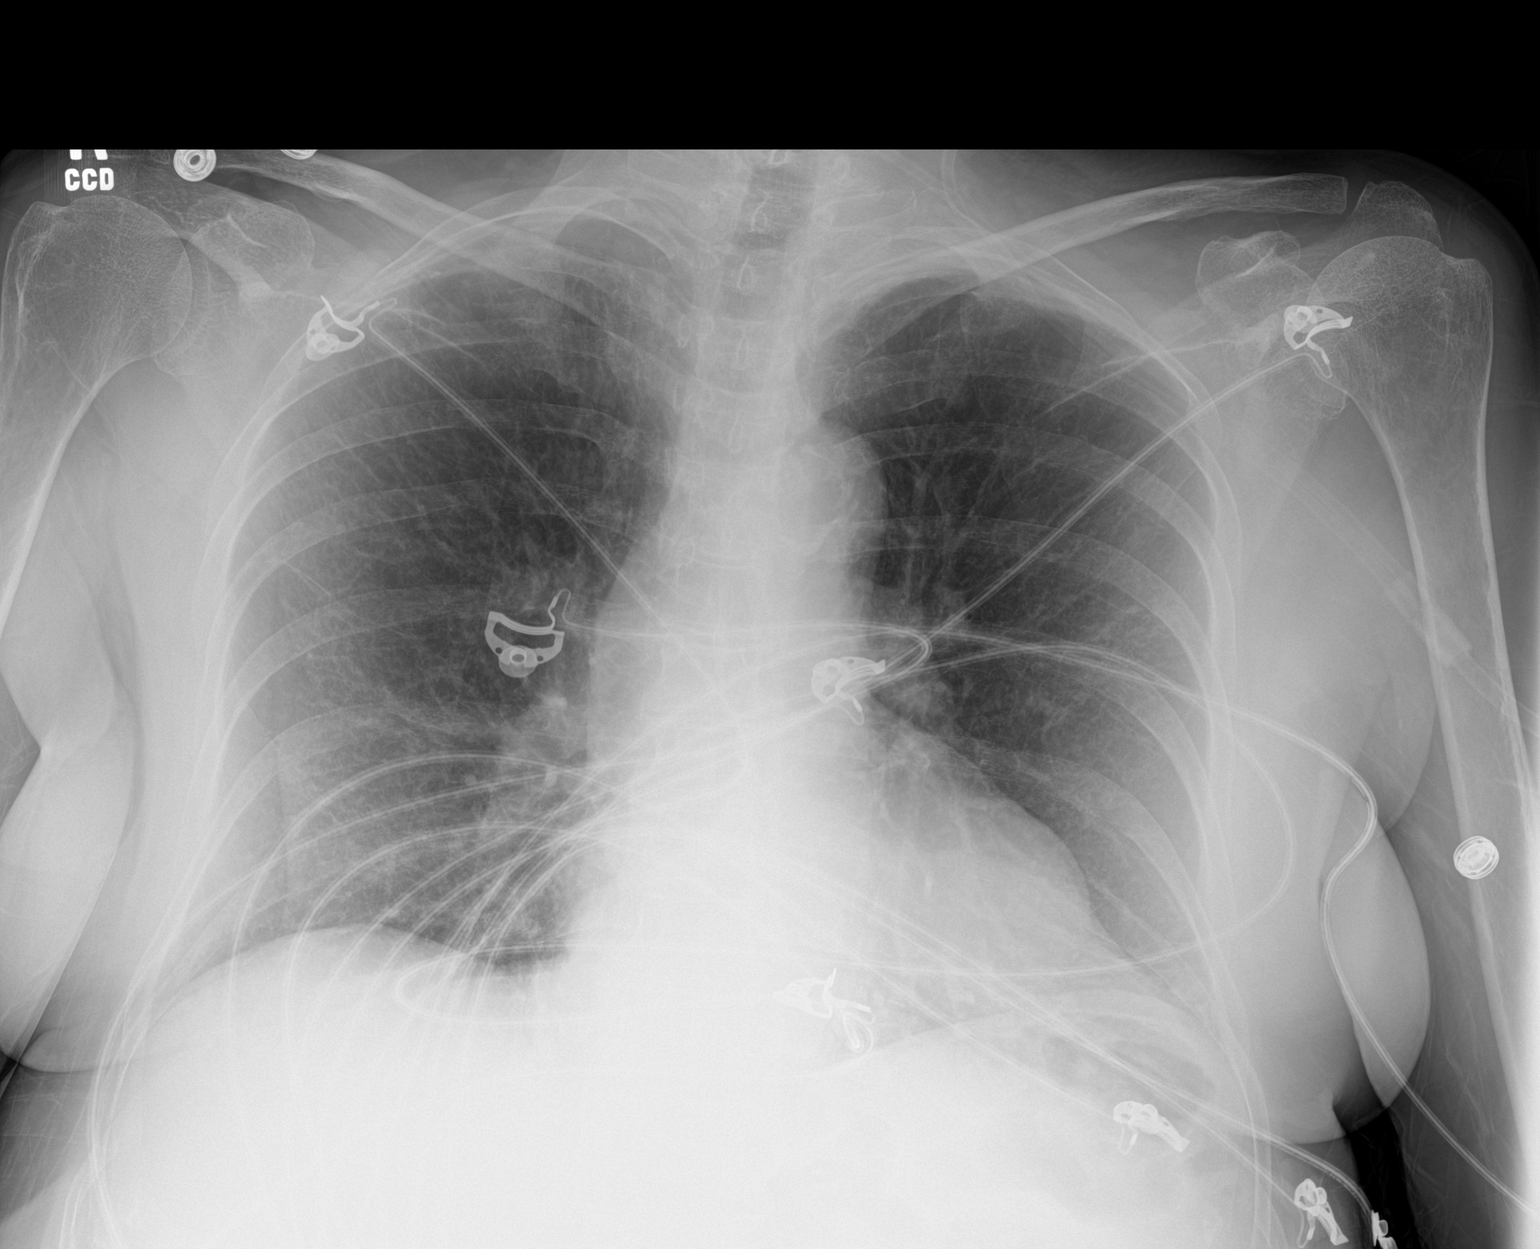

[1 of 1 positions shown; findings below may reference images not displayed]

FINDINGS: Cardiomediastinal silhouette is normal. Mediastinal contours appear
intact.

There is no evidence of focal airspace consolidation, pleural
effusion or pneumothorax. Chronic streaky airspace opacities in the
left lung base likely represent scarring versus atelectasis.

Osseous structures are without acute abnormality. Soft tissues are
grossly normal.
IMPRESSION: Chronic streaky airspace opacities in the left lung base likely
represent scarring versus atelectasis.

## 2022-01-09 ENCOUNTER — Encounter (HOSPITAL_BASED_OUTPATIENT_CLINIC_OR_DEPARTMENT_OTHER): Payer: Self-pay | Admitting: Urology

## 2022-01-09 ENCOUNTER — Other Ambulatory Visit: Payer: Self-pay

## 2022-01-09 DIAGNOSIS — L989 Disorder of the skin and subcutaneous tissue, unspecified: Secondary | ICD-10-CM

## 2022-01-09 HISTORY — DX: Disorder of the skin and subcutaneous tissue, unspecified: L98.9

## 2022-01-09 NOTE — Progress Notes (Addendum)
Spoke w/ via phone for pre-op interview---pt Lab needs dos----  [facility]             Lab results------06-20-2021 chart/epic COVID test -----patient states asymptomatic no test needed Arrive at -------1115 am 01-21-2022 NPO after MN NO Solid Food.  Clear liquids from MN until---1015 am Med rec completed Medications to take morning of surgery -----levothyroxine, atorvastatin, breztri inhaler Diabetic medication -----do not take amaryl morning of surgery Patient instructed no nail polish to be worn day of surgery Patient instructed to bring photo id and insurance card day of surgery Patient aware to have Driver (ride ) / caregiver  grandson alex   for 24 hours after surgery   Pre-Op special Istructions -----grandson can help with medications if needed Patient verbalized understanding of instructions that were given at this phone interview. Patient denies shortness of breath, chest pain, fever, cough at this phone interview.   Anesthesia Review:hx HTN,(no meds since 01/2020),  aneurysm of infrarenal abdominal aorta per ct 02-14-2022  3.5 cm, COPD (no supplemental oxygen used), has occ basline doe, DM Type 2, ckd stage 4 Addendum: reviewed patient medical history with dr Carlynn Herald, pt ok for 01-21-2022  surgery [facility] per dr e oddono mda.  PCP: Dr [name] Cardiologist :Dr. Levada Dy lov 06-07-2020 epic Nephrologist Dr [name] (lov requested) Pulmonologist : Dr Carney Corners 12-12-2019 epic Chest x-ray : 1 view 02-04-2022 epic EKG :06-20-2021 chart/epic Echo :07-05-2020 epic Stress test:none Cardiac Cath : none Activity level: per patient able to do household chores and shop without sob Sleep Study/ CPAP :none Fasting Blood Sugar :      / Checks Blood Sugar -- times a day:  checks cbg 1 or 2 x week Blood Thinner/ Instructions /Last Dose:none ASA / Instructions/ Last Dose :  81 mg aspirin per dr Tresa Moore to continue

## 2022-01-12 ENCOUNTER — Encounter (HOSPITAL_COMMUNITY): Payer: Self-pay | Admitting: Anesthesiology

## 2022-01-16 IMAGING — CT CT ABD-PELV W/O CM
2 of 4 series · 13 of 46 positions shown, 15 images · non-contrast
Comparison: CT abdomen pelvis 02/10/2020, 10/27/2006.

CLINICAL DATA: Decreasing hemoglobin and hematocrit in a patient
with previous retroperitoneal bleed.

EXAM:
CT ABDOMEN AND PELVIS WITHOUT CONTRAST
TECHNIQUE: Multidetector CT imaging of the abdomen and pelvis was performed
following the standard protocol without IV contrast.

[Series 2: axial st · axial · 0.77mm/px · z∈[+1157,+1547]mm · 10 of 90 slices shown, 12 images]
[im 6/90  soft-tissue]
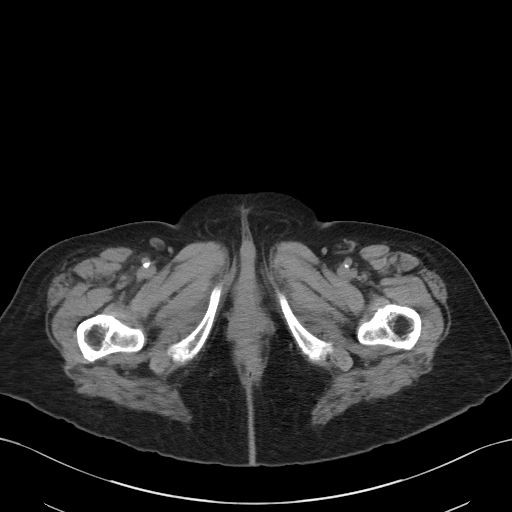
[im 6/90  bone]
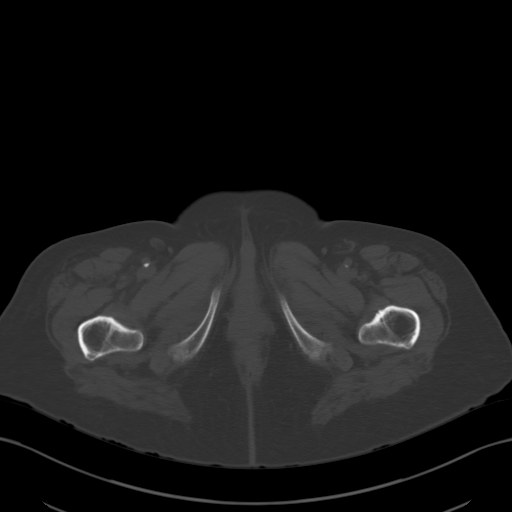
[im 17/90  soft-tissue]
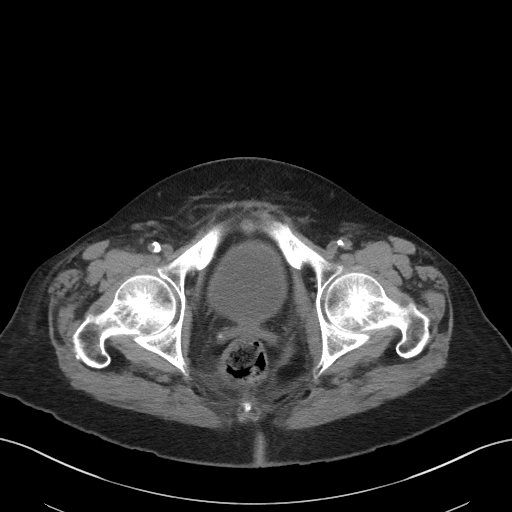
[im 23/90  soft-tissue]
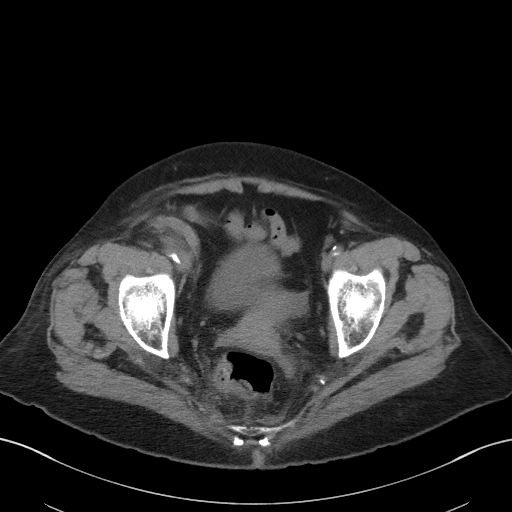
[im 34/90  soft-tissue]
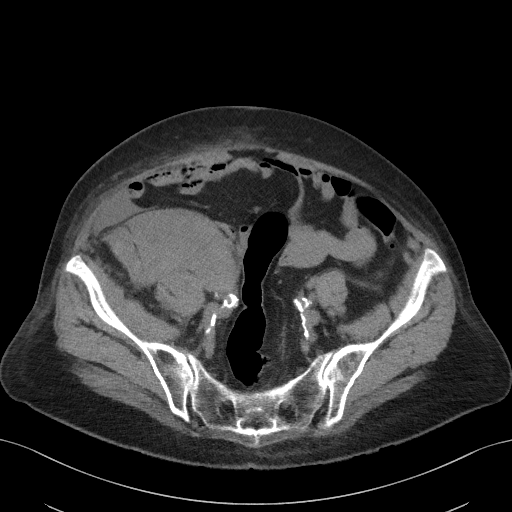
[im 39/90  soft-tissue]
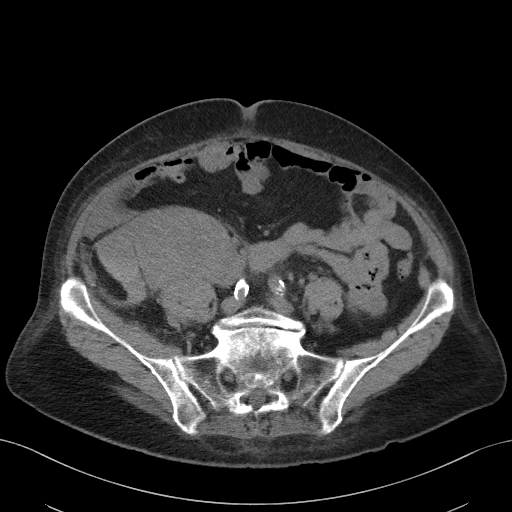
[im 51/90  soft-tissue]
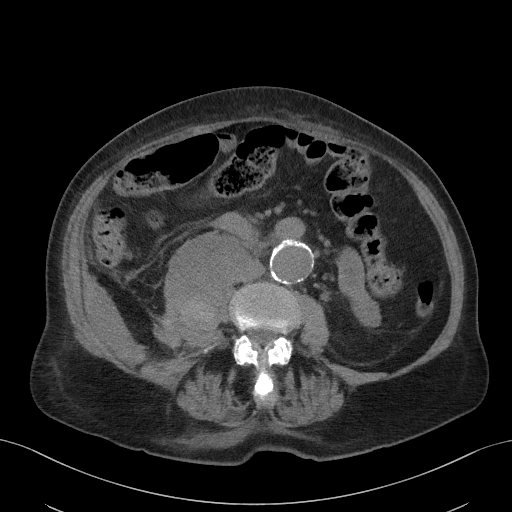
[im 56/90  soft-tissue]
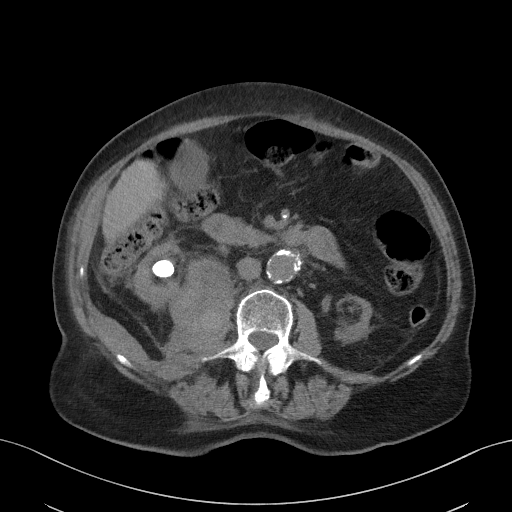
[im 67/90  soft-tissue]
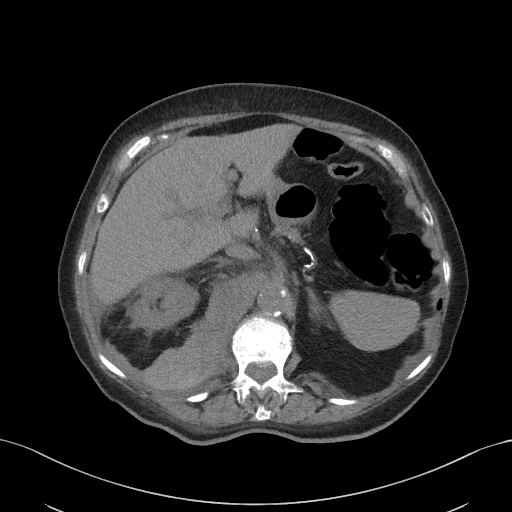
[im 73/90  soft-tissue]
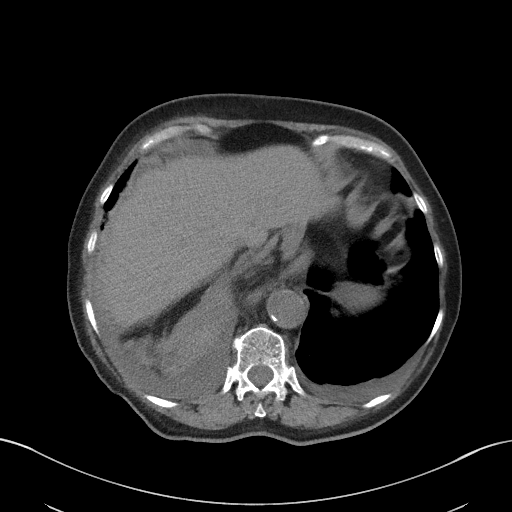
[im 73/90  bone]
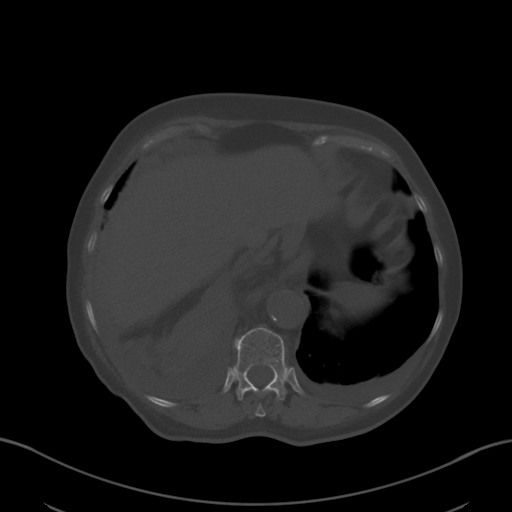
[im 84/90  soft-tissue]
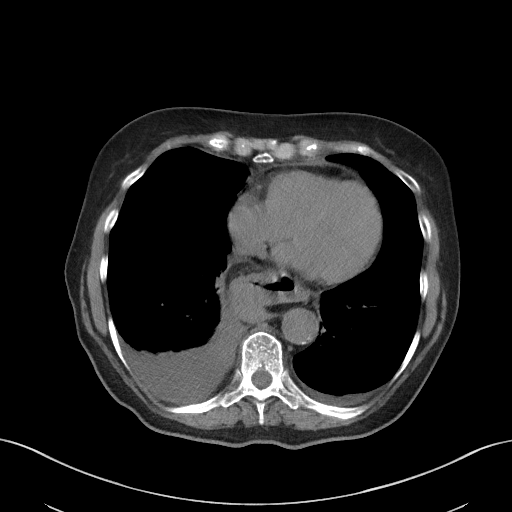

[Series 5: coronal st · coronal · 0.70mm/px · 3 of 101 slices shown]
[im 34/101  soft-tissue]
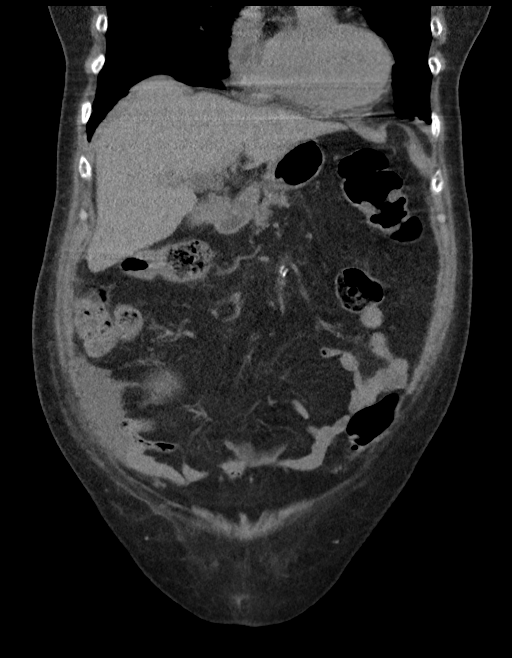
[im 45/101  soft-tissue]
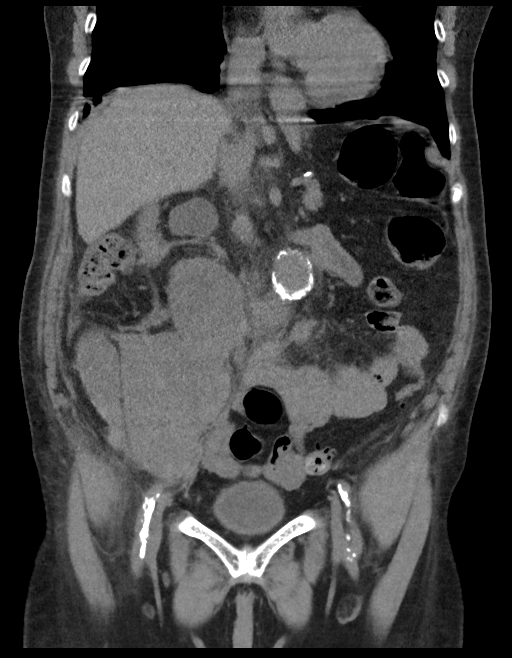
[im 56/101  soft-tissue]
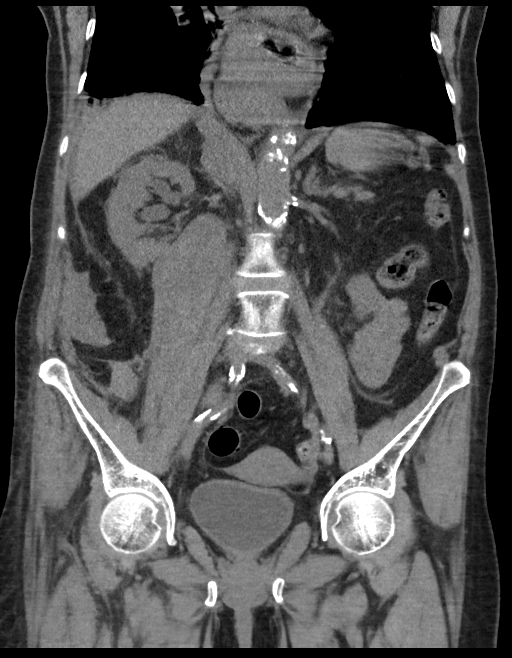

[13 of 46 positions shown; findings below may reference images not displayed]

FINDINGS: Lower chest: Interval increase in a trace to small volume right
pleural effusion. Interval development of a trace left pleural
effusion. Associated bilateral lower lobe passive atelectasis.
Linear atelectasis within the right lower lobe. 4 mm left lower lobe
subsolid nodule ([DATE]). Aortic root calcifications. At least 3 vessel
mild coronary artery calcifications. At least moderate volume hiatal
hernia.

Hepatobiliary: No focal liver abnormality is seen. Calcified
gallstones within the gallbladder lumen. No gallbladder wall
thickening or para cholecystic fluid. No biliary ductal dilatation.

Pancreas: Unremarkable. No pancreatic ductal dilatation or
surrounding inflammatory changes.

Spleen: Normal in size without focal abnormality.

Adrenals/Urinary Tract:

No adrenal nodule bilaterally.

Redemonstration of a well-defined round 1.6 cm calcified gallstone
within the inferior pole of the right kidney. Associated punctate
calculi is noted subjacent. Stable to slightly worsened mild to
moderate right hydronephrosis. The right ureter appears to course
along the retroperitoneal hematoma is poorly visualized. No
ureterolithiasis identified.

The left kidney is atrophic. No left hydronephrosis. No
contour-deforming renal mass. No ureterolithiasis or hydroureter.

The urinary bladder is unremarkable.

Stomach/Bowel: Stomach is within normal limits. Appendix appears
normal. No evidence of bowel wall thickening, distention, or
inflammatory changes. Scattered sigmoid diverticulosis.

Vascular/Lymphatic: Stable ectatic infrarenal abdominal aorta with a
caliber of 3.5 cm on axial imaging ([DATE]). No iliac artery
aneurysm. Severe atherosclerotic plaque of the aorta and its
branches. No abdominal, pelvic, or inguinal lymphadenopathy.

Reproductive: Uterus and bilateral adnexa are unremarkable.

Other:

Similar-appearing bilobed retroperitoneal hematoma that originates
at the level of the hemidiaphragm open ([DATE]), courses along the
right psoas muscle and medial to the right renal space as it travels
inferiorly to terminate at the level of right groin open ([DATE]).
Superior-most largest component measuring 7.5 x 7.7 cm on axial
imaging ([DATE]) and inferior-most largest component measuring 10.5 x
5.2 cm on axial imaging ([DATE], [DATE]). The hematoma appears to
involve the right psoas muscle superiorly ([DATE]). A separate the
contiguous component of the hematoma is again noted laterally and
measures up to 7.6 cm on axial imaging ([DATE]). Similar appearing
area of hyperdensity measuring 3 cm on axial imaging and extending
approximately 10 cm in the craniocaudal dimension likely represents
clotted blood. Similar area of layering hyperdensity posterior to
the liver along the right hemidiaphragm best imaged on sagittal view
([DATE]).

No simple free fluid ascites. No intraperitoneal free gas. No
organized fluid collection.

Musculoskeletal: Subcutaneous soft tissue edema and stranding along
the deep abdominal wall.

Diffusely decreased bone density. Multilevel degenerative changes of
the spine. Similar-appearing grade 2 anterolisthesis of L5 on S1.
Similar-appearing compression fracture of the L1 vertebral body with
greater than 95% height loss. No acute displaced fracture. No
suspicious lytic or blastic osseous lesions.
IMPRESSION: 1. Grossly stable large retroperitoneal hematoma (that appears to
involve the right psoas muscle superiorly) extending from the level
of the hemidiaphragm just posterior to the liver to the right groin.
Right psoas muscle as well as the liver (uncommon but possible
source of retroperitoneal hematoma) remain in the differential for
etiology of the hematoma.
2. Stable slightly increased mild to moderate right hydronephrosis
with etiology likely related to the right ureteral course along the
retroperitoneal hematoma.
3. Interval increase in small right pleural effusion.
4. Interval development of trace left pleural effusion.
5. Other imaging findings of potential clinical significance:
Nonobstructive 1.6 cm in punctate right nephrolithiasis. Atrophic
left kidney. Cholelithiasis. At least moderate volume hiatal hernia.
L1 vertebral body compression fracture with greater than 95% height
loss. Left lower lobe 4 mm sub solid pulmonary nodule.
6. Aortic aneurysm NOS (USVFX-H8Z.8).
7. Aortic Atherosclerosis (USVFX-Q6L.L).

## 2022-01-21 ENCOUNTER — Ambulatory Visit (HOSPITAL_BASED_OUTPATIENT_CLINIC_OR_DEPARTMENT_OTHER): Admission: RE | Admit: 2022-01-21 | Payer: Medicare Other | Source: Home / Self Care | Admitting: Urology

## 2022-01-21 DIAGNOSIS — Z01818 Encounter for other preprocedural examination: Secondary | ICD-10-CM

## 2022-01-21 HISTORY — DX: Unspecified cataract: H26.9

## 2022-01-21 SURGERY — CYSTOSCOPY, WITH RETROGRADE PYELOGRAM AND URETERAL STENT INSERTION
Anesthesia: General | Laterality: Right

## 2022-01-28 ENCOUNTER — Encounter (HOSPITAL_BASED_OUTPATIENT_CLINIC_OR_DEPARTMENT_OTHER): Payer: Self-pay | Admitting: Urology

## 2022-01-28 ENCOUNTER — Other Ambulatory Visit: Payer: Self-pay

## 2022-01-28 NOTE — Progress Notes (Signed)
Spoke w/ via phone for pre-op interview---pt and grandson Cristie Hem Lab [employer]---- I stat and CBG              Lab results------EKG 06/20/2021  COVID test -----patient states asymptomatic no test needed Arrive at -------1115  NPO after MN NO Solid Food.  Clear liquids from MN until---1015 Med rec completed Medications to take morning of surgery -----Levothyroxine, atorvastin, albuterol inhaler Diabetic medication -----Hold amaryl day of surgery Patient instructed no nail polish to be worn day of surgery Patient instructed to bring photo id and insurance card day of surgery Patient aware to have Driver (ride ) / caregiver grandson Cristie Hem Tomilson   for 24 hours after surgery  Patient Special Instructions -----grandson helps with meds and lives with her.  Pre-Op special Istructions -----no. Patient verbalized understanding of instructions that were given at this phone interview. Patient denies shortness of breath, chest pain, fever, cough at this phone interview.

## 2022-02-12 ENCOUNTER — Other Ambulatory Visit: Payer: Self-pay | Admitting: Urology

## 2022-02-13 ENCOUNTER — Encounter (HOSPITAL_BASED_OUTPATIENT_CLINIC_OR_DEPARTMENT_OTHER): Payer: Self-pay | Admitting: Urology

## 2022-02-13 ENCOUNTER — Ambulatory Visit (HOSPITAL_BASED_OUTPATIENT_CLINIC_OR_DEPARTMENT_OTHER): Payer: Medicare Other | Admitting: Anesthesiology

## 2022-02-13 ENCOUNTER — Other Ambulatory Visit: Payer: Self-pay

## 2022-02-13 ENCOUNTER — Ambulatory Visit (HOSPITAL_BASED_OUTPATIENT_CLINIC_OR_DEPARTMENT_OTHER)
Admission: RE | Admit: 2022-02-13 | Discharge: 2022-02-13 | Disposition: A | Discharge status: Home / Self Care | Payer: Medicare Other | Attending: Urology | Admitting: Urology

## 2022-02-13 ENCOUNTER — Encounter (HOSPITAL_BASED_OUTPATIENT_CLINIC_OR_DEPARTMENT_OTHER)
Admission: RE | Disposition: A | Discharge status: Home / Self Care | Payer: Self-pay | Source: Home / Self Care | Attending: Urology

## 2022-02-13 DIAGNOSIS — K661 Hemoperitoneum: Secondary | ICD-10-CM

## 2022-02-13 DIAGNOSIS — E039 Hypothyroidism, unspecified: Secondary | ICD-10-CM | POA: Insufficient documentation

## 2022-02-13 DIAGNOSIS — K219 Gastro-esophageal reflux disease without esophagitis: Secondary | ICD-10-CM | POA: Diagnosis not present

## 2022-02-13 DIAGNOSIS — N1330 Unspecified hydronephrosis: Secondary | ICD-10-CM

## 2022-02-13 DIAGNOSIS — E1151 Type 2 diabetes mellitus with diabetic peripheral angiopathy without gangrene: Secondary | ICD-10-CM | POA: Diagnosis not present

## 2022-02-13 DIAGNOSIS — G4730 Sleep apnea, unspecified: Secondary | ICD-10-CM | POA: Insufficient documentation

## 2022-02-13 DIAGNOSIS — Z87891 Personal history of nicotine dependence: Secondary | ICD-10-CM | POA: Insufficient documentation

## 2022-02-13 DIAGNOSIS — N132 Hydronephrosis with renal and ureteral calculous obstruction: Secondary | ICD-10-CM | POA: Insufficient documentation

## 2022-02-13 DIAGNOSIS — I129 Hypertensive chronic kidney disease with stage 1 through stage 4 chronic kidney disease, or unspecified chronic kidney disease: Secondary | ICD-10-CM | POA: Diagnosis not present

## 2022-02-13 DIAGNOSIS — N184 Chronic kidney disease, stage 4 (severe): Secondary | ICD-10-CM | POA: Diagnosis not present

## 2022-02-13 DIAGNOSIS — E1122 Type 2 diabetes mellitus with diabetic chronic kidney disease: Secondary | ICD-10-CM | POA: Diagnosis not present

## 2022-02-13 DIAGNOSIS — Z01818 Encounter for other preprocedural examination: Secondary | ICD-10-CM

## 2022-02-13 DIAGNOSIS — J449 Chronic obstructive pulmonary disease, unspecified: Secondary | ICD-10-CM | POA: Diagnosis not present

## 2022-02-13 HISTORY — PX: CYSTOSCOPY W/ URETERAL STENT PLACEMENT: SHX1429

## 2022-02-13 LAB — POCT I-STAT, CHEM 8
BUN: 29 mg/dL — ABNORMAL HIGH (ref 8–23)
Calcium, Ion: 1.2 mmol/L (ref 1.15–1.40)
Chloride: 109 mmol/L (ref 98–111)
Creatinine, Ser: 2.3 mg/dL — ABNORMAL HIGH (ref 0.44–1.00)
Glucose, Bld: 142 mg/dL — ABNORMAL HIGH (ref 70–99)
HCT: 39 % (ref 36.0–46.0)
Hemoglobin: 13.3 g/dL (ref 12.0–15.0)
Potassium: 3.8 mmol/L (ref 3.5–5.1)
Sodium: 141 mmol/L (ref 135–145)
TCO2: 23 mmol/L (ref 22–32)

## 2022-02-13 SURGERY — CYSTOSCOPY, WITH RETROGRADE PYELOGRAM AND URETERAL STENT INSERTION
Anesthesia: General | Site: Ureter | Laterality: Right

## 2022-02-13 MED ORDER — IOHEXOL 300 MG/ML  SOLN
INTRAMUSCULAR | Status: DC | PRN
  Administered 2022-02-13: 10 mL via URETHRAL

## 2022-02-13 MED ORDER — ONDANSETRON HCL 4 MG/2ML IJ SOLN
INTRAMUSCULAR | Status: DC | PRN
  Administered 2022-02-13: 4 mg via INTRAVENOUS

## 2022-02-13 MED ORDER — CEFAZOLIN SODIUM-DEXTROSE 2-4 GM/100ML-% IV SOLN
INTRAVENOUS | Status: AC
  Filled 2022-02-13: qty 100

## 2022-02-13 MED ORDER — PHENYLEPHRINE HCL (PRESSORS) 10 MG/ML IV SOLN
INTRAVENOUS | Status: DC | PRN
  Administered 2022-02-13: 80 ug via INTRAVENOUS

## 2022-02-13 MED ORDER — LIDOCAINE HCL (PF) 2 % IJ SOLN
INTRAMUSCULAR | Status: AC
  Filled 2022-02-13: qty 5

## 2022-02-13 MED ORDER — TRAMADOL HCL 50 MG PO TABS: 50.0000 mg | ORAL_TABLET | Freq: Three times a day (TID) | ORAL | 0 refills | Status: DC | PRN

## 2022-02-13 MED ORDER — OXYCODONE HCL 5 MG/5ML PO SOLN: 5.0000 mg | Freq: Once | ORAL | Status: DC | PRN

## 2022-02-13 MED ORDER — PROPOFOL 10 MG/ML IV BOLUS
INTRAVENOUS | Status: DC | PRN
  Administered 2022-02-13: 30 mg via INTRAVENOUS
  Administered 2022-02-13: 100 mg via INTRAVENOUS

## 2022-02-13 MED ORDER — FENTANYL CITRATE (PF) 100 MCG/2ML IJ SOLN
INTRAMUSCULAR | Status: AC
  Filled 2022-02-13: qty 2

## 2022-02-13 MED ORDER — OXYCODONE HCL 5 MG PO TABS: 5.0000 mg | ORAL_TABLET | Freq: Once | ORAL | Status: DC | PRN

## 2022-02-13 MED ORDER — ONDANSETRON HCL 4 MG/2ML IJ SOLN: 4.0000 mg | Freq: Once | INTRAMUSCULAR | Status: DC | PRN

## 2022-02-13 MED ORDER — SODIUM CHLORIDE 0.9 % IV SOLN
INTRAVENOUS | Status: DC
  Administered 2022-02-13: 1000 mL via INTRAVENOUS

## 2022-02-13 MED ORDER — FENTANYL CITRATE (PF) 100 MCG/2ML IJ SOLN
INTRAMUSCULAR | Status: DC | PRN
  Administered 2022-02-13 (×4): 12.5 ug via INTRAVENOUS

## 2022-02-13 MED ORDER — ACETAMINOPHEN 10 MG/ML IV SOLN: 1000.0000 mg | Freq: Once | INTRAVENOUS | Status: DC | PRN

## 2022-02-13 MED ORDER — FENTANYL CITRATE (PF) 100 MCG/2ML IJ SOLN: 25.0000 ug | INTRAMUSCULAR | Status: DC | PRN

## 2022-02-13 MED ORDER — ONDANSETRON HCL 4 MG/2ML IJ SOLN
INTRAMUSCULAR | Status: AC
  Filled 2022-02-13: qty 2

## 2022-02-13 MED ORDER — SODIUM CHLORIDE 0.9 % IR SOLN
Status: DC | PRN
  Administered 2022-02-13: 3000 mL

## 2022-02-13 MED ORDER — LIDOCAINE 2% (20 MG/ML) 5 ML SYRINGE: INTRAMUSCULAR | Status: DC | PRN

## 2022-02-13 MED ORDER — PROPOFOL 10 MG/ML IV BOLUS
INTRAVENOUS | Status: AC
  Filled 2022-02-13: qty 20

## 2022-02-13 SURGICAL SUPPLY — 18 items
BAG DRAIN URO-CYSTO SKYTR STRL (DRAIN) ×2 IMPLANT
BAG DRN UROCATH (DRAIN) ×1
CLOTH BEACON ORANGE TIMEOUT ST (SAFETY) ×2 IMPLANT
GLOVE BIO SURGEON STRL SZ7.5 (GLOVE) ×2 IMPLANT
GLOVE SURG SS PI 7.5 STRL IVOR (GLOVE) IMPLANT
GOWN STRL REUS W/TWL LRG LVL3 (GOWN DISPOSABLE) ×2 IMPLANT
GUIDEWIRE ANG ZIPWIRE 038X150 (WIRE) ×2 IMPLANT
GUIDEWIRE STR DUAL SENSOR (WIRE) ×2 IMPLANT
IV NS 1000ML (IV SOLUTION) ×1
IV NS 1000ML BAXH (IV SOLUTION) ×2 IMPLANT
IV NS IRRIG 3000ML ARTHROMATIC (IV SOLUTION) ×2 IMPLANT
KIT TURNOVER CYSTO (KITS) ×2 IMPLANT
MANIFOLD NEPTUNE II (INSTRUMENTS) ×2 IMPLANT
NS IRRIG 500ML POUR BTL (IV SOLUTION) ×2 IMPLANT
PACK CYSTO (CUSTOM PROCEDURE TRAY) ×2 IMPLANT
STENT POLARIS 5FRX24 (STENTS) IMPLANT
SYR 10ML LL (SYRINGE) ×2 IMPLANT
TUBE CONNECTING 12X1/4 (SUCTIONS) ×2 IMPLANT

## 2022-02-13 NOTE — OR Nursing (Signed)
Right ureteral stent was removed by Dr. Tresa Moore and discarded.

## 2022-02-13 NOTE — H&P (Signed)
Tami Richards is an 87 y.o. female.    Chief Complaint: Pre-Op RIGHT Ureteral Stent Exchange  HPI:   1 - Right Hydronephrosis 2/2 Retroperitoneal Hematoma- moderate right hydro by CT 01/2020 on restaging of retroperitoneal hematoma that developes while anticoagulated for COVID treatment. Recived 4u blood and Hgb subsequently stabilzed. Rt kidney dominant. Ipsilateral non-osbtructing stone.   Recent Course:  07/2020 - exchange 5x24 Polaris stent, still major obstruction from hematoma;  06/2021 - exchange 5x24 Polaris stent, still major obstruction from hematoma;   2 - Stage 4 renal insufficiency / Left Renal Atrophy - Cr 1.5-2 at baseline. CT 01/2020 with non-osbtructing Rt renal pelvis stone and left renal atrohpy. Cr now 1.4.   3 - Large Right Renal Stone - 1.6cm incidental Rt renal pelvis stone non-osbtructing on CT 2021. Several prior episodes managed medically.    PMH sig for C19(unvaccinated), LBBB (follows [name] cards) Lives with grandsons at baseline but indepenant in all ADL's. Her daughter [name] at [phone] is very involved and lives locally. Her PCP is Dr. [name].   Today "Tami Richards" is seen to proceed with RIGHT ureteral stent exchange / retrograde for obstruction of functionally dominant kidney. No interval fevers.     Past Medical History:  Diagnosis Date   Aneurysm of infrarenal abdominal aorta ([place])    per CT 02-15-2020  3.5cm   Atherosclerosis of aorta [facility])    aortoiliac   Atrophy of left kidney    Carotid stenosis, asymptomatic, bilateral    per duplex in epic 07-05-2020  bilateral ICA 1-39%   Cataract of right eye, unspecified cataract type    CKD (chronic kidney disease), stage IV ([place])    followed by nephrologist at [facility]-- dr [name]   COPD (chronic obstructive pulmonary disease) [facility])    pulmonology--- dr wert   Depression    DOE (dyspnea on exertion)    per pt  normal due to copd   Full dentures    GERD (gastroesophageal  reflux disease)    Hiatal hernia    History of 2019 novel coronavirus disease (COVID-19) 02/05/2020   (05-28-2020  per pt and pt daughter, pt has been back to baseline)   hospital admission / discharged 02-19-2020 dx acute hypoxemia respiratory failure w/ covid pneumonia( only needed oxygen, no bipap) , hemorrhagic shock w/ acute right retroperitoneal bleed/ hematmoa (pt transfused), metabolic encepholpathy   History of hypertension    currently not taking medication since admission 09/ 2021 with covid   History of kidney stones    Hypothyroidism    followed by pcp   LBBB (left bundle branch block)    per ekg in epic 02-05-2020  (evaluated by cardiologist-- schumann 06-07-2020)   Lumbar compression fracture ([place]) 01/2017   L1   Mild sleep apnea    per pt yrs ago tested was told very mild , no cpap recommended   Obstruction of right ureter    Skin abnormality 01/09/2022   scab on left ankle healing   Type 2 diabetes mellitus ([place])    followed by pcp---  (05-28-2020 per pt does not check sugars at home, stated pcp told she did not need too any more)   Ureteral calculus, right     Past Surgical History:  Procedure Laterality Date   CATARACT EXTRACTION W/ INTRAOCULAR LENS IMPLANT Left 02/14/2020   CYSTOSCOPY W/ URETERAL STENT PLACEMENT Right 02/18/2020   Procedure: CYSTOSCOPY WITH RETROGRADE PYELOGRAM/URETERAL STENT PLACEMENT;  Surgeon: [name], MD;  Location: [facility];  Service: Urology;  Laterality: Right;   CYSTOSCOPY W/ URETERAL STENT PLACEMENT Right 06/20/2021   Procedure: CYSTOSCOPY WITH RETROGRADE PYELOGRAM/URETERAL STENT EXCHANGE;  Surgeon: [name], MD;  Location[facility];  Service: Urology;  Laterality: Right;   CYSTOSCOPY WITH RETROGRADE PYELOGRAM, URETEROSCOPY AND STENT PLACEMENT Right 07/31/2020   Procedure: CYSTOSCOPY WITH RETROGRADE PYELOGRAM, DIAGNOSTIC URETEROSCOPY AND STENT EXCHANGE;  Surgeon: [name], MD;  Location: [facility];   Service: Urology;  Laterality: Right;  57 MINS   URETEROSCOPY WITH HOLMIUM LASER LITHOTRIPSY  2002;  2008    Family History  Problem Relation Age of Onset   Hypertension Other    Social History:  reports that she quit smoking about 14 years ago. Her smoking use included cigarettes. She has never used smokeless tobacco. She reports that she does not drink alcohol and does not use drugs.  Allergies:  Allergies  Allergen Reactions   Apple Juice Other (See Comments)    Allergy test/ patient states she still eats it   Beef-Derived Products Other (See Comments)    Allergy test/ patient states she still eats it.   Chocolate Other (See Comments)    Allergy test / patient states she still eats it.   Eggs Or Egg-Derived Products Other (See Comments)    Allergy test / patient states she still eats it    No medications prior to admission.    No results found for this or any previous visit (from the past 48 hour(s)). No results found.  Review of Systems  Constitutional:  Negative for chills and fever.  All other systems reviewed and are negative.   Height 5\' 6"  (1.676 m), weight 71.7 kg. Physical Exam Vitals reviewed.  Constitutional:      Comments: Thin, at baseline.   HENT:     Head: Normocephalic.  Eyes:     Pupils: Pupils are equal, round, and reactive to light.  Cardiovascular:     Rate and Rhythm: Normal rate.  Pulmonary:     Effort: Pulmonary effort is normal.  Abdominal:     General: Abdomen is flat.  Genitourinary:    Comments: No CVAT at present Musculoskeletal:        General: Normal range of motion.  Skin:    General: Skin is warm.  Neurological:     General: No focal deficit present.     Mental Status: She is alert.  Psychiatric:        Mood and Affect: Mood normal.      Assessment/Plan  Proceed as planned with RIGHT ureteroscopic stone manipulation. Risks,  benefits, alternatives, expected peri-op course discussed previously and reiterated today.    [name], MD 02/13/2022, 7:12 AM

## 2022-02-13 NOTE — Anesthesia Procedure Notes (Signed)
Procedure Name: LMA Insertion Date/Time: 02/13/2022 1:46 PM  Performed by: Justice Rocher, CRNAPre-anesthesia Checklist: Patient identified, Emergency Drugs available, Suction available, Patient being monitored and Timeout performed Patient Re-evaluated:Patient Re-evaluated prior to induction Oxygen Delivery Method: Circle system utilized Preoxygenation: Pre-oxygenation with 100% oxygen Induction Type: IV induction Ventilation: Mask ventilation without difficulty LMA: LMA inserted LMA Size: 4.0 Number of attempts: 1 Airway Equipment and Method: Bite block Placement Confirmation: positive ETCO2, breath sounds checked- equal and bilateral and CO2 detector Tube secured with: Tape Dental Injury: Teeth and Oropharynx as per pre-operative assessment

## 2022-02-13 NOTE — Discharge Instructions (Addendum)
1 - You may have urinary urgency (bladder spasms) and bloody urine on / off with stent in place. This is normal. ? ?2 - Call MD or go to ER for fever >102, severe pain / nausea / vomiting not relieved by medications, or acute change in medical status ? ? ?[facility] ?[phone] ?Post Ureteroscopy With or Without Stent Instructions ? ?Definitions: ? ?Ureter: The duct that transports urine from the kidney to the bladder. ?Stent:   A plastic hollow tube that is placed into the ureter, from the kidney to the bladder to prevent the ureter from swelling shut. ? ?GENERAL INSTRUCTIONS: ? ?Despite the fact that no skin incisions were used, the area around the ureter and bladder is raw and irritated. The stent is a foreign body which will further irritate the bladder wall. This irritation is manifested by increased frequency of urination, both day and night, and by an increase in the urge to urinate. In some, the urge to urinate is present almost always. Sometimes the urge is strong enough that you may not be able to stop yourself from urinating. The only real cure is to remove the stent and then give time for the bladder wall to heal which can't be done until the danger of the ureter swelling shut has passed, which varies. ? ?You may see some blood in your urine while the stent is in place and a few days afterwards. Do not be alarmed, even if the urine was clear for a while. Get off your feet and drink lots of fluids until clearing occurs. If you start to pass clots or don't improve, call us. ? ?DIET: ?You may return to your normal diet immediately. Because of the raw surface of your bladder, alcohol, spicy foods, acid type foods and drinks with caffeine may cause irritation or frequency and should be used in moderation. To keep your urine flowing freely and to avoid constipation, drink plenty of fluids during the day ( 8-10 glasses ). ?Tip: Avoid cranberry juice because it is very  acidic. ? ?ACTIVITY: ?Your physical activity doesn't need to be restricted. However, if you are very active, you may see some blood in your urine. We suggest that you reduce your activity under these circumstances until the bleeding has stopped. ? ?BOWELS: ?It is important to keep your bowels regular during the postoperative period. Straining with bowel movements can cause bleeding. A bowel movement every other day is reasonable. Use a mild laxative if needed, such as Milk of Magnesia 2-3 tablespoons, or 2 Dulcolax tablets. Call if you continue to have problems. If you have been taking narcotics for pain, before, during or after your surgery, you may be constipated. Take a laxative if necessary. ? ? ?MEDICATION: ?You should resume your pre-surgery medications unless told not to. In addition you will often be given an antibiotic to prevent infection. These should be taken as prescribed until the bottles are finished unless you are having an unusual reaction to one of the drugs. ? ?PROBLEMS YOU SHOULD REPORT TO US: ?Fevers over 100.5 Fahrenheit. ?Heavy bleeding, or clots ( See above notes about blood in urine ). ?Inability to urinate. ?Drug reactions ( hives, rash, nausea, vomiting, diarrhea ). ?Severe burning or pain with urination that is not improving. ? ?FOLLOW-UP: ?You will need a follow-up appointment to monitor your progress. Call for this appointment at the number listed above. Usually the first appointment will be about three to fourteen days after your surgery. ? ? ? ?  ?  Post Anesthesia Home Care Instructions ? ?Activity: ?Get plenty of rest for the remainder of the day. A responsible individual must stay with you for 24 hours following the procedure.  ?For the next 24 hours, DO NOT: ?-Drive a car ?-Operate machinery ?-Drink alcoholic beverages ?-Take any medication unless instructed by your physician ?-Make any legal decisions or sign important papers. ? ?Meals: ?Start with liquid foods such as gelatin or  soup. Progress to regular foods as tolerated. Avoid greasy, spicy, heavy foods. If nausea and/or vomiting occur, drink only clear liquids until the nausea and/or vomiting subsides. Call your physician if vomiting continues. ? ?Special Instructions/Symptoms: ?Your throat may feel dry or sore from the anesthesia or the breathing tube placed in your throat during surgery. If this causes discomfort, gargle with warm salt water. The discomfort should disappear within 24 hours. ? ? ?    ?

## 2022-02-13 NOTE — Anesthesia Postprocedure Evaluation (Signed)
Anesthesia Post Note  Patient: Armed forces logistics/support/administrative officer  Procedure(s) Performed: CYSTOSCOPY WITH RETROGRADE PYELOGRAM/URETERAL STENT REPLACEMENT (Right: Ureter)     Patient location during evaluation: PACU Anesthesia Type: General Level of consciousness: awake and alert Pain management: pain level controlled Vital Signs Assessment: post-procedure vital signs reviewed and stable Respiratory status: spontaneous breathing, nonlabored ventilation, respiratory function stable and patient connected to nasal cannula oxygen Cardiovascular status: blood pressure returned to baseline and stable Postop Assessment: no apparent nausea or vomiting Anesthetic complications: no   No notable events documented.  Last Vitals:  Vitals:   02/13/22 1413 02/13/22 1415  BP: (!) 129/51 128/66  Pulse: 64 66  Resp: 17 14  Temp:  (!) 36.4 C  SpO2: 95% 92%    Last Pain:  Vitals:   02/13/22 1415  PainSc: 0-No pain                 Anara Cowman S

## 2022-02-13 NOTE — Brief Op Note (Signed)
02/13/2022  2:05 PM  PATIENT:  Tami Richards  87 y.o. female  PRE-OPERATIVE DIAGNOSIS:  RIGHT Hydronephrosis  POST-OPERATIVE DIAGNOSIS:  Right Hydronephrosis  PROCEDURE:  Procedure(s): CYSTOSCOPY WITH RETROGRADE PYELOGRAM/URETERAL STENT REPLACEMENT (Right)  SURGEON:  Surgeon(s) and Role:    * [name], MD - Primary  PHYSICIAN ASSISTANT:   ASSISTANTS: none   ANESTHESIA:   general  EBL:  0 mL   BLOOD ADMINISTERED:none  DRAINS: none   LOCAL MEDICATIONS USED:  NONE  SPECIMEN:  Source of Specimen:  Rt stent   DISPOSITION OF SPECIMEN:   for discard  COUNTS:  YES  TOURNIQUET:  * No tourniquets in log *  DICTATION: .Other Dictation: Dictation Number [id number]  PLAN OF CARE: Discharge to home after PACU  PATIENT DISPOSITION:  PACU - hemodynamically stable.   Delay start of Pharmacological VTE agent (>24hrs) due to surgical blood loss or risk of bleeding: not applicable

## 2022-02-13 NOTE — Progress Notes (Signed)
Reported patient stating she's having increased burning on urination to Dr. Tresa Moore.  No new orders at this time.

## 2022-02-13 NOTE — Transfer of Care (Signed)
Immediate Anesthesia Transfer of Care Note  Patient: Tami Richards  Procedure(s) Performed: Procedure(s) (LRB): CYSTOSCOPY WITH RETROGRADE PYELOGRAM/URETERAL STENT REPLACEMENT (Right)  Patient Location: PACU  Anesthesia Type: General  Level of Consciousness: awake, sedated, patient cooperative and responds to stimulation  Airway & Oxygen Therapy: Patient Spontanous Breathing and Patient connected to RA  Post-op Assessment: Report given to PACU RN, Post -op Vital signs reviewed and stable and Patient moving all extremities  Post vital signs: Reviewed and stable  Complications: No apparent anesthesia complications

## 2022-02-13 NOTE — Anesthesia Preprocedure Evaluation (Addendum)
Anesthesia Evaluation  Patient identified by MRN, date of birth, ID band Patient awake    Reviewed: Allergy & Precautions, NPO status , Patient's Chart, lab work & pertinent test results  Airway Mallampati: II  TM Distance: >3 FB Neck ROM: Full    Dental  (+) Upper Dentures, Lower Dentures   Pulmonary sleep apnea , COPD, former smoker,    Pulmonary exam normal breath sounds clear to auscultation       Cardiovascular hypertension, + Peripheral Vascular Disease and + DOE  Normal cardiovascular exam Rhythm:Regular Rate:Normal     Neuro/Psych negative neurological ROS  negative psych ROS   GI/Hepatic Neg liver ROS, GERD  Medicated,  Endo/Other  diabetesHypothyroidism   Renal/GU Renal InsufficiencyRenal disease  negative genitourinary   Musculoskeletal negative musculoskeletal ROS (+)   Abdominal   Peds negative pediatric ROS (+)  Hematology negative hematology ROS (+)   Anesthesia Other Findings   Reproductive/Obstetrics negative OB ROS                            Anesthesia Physical Anesthesia Plan  ASA: 3  Anesthesia Plan: General   Post-op Pain Management: Minimal or no pain anticipated   Induction: Intravenous  PONV Risk Score and Plan: 3 and Ondansetron, Dexamethasone and Treatment may vary due to age or medical condition  Airway Management Planned: LMA  Additional Equipment:   Intra-op Plan:   Post-operative Plan: Extubation in OR  Informed Consent: I have reviewed the patients History and Physical, chart, labs and discussed the procedure including the risks, benefits and alternatives for the proposed anesthesia with the patient or authorized representative who has indicated his/her understanding and acceptance.     Dental advisory given  Plan Discussed with: CRNA and Surgeon  Anesthesia Plan Comments:         Anesthesia Quick Evaluation

## 2022-02-13 NOTE — Op Note (Unsigned)
Tami Richards, Tami Richards MEDICAL RECORD NO: [id number] ACCOUNT NO: [id number] DATE OF BIRTH: 1935/05/28 FACILITY: [facility] LOCATION: [facility] PHYSICIAN: [name], MD  Operative Report   DATE OF PROCEDURE: 02/13/2022  PREOPERATIVE DIAGNOSIS:  Right hydronephrosis of functionally dominant kidney due to large retroperitoneal hematoma.  POSTOPERATIVE DIAGNOSIS:  Right hydronephrosis of functionally dominant kidney due to large retroperitoneal hematoma, slowly improving.  PROCEDURE PERFORMED:   1.  Cystoscopy with right retrograde pyelogram interpretation. 2.  Exchange of right ureteral stent.  ESTIMATED BLOOD LOSS:  Nil.  COMPLICATIONS:  None.  SPECIMEN:  Right ureteral stent for discard.  FINDINGS: 1.  Some improvement of mass effect of right ureter from known retroperitoneal hematoma. 2.  Stable large right lower pole nonobstructing renal stone.  INDICATIONS:  The patient is an 87 year old lady with history of refractory urge incontinence as well as a functionally dominant right kidney who developed a large retroperitoneal hematoma approximately 2 years ago.  This has been very slow to resolve.  The hematoma resulted in extrinsic compression to her functionally solitary kidney leading to acute renal failure.  She has been managed with chronic stenting change approximately every 6 months since this time and her mass effect has changed incredibly  slowly.  She presents today for stent exchange.  Informed consent was obtained and placed in medical record.  PROCEDURE IN DETAIL:  The patient is being identified and verified, procedure being right ureteral stent exchange confirmed.  Procedure timeout was performed.  Intravenous antibiotics were administered and general LMA anesthesia was induced.  The patient  was placed into a low lithotomy position.  Sterile field was created, prepped and draped the patient's vagina, introitus, and proximal thighs using iodine.   Cystourethroscopy was performed using 21-French rigid cystoscope with offset lens.  Inspection  of urinary bladder revealed distal end of right ureteral stent in situ.  There is mild to moderate encrustation. It was brought out in its entirety set aside for discard, inspected and intact.  Right ureteral orifice was then cannulated with 6-French  renal catheter, and right retrograde pyelogram was obtained.  Right retrograde pyelogram demonstrated single right ureter, single system right kidney.  There was somewhat improved mass effect on the right ureter with less medial deviation and prior consistent with slow resolution of right retroperitoneal hematoma,  also has a nonobstructing large right renal stone noted.  A ZIPwire was advanced to the level of the lower pole and a new 5 x 24 Polaris type stent was carefully placed using fluoroscopic guidance.  Good proximal and distal planes were noted and the  procedure was terminated.  The patient tolerated the procedure well, no immediate perioperative complications.  The patient was taken to postanesthesia care unit in stable condition.  We will discuss possible change to right ureteroscopy next time with  goal of stone free and placing on a path to eventually remove her stent as it does appear that the mass effect is significantly improved.   PUS D: 02/13/2022 2:10:27 pm T: 02/13/2022 7:30:00 pm  JOB: [id number]/ [id number]

## 2022-02-16 ENCOUNTER — Encounter (HOSPITAL_BASED_OUTPATIENT_CLINIC_OR_DEPARTMENT_OTHER): Payer: Self-pay | Admitting: Urology

## 2022-08-06 ENCOUNTER — Ambulatory Visit (INDEPENDENT_AMBULATORY_CARE_PROVIDER_SITE_OTHER): Payer: 59 | Admitting: Podiatry

## 2022-08-06 DIAGNOSIS — M79674 Pain in right toe(s): Secondary | ICD-10-CM

## 2022-08-06 DIAGNOSIS — B351 Tinea unguium: Secondary | ICD-10-CM

## 2022-08-06 DIAGNOSIS — M79675 Pain in left toe(s): Secondary | ICD-10-CM | POA: Diagnosis not present

## 2022-08-06 NOTE — Progress Notes (Signed)
  Subjective:  Patient ID: Tami Richards, female    DOB: 1934/09/21,  MRN: [id number]  Chief Complaint  Patient presents with   Nail Problem    Pt stated that her nail came off    88 y.o. female returns for the above complaint.  Patient presents with thickened elongated thickened mycotic toenails x 10 mild pain on palpation she would like me to debride down she is not able to resolve.  No other acute complaints  Objective:  There were no vitals filed for this visit. Podiatric Exam: Vascular: dorsalis pedis and posterior tibial pulses are palpable bilateral. Capillary return is immediate. Temperature gradient is WNL. Skin turgor WNL  Sensorium: Normal Semmes Weinstein monofilament test. Normal tactile sensation bilaterally. Nail Exam: Pt has thick disfigured discolored nails with subungual debris noted bilateral entire nail hallux through fifth toenails.  Pain on palpation to the nails. Ulcer Exam: There is no evidence of ulcer or pre-ulcerative changes or infection. Orthopedic Exam: Muscle tone and strength are WNL. No limitations in general ROM. No crepitus or effusions noted.  Skin: No Porokeratosis. No infection or ulcers    Assessment & Plan:  No diagnosis found.  Patient was evaluated and treated and all questions answered.  Onychomycosis with pain  -Nails palliatively debrided as below. -Educated on self-care  Procedure: Nail Debridement Rationale: pain  Type of Debridement: manual, sharp debridement. Instrumentation: Nail nipper, rotary burr. Number of Nails: 10  Procedures and Treatment: Consent by patient was obtained for treatment procedures. The patient understood the discussion of treatment and procedures well. All questions were answered thoroughly reviewed. Debridement of mycotic and hypertrophic toenails, 1 through 5 bilateral and clearing of subungual debris. No ulceration, no infection noted.  Return Visit-Office Procedure: Patient instructed to return  to the office for a follow up visit 3 months for continued evaluation and treatment.  Boneta Lucks, DPM    No follow-ups on file.

## 2022-08-18 ENCOUNTER — Other Ambulatory Visit: Payer: Self-pay | Admitting: Urology

## 2022-09-10 ENCOUNTER — Encounter (HOSPITAL_BASED_OUTPATIENT_CLINIC_OR_DEPARTMENT_OTHER): Payer: Self-pay | Admitting: Urology

## 2022-09-11 ENCOUNTER — Encounter (HOSPITAL_BASED_OUTPATIENT_CLINIC_OR_DEPARTMENT_OTHER): Payer: Self-pay | Admitting: Urology

## 2022-09-11 NOTE — Progress Notes (Addendum)
Spoke w/ via phone for pre-op interview--- pt's grandson, alex Lab needs dos---- [employer] (gent)             Lab results------ no COVID test -----patient states asymptomatic no test needed Arrive at -------  1145 on 09-18-2022 NPO after MN NO Solid Food.  Clear liquids from MN until--- 1045 Med rec completed Medications to take morning of surgery ----- lipitor synthroid, breztri inaler Diabetic medication -----  do not take amaryl morning of surgery Patient instructed no nail polish to be worn day of surgery Patient instructed to bring photo id and insurance card day of surgery Patient aware to have Driver (ride ) / caregiver    for 24 hours after surgery --- grandson, alex Patient Special Instructions ----- asked to bring rescue inhaler Pre-Op special Istructions ----- in special needs from office:  per dr Berneice Heinrich pt to continue asa Patient verbalized understanding of instructions that were given at this phone interview. Patient denies shortness of breath, chest pain, fever, cough at this phone interview.

## 2022-09-15 ENCOUNTER — Encounter (HOSPITAL_BASED_OUTPATIENT_CLINIC_OR_DEPARTMENT_OTHER): Payer: Self-pay | Admitting: Urology

## 2022-09-17 NOTE — Anesthesia Preprocedure Evaluation (Addendum)
Anesthesia Evaluation  Patient identified by MRN, date of birth, ID band Patient awake    Reviewed: Allergy & Precautions, NPO status , Patient's Chart, lab work & pertinent test results  Airway Mallampati: II  TM Distance: >3 FB Neck ROM: Full    Dental no notable dental hx. (+) Upper Dentures, Lower Dentures   Pulmonary sleep apnea , pneumonia, COPD, former smoker   Pulmonary exam normal breath sounds clear to auscultation       Cardiovascular hypertension, + Peripheral Vascular Disease and + DOE  Normal cardiovascular exam Rhythm:Regular Rate:Normal     Neuro/Psych negative neurological ROS  negative psych ROS   GI/Hepatic Neg liver ROS,GERD  Medicated,,  Endo/Other  diabetesHypothyroidism    Renal/GU Renal InsufficiencyRenal diseaseLab Results      Component                Value               Date                      CREATININE               2.20 (H)            09/18/2022                BUN                      26 (H)              09/18/2022                NA                       141                 09/18/2022                K                        3.6                 09/18/2022                CL                       105                 09/18/2022                  negative genitourinary   Musculoskeletal negative musculoskeletal ROS (+)    Abdominal   Peds  (+) Delivery details - Hematology negative hematology ROS (+) Lab Results      Component                Value               Date                                HGB                      13.3                09/18/2022  HCT                      39.0                09/18/2022                Anesthesia Other Findings   Reproductive/Obstetrics negative OB ROS                             Anesthesia Physical Anesthesia Plan  ASA: 3  Anesthesia Plan: General   Post-op Pain Management: Minimal or no pain  anticipated, Ofirmev IV (intra-op)* and Tylenol PO (pre-op)*   Induction: Intravenous  PONV Risk Score and Plan: 3 and Ondansetron, Dexamethasone and Treatment may vary due to age or medical condition  Airway Management Planned: LMA  Additional Equipment: None  Intra-op Plan:   Post-operative Plan:   Informed Consent:      Dental advisory given  Plan Discussed with: CRNA and Surgeon  Anesthesia Plan Comments:         Anesthesia Quick Evaluation

## 2022-09-18 ENCOUNTER — Encounter (HOSPITAL_BASED_OUTPATIENT_CLINIC_OR_DEPARTMENT_OTHER): Payer: Self-pay | Admitting: Urology

## 2022-09-18 ENCOUNTER — Ambulatory Visit (HOSPITAL_BASED_OUTPATIENT_CLINIC_OR_DEPARTMENT_OTHER): Payer: 59 | Admitting: Anesthesiology

## 2022-09-18 ENCOUNTER — Ambulatory Visit (HOSPITAL_BASED_OUTPATIENT_CLINIC_OR_DEPARTMENT_OTHER)
Admission: RE | Admit: 2022-09-18 | Discharge: 2022-09-18 | Disposition: A | Discharge status: Home / Self Care | Payer: 59 | Attending: Urology | Admitting: Urology

## 2022-09-18 ENCOUNTER — Encounter (HOSPITAL_BASED_OUTPATIENT_CLINIC_OR_DEPARTMENT_OTHER)
Admission: RE | Disposition: A | Discharge status: Home / Self Care | Payer: Self-pay | Source: Home / Self Care | Attending: Urology

## 2022-09-18 ENCOUNTER — Other Ambulatory Visit: Payer: Self-pay

## 2022-09-18 DIAGNOSIS — I129 Hypertensive chronic kidney disease with stage 1 through stage 4 chronic kidney disease, or unspecified chronic kidney disease: Secondary | ICD-10-CM | POA: Insufficient documentation

## 2022-09-18 DIAGNOSIS — Z01818 Encounter for other preprocedural examination: Secondary | ICD-10-CM

## 2022-09-18 DIAGNOSIS — J449 Chronic obstructive pulmonary disease, unspecified: Secondary | ICD-10-CM

## 2022-09-18 DIAGNOSIS — N184 Chronic kidney disease, stage 4 (severe): Secondary | ICD-10-CM | POA: Insufficient documentation

## 2022-09-18 DIAGNOSIS — N132 Hydronephrosis with renal and ureteral calculous obstruction: Secondary | ICD-10-CM | POA: Diagnosis not present

## 2022-09-18 DIAGNOSIS — Z87891 Personal history of nicotine dependence: Secondary | ICD-10-CM | POA: Diagnosis not present

## 2022-09-18 DIAGNOSIS — G4730 Sleep apnea, unspecified: Secondary | ICD-10-CM | POA: Diagnosis not present

## 2022-09-18 DIAGNOSIS — Z8616 Personal history of COVID-19: Secondary | ICD-10-CM | POA: Diagnosis not present

## 2022-09-18 DIAGNOSIS — N202 Calculus of kidney with calculus of ureter: Secondary | ICD-10-CM

## 2022-09-18 DIAGNOSIS — E1122 Type 2 diabetes mellitus with diabetic chronic kidney disease: Secondary | ICD-10-CM | POA: Diagnosis not present

## 2022-09-18 HISTORY — DX: Secondary hyperparathyroidism of renal origin: N25.81

## 2022-09-18 HISTORY — PX: HOLMIUM LASER APPLICATION: SHX5852

## 2022-09-18 HISTORY — DX: Anemia in chronic kidney disease: D63.1

## 2022-09-18 HISTORY — DX: Presence of external hearing-aid: Z97.4

## 2022-09-18 HISTORY — PX: CYSTOSCOPY WITH RETROGRADE PYELOGRAM, URETEROSCOPY AND STENT PLACEMENT: SHX5789

## 2022-09-18 HISTORY — DX: Presence of spectacles and contact lenses: Z97.3

## 2022-09-18 HISTORY — DX: Chronic kidney disease, unspecified: N18.9

## 2022-09-18 LAB — POCT I-STAT, CHEM 8
BUN: 26 mg/dL — ABNORMAL HIGH (ref 8–23)
Calcium, Ion: 1.3 mmol/L (ref 1.15–1.40)
Chloride: 105 mmol/L (ref 98–111)
Creatinine, Ser: 2.2 mg/dL — ABNORMAL HIGH (ref 0.44–1.00)
Glucose, Bld: 106 mg/dL — ABNORMAL HIGH (ref 70–99)
HCT: 39 % (ref 36.0–46.0)
Hemoglobin: 13.3 g/dL (ref 12.0–15.0)
Potassium: 3.6 mmol/L (ref 3.5–5.1)
Sodium: 141 mmol/L (ref 135–145)
TCO2: 24 mmol/L (ref 22–32)

## 2022-09-18 LAB — GLUCOSE, CAPILLARY: Glucose-Capillary: 106 mg/dL — ABNORMAL HIGH (ref 70–99)

## 2022-09-18 SURGERY — CYSTOURETEROSCOPY, WITH RETROGRADE PYELOGRAM AND STENT INSERTION
Anesthesia: General | Site: Ureter | Laterality: Right

## 2022-09-18 MED ORDER — FENTANYL CITRATE (PF) 100 MCG/2ML IJ SOLN
INTRAMUSCULAR | Status: AC
  Filled 2022-09-18: qty 2

## 2022-09-18 MED ORDER — DEXAMETHASONE SODIUM PHOSPHATE 10 MG/ML IJ SOLN
INTRAMUSCULAR | Status: DC | PRN
  Administered 2022-09-18: 4 mg via INTRAVENOUS

## 2022-09-18 MED ORDER — PROPOFOL 10 MG/ML IV BOLUS
INTRAVENOUS | Status: AC
  Filled 2022-09-18: qty 20

## 2022-09-18 MED ORDER — ONDANSETRON HCL 4 MG/2ML IJ SOLN
INTRAMUSCULAR | Status: AC
  Filled 2022-09-18: qty 2

## 2022-09-18 MED ORDER — DEXAMETHASONE SODIUM PHOSPHATE 10 MG/ML IJ SOLN
INTRAMUSCULAR | Status: AC
  Filled 2022-09-18: qty 1

## 2022-09-18 MED ORDER — CEPHALEXIN 500 MG PO CAPS: 500.0000 mg | ORAL_CAPSULE | Freq: Two times a day (BID) | ORAL | 0 refills | Status: DC

## 2022-09-18 MED ORDER — LIDOCAINE 2% (20 MG/ML) 5 ML SYRINGE
INTRAMUSCULAR | Status: DC | PRN
  Administered 2022-09-18: 100 mg via INTRAVENOUS

## 2022-09-18 MED ORDER — LIDOCAINE HCL (PF) 2 % IJ SOLN
INTRAMUSCULAR | Status: AC
  Filled 2022-09-18: qty 5

## 2022-09-18 MED ORDER — PROPOFOL 10 MG/ML IV BOLUS
INTRAVENOUS | Status: DC | PRN
  Administered 2022-09-18: 120 mg via INTRAVENOUS
  Administered 2022-09-18: 40 mg via INTRAVENOUS
  Administered 2022-09-18: 30 mg via INTRAVENOUS

## 2022-09-18 MED ORDER — EPHEDRINE 5 MG/ML INJ
INTRAVENOUS | Status: AC
  Filled 2022-09-18: qty 5

## 2022-09-18 MED ORDER — SODIUM CHLORIDE 0.9 % IR SOLN
Status: DC | PRN
  Administered 2022-09-18 (×2): 3000 mL

## 2022-09-18 MED ORDER — GENTAMICIN SULFATE 40 MG/ML IJ SOLN
2.0000 mg/kg | INTRAVENOUS | Status: AC
  Administered 2022-09-18: 120 mg via INTRAVENOUS
  Filled 2022-09-18: qty 3

## 2022-09-18 MED ORDER — FENTANYL CITRATE (PF) 100 MCG/2ML IJ SOLN
INTRAMUSCULAR | Status: DC | PRN
  Administered 2022-09-18 (×4): 25 ug via INTRAVENOUS

## 2022-09-18 MED ORDER — LACTATED RINGERS IV SOLN: INTRAVENOUS | Status: DC

## 2022-09-18 MED ORDER — IOHEXOL 300 MG/ML  SOLN
INTRAMUSCULAR | Status: DC | PRN
  Administered 2022-09-18: 10 mL via URETHRAL

## 2022-09-18 MED ORDER — GENTAMICIN SULFATE 40 MG/ML IJ SOLN
2.0000 mg/kg | INTRAVENOUS | Status: DC
  Filled 2022-09-18: qty 3

## 2022-09-18 MED ORDER — TRAMADOL HCL 50 MG PO TABS: 50.0000 mg | ORAL_TABLET | Freq: Four times a day (QID) | ORAL | 0 refills | Status: DC | PRN

## 2022-09-18 MED ORDER — EPHEDRINE SULFATE-NACL 50-0.9 MG/10ML-% IV SOSY
PREFILLED_SYRINGE | INTRAVENOUS | Status: DC | PRN
  Administered 2022-09-18: 5 mg via INTRAVENOUS
  Administered 2022-09-18: 10 mg via INTRAVENOUS
  Administered 2022-09-18: 5 mg via INTRAVENOUS

## 2022-09-18 MED ORDER — ONDANSETRON HCL 4 MG/2ML IJ SOLN
INTRAMUSCULAR | Status: DC | PRN
  Administered 2022-09-18: 4 mg via INTRAVENOUS

## 2022-09-18 MED ORDER — SODIUM CHLORIDE 0.9 % IV SOLN: INTRAVENOUS | Status: DC

## 2022-09-18 SURGICAL SUPPLY — 24 items
BAG DRAIN URO-CYSTO SKYTR STRL (DRAIN) ×2 IMPLANT
BAG DRN UROCATH (DRAIN) ×1
BASKET LASER NITINOL 1.9FR (BASKET) IMPLANT
BSKT STON RTRVL 120 1.9FR (BASKET)
CATH URETL OPEN END 6FR 70 (CATHETERS) IMPLANT
CLOTH BEACON ORANGE TIMEOUT ST (SAFETY) ×2 IMPLANT
COVER DOME SNAP 22 D (MISCELLANEOUS) IMPLANT
GLOVE BIO SURGEON STRL SZ7.5 (GLOVE) ×2 IMPLANT
GOWN STRL REUS W/TWL LRG LVL3 (GOWN DISPOSABLE) ×2 IMPLANT
GUIDEWIRE ANG ZIPWIRE 038X150 (WIRE) ×2 IMPLANT
GUIDEWIRE STR DUAL SENSOR (WIRE) ×2 IMPLANT
IV NS IRRIG 3000ML ARTHROMATIC (IV SOLUTION) ×2 IMPLANT
KIT TURNOVER CYSTO (KITS) ×2 IMPLANT
MANIFOLD NEPTUNE II (INSTRUMENTS) ×2 IMPLANT
NS IRRIG 500ML POUR BTL (IV SOLUTION) ×2 IMPLANT
PACK CYSTO (CUSTOM PROCEDURE TRAY) ×2 IMPLANT
SHEATH NAVIGATOR HD 11/13X28 (SHEATH) IMPLANT
SLEEVE SCD COMPRESS KNEE MED (STOCKING) ×2 IMPLANT
STENT POLARIS 5FRX24 (STENTS) IMPLANT
SYR 10ML LL (SYRINGE) ×2 IMPLANT
TRACTIP FLEXIVA PULS ID 200XHI (Laser) IMPLANT
TRACTIP FLEXIVA PULSE ID 200 (Laser)
TUBE CONNECTING 12X1/4 (SUCTIONS) ×2 IMPLANT
TUBING UROLOGY SET (TUBING) ×2 IMPLANT

## 2022-09-18 NOTE — Brief Op Note (Signed)
09/18/2022  3:03 PM  PATIENT:  Tami Richards  88 y.o. female  PRE-OPERATIVE DIAGNOSIS:  RIGHT RENAL STONE  POST-OPERATIVE DIAGNOSIS:  RIGHT RENAL STONE  PROCEDURE:  Procedure(s): FIRST STAGE CYSTOSCOPY WITH RETROGRADE PYELOGRAM, URETEROSCOPY AND STENT EXCHANGE (Right) HOLMIUM LASER APPLICATION (Right)  SURGEON:  Surgeon(s) and Role:    * Lenni Reckner, [name]., MD - Primary  PHYSICIAN ASSISTANT:   ASSISTANTS: none   ANESTHESIA:   general  EBL:  minimal   BLOOD ADMINISTERED:none  DRAINS: none   LOCAL MEDICATIONS USED:  NONE  SPECIMEN:  No Specimen  DISPOSITION OF SPECIMEN:  N/A  COUNTS:  YES  TOURNIQUET:  * No tourniquets in log *  DICTATION: .Other Dictation: Dictation Number [id number]  PLAN OF CARE: Discharge to home after PACU  PATIENT DISPOSITION:  PACU - hemodynamically stable.   Delay start of Pharmacological VTE agent (>24hrs) due to surgical blood loss or risk of bleeding: not applicable

## 2022-09-18 NOTE — Transfer of Care (Signed)
Immediate Anesthesia Transfer of Care Note  Patient: Tami Richards  Procedure(s) Performed: FIRST STAGE CYSTOSCOPY WITH RETROGRADE PYELOGRAM, URETEROSCOPY AND STENT EXCHANGE (Right: Ureter) HOLMIUM LASER APPLICATION (Right: Ureter)  Patient Location: PACU  Anesthesia Type:General  Level of Consciousness: drowsy, patient cooperative, and responds to stimulation  Airway & Oxygen Therapy: Patient Spontanous Breathing and Patient connected to face mask oxygen  Post-op Assessment: Report given to RN and Post -op Vital signs reviewed and stable  Post vital signs: Reviewed and stable  Last Vitals:  Vitals Value Taken Time  BP 175/62 09/18/22 1502  Temp    Pulse 76 09/18/22 1513  Resp 18 09/18/22 1513  SpO2 100 % 09/18/22 1513  Vitals shown include unvalidated device data.  Last Pain:  Vitals:   09/18/22 1206  TempSrc: Oral         Complications: No notable events documented.

## 2022-09-18 NOTE — Anesthesia Procedure Notes (Signed)
Procedure Name: LMA Insertion Date/Time: 09/18/2022 1:32 PM  Performed by: Bishop Limbo, CRNAPre-anesthesia Checklist: Patient identified, Emergency Drugs available, Suction available and Patient being monitored Patient Re-evaluated:Patient Re-evaluated prior to induction Oxygen Delivery Method: Circle System Utilized Preoxygenation: Pre-oxygenation with 100% oxygen Induction Type: IV induction Ventilation: Mask ventilation without difficulty LMA: LMA inserted LMA Size: 4.0 Number of attempts: 1 Placement Confirmation: positive ETCO2 Tube secured with: Tape Dental Injury: Teeth and Oropharynx as per pre-operative assessment

## 2022-09-18 NOTE — H&P (Signed)
Tami Richards is an 88 y.o. female.    Chief Complaint: Pre-Op RIGHT 1st stage Ureteroscopic Stone Manipulation   HPI:   1 - Right Hydronephrosis 2/2 Retroperitoneal Hematoma- moderate right hydro by CT 01/2020 on restaging of retroperitoneal hematoma that developes while anticoagulated for COVID treatment. Recived 4u blood and Hgb subsequently stabilzed. Rt kidney dominant. Ipsilateral non-osbtructing stone.   Recent Course:  07/2020 - exchange 5x24 Polaris stent, still major obstruction from hematoma;  06/2021 - exchange 5x24 Polaris stent, still major obstruction from hematoma;  01/2022 - exchange 5x24 Polaris stent, slightly improved obstruction from hematoma;  07/2022 - CT resolution of prior hematoma, Rt renal stoen abtou 2.5cm persists     2 - Stage 4 renal insufficiency / Left Renal Atrophy - Cr 1.5-2 at baseline. CT 01/2020 with non-osbtructing Rt renal pelvis stone and left renal atrohpy. Cr now 1.4.     3 - Large Right Renal Stone - 1.6cm incidental Rt renal pelvis stone non-osbtructing on CT 2021. Several prior episodes managed medically.        PMH sig for C19(unvaccinated), LBBB (follows Nathaniel Man MD cards) Lives with grandsons at baseline but indepenant in all ADL's. Her daughter Misty Stanley at [phone] is very involved and lives locally. Her PCP is Dr. Thornell Mule.     Today "Tami Richards" is seen to proceed with first stage RIGHT ureteroscopic stone manipulation. NO interval fevers. She has been keflex proph pre-op.    Past Medical History:  Diagnosis Date   Anemia associated with chronic renal failure    Atherosclerosis of aorta    aortoiliac   Atrophy of left kidney    Carotid stenosis, asymptomatic, bilateral    per duplex in epic 07-05-2020  bilateral ICA 1-39%   CKD (chronic kidney disease), stage IV    followed by nephrologist at [facility]-- dr Glenna Fellows   COPD (chronic obstructive pulmonary disease)    pulmonology--- dr wert   Depression    DOE (dyspnea  on exertion)    per pt  normal due to copd   Full dentures    GERD (gastroesophageal reflux disease)    Hiatal hernia    History of hypertension    currently not taking medication since admission 09/ 2021 with covid   History of kidney stones    Hypothyroidism    followed by pcp   LBBB (left bundle branch block)    per ekg in epic 02-05-2020  (evaluated by cardiologist-- schumann 06-07-2020)   Lumbar compression fracture 01/2017   L1   Mild sleep apnea    per pt yrs ago tested was told very mild , no cpap recommended   Obstruction of right ureter    Renal calculus, right    large   Secondary hyperparathyroidism of renal origin    Type 2 diabetes mellitus    followed by pcp---  ( per pt does not check sugars at home, stated pcp told she did not need too any more)   Wears glasses    Wears hearing aid in both ears     Past Surgical History:  Procedure Laterality Date   CATARACT EXTRACTION W/ INTRAOCULAR LENS IMPLANT Left 02/14/2020   CYSTOSCOPY W/ URETERAL STENT PLACEMENT Right 02/18/2020   Procedure: CYSTOSCOPY WITH RETROGRADE PYELOGRAM/URETERAL STENT PLACEMENT;  Surgeon: Sebastian Ache, MD;  Location: [facility];  Service: Urology;  Laterality: Right;   CYSTOSCOPY W/ URETERAL STENT PLACEMENT Right 06/20/2021   Procedure: CYSTOSCOPY WITH RETROGRADE PYELOGRAM/URETERAL STENT EXCHANGE;  Surgeon: Sebastian Ache, MD;  Location: [facility];  Service: Urology;  Laterality: Right;   CYSTOSCOPY W/ URETERAL STENT PLACEMENT Right 02/13/2022   Procedure: CYSTOSCOPY WITH RETROGRADE PYELOGRAM/URETERAL STENT REPLACEMENT;  Surgeon: Sebastian Ache, MD;  Location: [facility];  Service: Urology;  Laterality: Right;   CYSTOSCOPY WITH RETROGRADE PYELOGRAM, URETEROSCOPY AND STENT PLACEMENT Right 07/31/2020   Procedure: CYSTOSCOPY WITH RETROGRADE PYELOGRAM, DIAGNOSTIC URETEROSCOPY AND STENT EXCHANGE;  Surgeon: Sebastian Ache, MD;  Location: [facility];  Service: Urology;   Laterality: Right;  90 MINS   URETEROSCOPY WITH HOLMIUM LASER LITHOTRIPSY  2002;  2008    Family History  Problem Relation Age of Onset   Hypertension Other    Social History:  reports that she quit smoking about 15 years ago. Her smoking use included cigarettes. She has never used smokeless tobacco. She reports that she does not drink alcohol and does not use drugs.  Allergies:  Allergies  Allergen Reactions   Apple Juice Other (See Comments)    Allergy test/ patient states she still eats it   Beef-Derived Products Other (See Comments)    Allergy test/ patient states she still eats it.   Chocolate Other (See Comments)    Allergy test / patient states she still eats it.   Egg-Derived Products Other (See Comments)    Allergy test / patient states she still eats it    No medications prior to admission.    No results found for this or any previous visit (from the past 48 hour(s)). No results found.  Review of Systems  Constitutional:  Negative for chills and fever.  All other systems reviewed and are negative.   Height  (1.676 m), weight 69.4 kg. Physical Exam Vitals reviewed.  Constitutional:      Comments: THin, frial, stable salty disposition, at baseline.   HENT:     Head: Normocephalic.     Nose: Nose normal.  Eyes:     Pupils: Pupils are equal, round, and reactive to light.  Cardiovascular:     Rate and Rhythm: Normal rate.  Pulmonary:     Effort: Pulmonary effort is normal.  Abdominal:     General: Abdomen is flat.  Musculoskeletal:        General: Normal range of motion.     Cervical back: Normal range of motion.  Skin:    General: Skin is warm.  Neurological:     General: No focal deficit present.     Mental Status: She is alert.  Psychiatric:        Mood and Affect: Mood normal.      Assessment/Plan  Proceed as planned with RIGHT 1st stage ureteroscopic stone manipulaiton. Risks, benefits, alternatives, expected peri-op course discussed  previously and reiteratd today.   Loletta Parish., MD 09/18/2022, 7:10 AM

## 2022-09-18 NOTE — Discharge Instructions (Addendum)
1 - You may have urinary urgency (bladder spasms) and bloody urine on / off with stent in place. This is normal. ? ?2 - Call MD or go to ER for fever >102, severe pain / nausea / vomiting not relieved by medications, or acute change in medical status ? ? ?[facility] ?[phone] ?Post Ureteroscopy With or Without Stent Instructions ? ?Definitions: ? ?Ureter: The duct that transports urine from the kidney to the bladder. ?Stent:   A plastic hollow tube that is placed into the ureter, from the kidney to the bladder to prevent the ureter from swelling shut. ? ?GENERAL INSTRUCTIONS: ? ?Despite the fact that no skin incisions were used, the area around the ureter and bladder is raw and irritated. The stent is a foreign body which will further irritate the bladder wall. This irritation is manifested by increased frequency of urination, both day and night, and by an increase in the urge to urinate. In some, the urge to urinate is present almost always. Sometimes the urge is strong enough that you may not be able to stop yourself from urinating. The only real cure is to remove the stent and then give time for the bladder wall to heal which can't be done until the danger of the ureter swelling shut has passed, which varies. ? ?You may see some blood in your urine while the stent is in place and a few days afterwards. Do not be alarmed, even if the urine was clear for a while. Get off your feet and drink lots of fluids until clearing occurs. If you start to pass clots or don't improve, call us. ? ?DIET: ?You may return to your normal diet immediately. Because of the raw surface of your bladder, alcohol, spicy foods, acid type foods and drinks with caffeine may cause irritation or frequency and should be used in moderation. To keep your urine flowing freely and to avoid constipation, drink plenty of fluids during the day ( 8-10 glasses ). ?Tip: Avoid cranberry juice because it is very  acidic. ? ?ACTIVITY: ?Your physical activity doesn't need to be restricted. However, if you are very active, you may see some blood in your urine. We suggest that you reduce your activity under these circumstances until the bleeding has stopped. ? ?BOWELS: ?It is important to keep your bowels regular during the postoperative period. Straining with bowel movements can cause bleeding. A bowel movement every other day is reasonable. Use a mild laxative if needed, such as Milk of Magnesia 2-3 tablespoons, or 2 Dulcolax tablets. Call if you continue to have problems. If you have been taking narcotics for pain, before, during or after your surgery, you may be constipated. Take a laxative if necessary. ? ? ?MEDICATION: ?You should resume your pre-surgery medications unless told not to. In addition you will often be given an antibiotic to prevent infection. These should be taken as prescribed until the bottles are finished unless you are having an unusual reaction to one of the drugs. ? ?PROBLEMS YOU SHOULD REPORT TO US: ?Fevers over 100.5 Fahrenheit. ?Heavy bleeding, or clots ( See above notes about blood in urine ). ?Inability to urinate. ?Drug reactions ( hives, rash, nausea, vomiting, diarrhea ). ?Severe burning or pain with urination that is not improving. ? ?FOLLOW-UP: ?You will need a follow-up appointment to monitor your progress. Call for this appointment at the number listed above. Usually the first appointment will be about three to fourteen days after your surgery. ? ? ? ?  ?  Post Anesthesia Home Care Instructions ? ?Activity: ?Get plenty of rest for the remainder of the day. A responsible individual must stay with you for 24 hours following the procedure.  ?For the next 24 hours, DO NOT: ?-Drive a car ?-Operate machinery ?-Drink alcoholic beverages ?-Take any medication unless instructed by your physician ?-Make any legal decisions or sign important papers. ? ?Meals: ?Start with liquid foods such as gelatin or  soup. Progress to regular foods as tolerated. Avoid greasy, spicy, heavy foods. If nausea and/or vomiting occur, drink only clear liquids until the nausea and/or vomiting subsides. Call your physician if vomiting continues. ? ?Special Instructions/Symptoms: ?Your throat may feel dry or sore from the anesthesia or the breathing tube placed in your throat during surgery. If this causes discomfort, gargle with warm salt water. The discomfort should disappear within 24 hours. ? ? ?    ?

## 2022-09-19 NOTE — Op Note (Unsigned)
Tami Richards, JENISON MEDICAL RECORD NO: [id number] ACCOUNT NO: [id number] DATE OF BIRTH: 1935-04-19 FACILITY: [facility] LOCATION: [facility] PHYSICIAN: Sebastian Ache, MD  Operative Report   DATE OF PROCEDURE: 09/18/2022  PREOPERATIVE DIAGNOSES: 1.  Large right renal stone. 2.  History of retroperitoneal hematoma.  PROCEDURES PERFORMED:  1.  Cystoscopy, right retrograde pyelogram with interpretation. 2.  Right first stage ureteroscopy with laser lithotripsy. 3.  Exchange of right ureteral stent.  ESTIMATED BLOOD LOSS:  Nil.  COMPLICATIONS:  None.  SPECIMEN:  None.  FINDINGS: 1.  Significant interval resolution of prior retroperitoneal hematoma. 2.  Large lower pole renal stone estimated at 2-2.5 cm. 3.  Resolution or ablation approximately 65% of stone dusting technique today. 4.  Successful replacement of right ureteral stent.  INDICATIONS:  Tami Richards is an 88 year old lady who years ago during an episode of acute COVID infection developed a spontaneous retroperitoneal hematoma.  This was quite large and causing mass effect on her right kidney with obstruction.  She also has a  large nonobstructing renal stone.  She has been temporized with right ureteral stent.  It was then exchanged approximately every 6 months or so for several years while this retroperitoneal hematoma has slowly resolved.  On her most recent stent exchange,  it was noted that she had minimal ureteral deviation. Repeat axial imaging did corroborate essentially resolution of her hematoma after years of resorption.  We discussed options for management including recommended path of the goal of stone free with  addressing her large right renal stone prior and given the size of this, the staged ureteroscopy being most appropriate and she presents for first stage today.  Informed consent was obtained and placed in medical record.  PROCEDURE IN DETAIL:  The patient being Tami Richards verified and the  procedure being right first stage ureteroscopic stone manipulation was confirmed.  Procedure timeout was performed.  Intravenous antibiotics administered.  General anesthesia  introduced.  The patient was placed into a low lithotomy position.  Sterile field was created, prepped and draped the patient's vagina, introitus, and proximal thighs using iodine.  Cystourethroscopy was performed using 21-French rigid cystoscope with  offset lens.  Inspection of the urinary bladder revealed distal end of right ureteral stent in situ.  There was some mild to moderate encrustation.  It was removed, set aside for discard.  The right ureteral orifice was then cannulated with a 6-French  open-ended catheter, and right retrograde pyelogram was obtained.  Right pyelogram demonstrated single right ureter, single system right kidney.  There was some moderate hydronephrosis, right ureteronephrosis.  A very large stone in the lower pole of the kidney as anticipated.  A 0.038 ZIPwire was advanced to the level  of the upper pole  and set aside as a safety wire.  An 8-French feeding tube placed in the urinary bladder for pressure release and semirigid ureteroscopy was performed of the distal orifice of the right ureter alongside a separate sensor working wire.   No mucosal abnormalities were found.  Semirigid scope was exchanged for a short length ureteral access sheath to the level of the proximal ureter using continuous fluoroscopic guidance and flexible digital ureteroscopy was performed of the proximal  ureter and systematic inspection of the right kidney.  This patient has a very large renal stone.  This was in the lower pole of the calix, but not severely acutely angled.  This was much too large for simple basketing.  Estimated stone volume was at  least 2 cm.  Holmium laser energy was then applied to stone using escalating settings up to 0.3 joules and 53 Hz.  Using a dusting technique, it was estimated that approximately  70% of stone volume was ablated today.  After approximately 90 minutes plus  of dusting technique, there inherently was generated so much debris and stone dust that visualization became quite poor.  It was clearly felt that the safest means of management would be to place a stent, allow for interval resolution of the majority of  stone debris and second stage procedure as already planned.  As such, the access sheath was removed under continuous vision, no significant mucosal abnormalities were found and a new 5 x 24 Polaris type stent was placed from a safety wire using  fluoroscopic guidance.  Good proximal and distal planes were noted.  Procedure was terminated.  The patient tolerated procedure well, no immediate perioperative complications.  The patient taken to the postanesthesia care unit in stable condition.  Plan  for discharge home and proceed with second stage procedure in several weeks.   MUK D: 09/18/2022 3:08:52 pm T: 09/19/2022 1:27:00 am  JOB: [id number]/ [id number]

## 2022-09-20 NOTE — Anesthesia Postprocedure Evaluation (Signed)
Anesthesia Post Note  Patient: Water engineer  Procedure(s) Performed: FIRST STAGE CYSTOSCOPY WITH RETROGRADE PYELOGRAM, URETEROSCOPY AND STENT EXCHANGE (Right: Ureter) HOLMIUM LASER APPLICATION (Right: Ureter)     Patient location during evaluation: PACU Anesthesia Type: General Level of consciousness: awake and alert Pain management: pain level controlled Vital Signs Assessment: post-procedure vital signs reviewed and stable Respiratory status: spontaneous breathing, nonlabored ventilation, respiratory function stable and patient connected to nasal cannula oxygen Cardiovascular status: blood pressure returned to baseline and stable Postop Assessment: no apparent nausea or vomiting Anesthetic complications: no  No notable events documented.  Last Vitals:  Vitals:   09/18/22 1515 09/18/22 1550  BP: (!) 148/52 (!) 171/81  Pulse: 70 85  Resp: (!) 21 16  Temp:  36.6 C  SpO2: 100% 92%    Last Pain:  Vitals:   09/18/22 1515  TempSrc:   PainSc: 0-No pain                 Trevor Iha

## 2022-09-22 ENCOUNTER — Encounter (HOSPITAL_BASED_OUTPATIENT_CLINIC_OR_DEPARTMENT_OTHER): Payer: Self-pay | Admitting: Urology

## 2022-10-05 ENCOUNTER — Encounter (HOSPITAL_BASED_OUTPATIENT_CLINIC_OR_DEPARTMENT_OTHER): Payer: Self-pay | Admitting: Urology

## 2022-10-05 NOTE — Progress Notes (Signed)
Spoke w/ via phone for pre-op interview--- pt's grandson, Tami Richards needs dos----   [employer] (gent)            Richards results------ no COVID test -----patient states asymptomatic no test needed Arrive at ------- 1145 on 10-16-2022 NPO after MN NO Solid Food.  Clear liquids from MN until--- 1045 Med rec completed Medications to take morning of surgery ----- lipitor, synthroid, breztri inhaler Diabetic medication ----- n/a Patient instructed no nail polish to be worn day of surgery Patient instructed to bring photo id and insurance card day of surgery Patient aware to have Driver (ride ) / caregiver    for 24 hours after surgery -- grandson, Tami Patient Special Instructions ----- asked to bring rescue inhaler Pre-Op special Instructions ----- in special needs from office:  per dr Berneice Heinrich pt to continue asa. Pt's nephrologist, lov note , Dr Glenna Fellows, in chart. Patient verbalized understanding of instructions that were given at this phone interview. Patient denies shortness of breath, chest pain, fever, cough at this phone interview.

## 2022-10-16 ENCOUNTER — Encounter (HOSPITAL_BASED_OUTPATIENT_CLINIC_OR_DEPARTMENT_OTHER)
Admission: RE | Disposition: A | Discharge status: Home / Self Care | Payer: Self-pay | Source: Home / Self Care | Attending: Urology

## 2022-10-16 ENCOUNTER — Ambulatory Visit (HOSPITAL_BASED_OUTPATIENT_CLINIC_OR_DEPARTMENT_OTHER): Payer: 59 | Admitting: Certified Registered Nurse Anesthetist

## 2022-10-16 ENCOUNTER — Ambulatory Visit (HOSPITAL_BASED_OUTPATIENT_CLINIC_OR_DEPARTMENT_OTHER)
Admission: RE | Admit: 2022-10-16 | Discharge: 2022-10-16 | Disposition: A | Discharge status: Home / Self Care | Payer: 59 | Attending: Urology | Admitting: Urology

## 2022-10-16 ENCOUNTER — Other Ambulatory Visit: Payer: Self-pay

## 2022-10-16 DIAGNOSIS — N2581 Secondary hyperparathyroidism of renal origin: Secondary | ICD-10-CM | POA: Insufficient documentation

## 2022-10-16 DIAGNOSIS — D631 Anemia in chronic kidney disease: Secondary | ICD-10-CM | POA: Diagnosis not present

## 2022-10-16 DIAGNOSIS — E119 Type 2 diabetes mellitus without complications: Secondary | ICD-10-CM

## 2022-10-16 DIAGNOSIS — Z87891 Personal history of nicotine dependence: Secondary | ICD-10-CM | POA: Insufficient documentation

## 2022-10-16 DIAGNOSIS — E1122 Type 2 diabetes mellitus with diabetic chronic kidney disease: Secondary | ICD-10-CM | POA: Insufficient documentation

## 2022-10-16 DIAGNOSIS — I129 Hypertensive chronic kidney disease with stage 1 through stage 4 chronic kidney disease, or unspecified chronic kidney disease: Secondary | ICD-10-CM | POA: Diagnosis not present

## 2022-10-16 DIAGNOSIS — N184 Chronic kidney disease, stage 4 (severe): Secondary | ICD-10-CM | POA: Diagnosis not present

## 2022-10-16 DIAGNOSIS — J449 Chronic obstructive pulmonary disease, unspecified: Secondary | ICD-10-CM | POA: Insufficient documentation

## 2022-10-16 DIAGNOSIS — G4730 Sleep apnea, unspecified: Secondary | ICD-10-CM | POA: Insufficient documentation

## 2022-10-16 DIAGNOSIS — N200 Calculus of kidney: Secondary | ICD-10-CM

## 2022-10-16 DIAGNOSIS — I10 Essential (primary) hypertension: Secondary | ICD-10-CM | POA: Diagnosis not present

## 2022-10-16 DIAGNOSIS — E039 Hypothyroidism, unspecified: Secondary | ICD-10-CM | POA: Insufficient documentation

## 2022-10-16 DIAGNOSIS — Z87442 Personal history of urinary calculi: Secondary | ICD-10-CM | POA: Diagnosis not present

## 2022-10-16 DIAGNOSIS — N202 Calculus of kidney with calculus of ureter: Secondary | ICD-10-CM | POA: Diagnosis present

## 2022-10-16 DIAGNOSIS — K219 Gastro-esophageal reflux disease without esophagitis: Secondary | ICD-10-CM | POA: Diagnosis not present

## 2022-10-16 DIAGNOSIS — Z01818 Encounter for other preprocedural examination: Secondary | ICD-10-CM

## 2022-10-16 HISTORY — PX: HOLMIUM LASER APPLICATION: SHX5852

## 2022-10-16 HISTORY — PX: CYSTOSCOPY WITH RETROGRADE PYELOGRAM, URETEROSCOPY AND STENT PLACEMENT: SHX5789

## 2022-10-16 LAB — POCT I-STAT, CHEM 8
BUN: 30 mg/dL — ABNORMAL HIGH (ref 8–23)
Calcium, Ion: 1.31 mmol/L (ref 1.15–1.40)
Chloride: 108 mmol/L (ref 98–111)
Creatinine, Ser: 2.2 mg/dL — ABNORMAL HIGH (ref 0.44–1.00)
Glucose, Bld: 108 mg/dL — ABNORMAL HIGH (ref 70–99)
HCT: 39 % (ref 36.0–46.0)
Hemoglobin: 13.3 g/dL (ref 12.0–15.0)
Potassium: 3.9 mmol/L (ref 3.5–5.1)
Sodium: 142 mmol/L (ref 135–145)
TCO2: 24 mmol/L (ref 22–32)

## 2022-10-16 LAB — GLUCOSE, CAPILLARY
Glucose-Capillary: 100 mg/dL — ABNORMAL HIGH (ref 70–99)
Glucose-Capillary: 121 mg/dL — ABNORMAL HIGH (ref 70–99)

## 2022-10-16 SURGERY — CYSTOURETEROSCOPY, WITH RETROGRADE PYELOGRAM AND STENT INSERTION
Anesthesia: General | Site: Ureter | Laterality: Right

## 2022-10-16 MED ORDER — ONDANSETRON HCL 4 MG/2ML IJ SOLN
INTRAMUSCULAR | Status: DC | PRN
  Administered 2022-10-16: 4 mg via INTRAVENOUS

## 2022-10-16 MED ORDER — OXYCODONE HCL 5 MG/5ML PO SOLN: 5.0000 mg | Freq: Once | ORAL | Status: DC | PRN

## 2022-10-16 MED ORDER — IOHEXOL 300 MG/ML  SOLN
INTRAMUSCULAR | Status: DC | PRN
  Administered 2022-10-16: 20 mL via URETHRAL

## 2022-10-16 MED ORDER — EPHEDRINE 5 MG/ML INJ
INTRAVENOUS | Status: AC
  Filled 2022-10-16: qty 5

## 2022-10-16 MED ORDER — TRAMADOL HCL 50 MG PO TABS: 50.0000 mg | ORAL_TABLET | Freq: Four times a day (QID) | ORAL | 0 refills | Status: AC | PRN

## 2022-10-16 MED ORDER — DEXAMETHASONE SODIUM PHOSPHATE 10 MG/ML IJ SOLN
INTRAMUSCULAR | Status: AC
  Filled 2022-10-16: qty 1

## 2022-10-16 MED ORDER — PROPOFOL 10 MG/ML IV BOLUS
INTRAVENOUS | Status: DC | PRN
  Administered 2022-10-16: 30 mg via INTRAVENOUS
  Administered 2022-10-16: 100 mg via INTRAVENOUS
  Administered 2022-10-16: 30 mg via INTRAVENOUS

## 2022-10-16 MED ORDER — SODIUM CHLORIDE 0.9 % IR SOLN
Status: DC | PRN
  Administered 2022-10-16 (×2): 3000 mL

## 2022-10-16 MED ORDER — 0.9 % SODIUM CHLORIDE (POUR BTL) OPTIME
TOPICAL | Status: DC | PRN
  Administered 2022-10-16: 500 mL

## 2022-10-16 MED ORDER — FENTANYL CITRATE (PF) 100 MCG/2ML IJ SOLN
INTRAMUSCULAR | Status: DC | PRN
  Administered 2022-10-16 (×2): 25 ug via INTRAVENOUS
  Administered 2022-10-16: 50 ug via INTRAVENOUS

## 2022-10-16 MED ORDER — ONDANSETRON HCL 4 MG/2ML IJ SOLN
INTRAMUSCULAR | Status: AC
  Filled 2022-10-16: qty 2

## 2022-10-16 MED ORDER — FENTANYL CITRATE (PF) 100 MCG/2ML IJ SOLN
INTRAMUSCULAR | Status: AC
  Filled 2022-10-16: qty 2

## 2022-10-16 MED ORDER — ONDANSETRON HCL 4 MG/2ML IJ SOLN: 4.0000 mg | Freq: Four times a day (QID) | INTRAMUSCULAR | Status: DC | PRN

## 2022-10-16 MED ORDER — LIDOCAINE 2% (20 MG/ML) 5 ML SYRINGE
INTRAMUSCULAR | Status: DC | PRN
  Administered 2022-10-16: 60 mg via INTRAVENOUS

## 2022-10-16 MED ORDER — CEPHALEXIN 500 MG PO CAPS: 500.0000 mg | ORAL_CAPSULE | Freq: Two times a day (BID) | ORAL | 0 refills | Status: AC

## 2022-10-16 MED ORDER — PROPOFOL 10 MG/ML IV BOLUS
INTRAVENOUS | Status: AC
  Filled 2022-10-16: qty 20

## 2022-10-16 MED ORDER — FENTANYL CITRATE (PF) 100 MCG/2ML IJ SOLN: 25.0000 ug | INTRAMUSCULAR | Status: DC | PRN

## 2022-10-16 MED ORDER — OXYCODONE HCL 5 MG PO TABS: 5.0000 mg | ORAL_TABLET | Freq: Once | ORAL | Status: DC | PRN

## 2022-10-16 MED ORDER — SODIUM CHLORIDE 0.9 % IV SOLN: INTRAVENOUS | Status: DC

## 2022-10-16 MED ORDER — GENTAMICIN SULFATE 40 MG/ML IJ SOLN
5.0000 mg/kg | INTRAVENOUS | Status: AC
  Administered 2022-10-16: 330 mg via INTRAVENOUS
  Filled 2022-10-16: qty 8.25

## 2022-10-16 MED ORDER — DEXAMETHASONE SODIUM PHOSPHATE 10 MG/ML IJ SOLN
INTRAMUSCULAR | Status: DC | PRN
  Administered 2022-10-16: 5 mg via INTRAVENOUS

## 2022-10-16 MED ORDER — EPHEDRINE SULFATE-NACL 50-0.9 MG/10ML-% IV SOSY
PREFILLED_SYRINGE | INTRAVENOUS | Status: DC | PRN
  Administered 2022-10-16: 5 mg via INTRAVENOUS
  Administered 2022-10-16: 10 mg via INTRAVENOUS

## 2022-10-16 SURGICAL SUPPLY — 29 items
BAG DRAIN URO-CYSTO SKYTR STRL (DRAIN) ×2 IMPLANT
BAG DRN UROCATH (DRAIN) ×1
BASKET LASER NITINOL 1.9FR (BASKET) IMPLANT
BSKT STON RTRVL 120 1.9FR (BASKET) ×1
CATH URETL OPEN END 6FR 70 (CATHETERS) IMPLANT
CLOTH BEACON ORANGE TIMEOUT ST (SAFETY) ×2 IMPLANT
COVER DOME SNAP 22 D (MISCELLANEOUS) IMPLANT
GLOVE BIO SURGEON STRL SZ7.5 (GLOVE) ×2 IMPLANT
GLOVE BIOGEL PI IND STRL 6.5 (GLOVE) IMPLANT
GLOVE SURG SS PI 6.5 STRL IVOR (GLOVE) IMPLANT
GOWN STRL REUS W/ TWL LRG LVL3 (GOWN DISPOSABLE) IMPLANT
GOWN STRL REUS W/TWL LRG LVL3 (GOWN DISPOSABLE) ×3 IMPLANT
GUIDEWIRE ANG ZIPWIRE 038X150 (WIRE) ×2 IMPLANT
GUIDEWIRE STR DUAL SENSOR (WIRE) ×2 IMPLANT
IV NS IRRIG 3000ML ARTHROMATIC (IV SOLUTION) ×2 IMPLANT
KIT TURNOVER CYSTO (KITS) ×2 IMPLANT
MANIFOLD NEPTUNE II (INSTRUMENTS) ×2 IMPLANT
NS IRRIG 500ML POUR BTL (IV SOLUTION) ×2 IMPLANT
PACK CYSTO (CUSTOM PROCEDURE TRAY) ×2 IMPLANT
SHEATH NAVIGATOR HD 11/13X28 (SHEATH) IMPLANT
SLEEVE SCD COMPRESS KNEE MED (STOCKING) ×2 IMPLANT
STENT POLARIS 5FRX24 (STENTS) IMPLANT
SYR 10ML LL (SYRINGE) ×2 IMPLANT
SYR CONTROL 10ML LL (SYRINGE) IMPLANT
TRACTIP FLEXIVA PULS ID 200XHI (Laser) IMPLANT
TRACTIP FLEXIVA PULSE ID 200 (Laser) ×1
TUBE CONNECTING 12X1/4 (SUCTIONS) ×2 IMPLANT
TUBE FEEDING 8FR 16IN STR KANG (MISCELLANEOUS) IMPLANT
TUBING UROLOGY SET (TUBING) ×2 IMPLANT

## 2022-10-16 NOTE — H&P (Signed)
Tami Richards is an 88 y.o. female.    Chief Complaint: Pre-OP RIGHT 2nd stage Ureteroscopic Stone Manipulation  HPI:   1 - Right Hydronephrosis 2/2 Retroperitoneal Hematoma- moderate right hydro by CT 01/2020 on restaging of retroperitoneal hematoma that developes while anticoagulated for COVID treatment. Recived 4u blood and Hgb subsequently stabilzed. Rt kidney dominant. Ipsilateral non-osbtructing stone.   Recent Course:  07/2020 - exchange 5x24 Polaris stent, still major obstruction from hematoma;  06/2021 - exchange 5x24 Polaris stent, still major obstruction from hematoma;  01/2022 - exchange 5x24 Polaris stent, slightly improved obstruction from hematoma;  07/2022 - CT resolution of prior hematoma, Rt renal stoen abtou 2.5cm persists     2 - Stage 4 renal insufficiency / Left Renal Atrophy - Cr 1.5-2 at baseline. CT 01/2020 with non-osbtructing Rt renal pelvis stone and left renal atrohpy. Cr now 1.4.     3 - Large Right Renal Stone - 1.6cm incidental Rt renal pelvis stone non-osbtructing on CT 2021. Several prior episodes managed medically. Enalrged to 2.5cm by 2024. 1st stage ureteroscopy on 09/18/22.        PMH sig for C19(unvaccinated), LBBB (follows [facility] Man MD cards) Lives with grandsons at baseline but indepenant in all ADL's. Her daughter Misty Stanley at [phone] is very involved and lives locally. Her PCP is Dr. Thornell Mule.     Today "Tami Richards" is seen to proceed with second stage RIGHT ureteroscopic stone manipulation. NO interval fevers.   Past Medical History:  Diagnosis Date   Anemia associated with chronic renal failure    Atherosclerosis of aorta (HCC)    aortoiliac   Atrophy of left kidney    Carotid stenosis, asymptomatic, bilateral    per duplex in epic 07-05-2020  bilateral ICA 1-39%   CKD (chronic kidney disease), stage IV (HCC)    followed by nephrologist at [facility]-- dr Glenna Fellows   COPD (chronic obstructive pulmonary disease) [facility])     pulmonology--- dr wert   Depression    DOE (dyspnea on exertion)    per pt  normal due to copd   Full dentures    GERD (gastroesophageal reflux disease)    Hiatal hernia    History of hypertension    currently not taking medication since admission 09/ 2021 with covid   History of kidney stones    Hypothyroidism    followed by pcp   LBBB (left bundle branch block)    per ekg in epic 02-05-2020  (evaluated by cardiologist-- schumann 06-07-2020)   Lumbar compression fracture (HCC) 01/2017   L1   Mild sleep apnea    per pt yrs ago tested was told very mild , no cpap recommended   Obstruction of right ureter    Renal calculus, right    large   Secondary hyperparathyroidism of renal origin (HCC)    Type 2 diabetes mellitus (HCC)    followed by pcp---  ( per pt does not check sugars at home, stated pcp told she did not need too any more)   Wears glasses    Wears hearing aid in both ears     Past Surgical History:  Procedure Laterality Date   CATARACT EXTRACTION W/ INTRAOCULAR LENS IMPLANT Left 02/14/2020   CYSTOSCOPY W/ URETERAL STENT PLACEMENT Right 02/18/2020   Procedure: CYSTOSCOPY WITH RETROGRADE PYELOGRAM/URETERAL STENT PLACEMENT;  Surgeon: Sebastian Ache, MD;  Location: [facility];  Service: Urology;  Laterality: Right;   CYSTOSCOPY W/ URETERAL STENT PLACEMENT Right 06/20/2021   Procedure: CYSTOSCOPY WITH RETROGRADE PYELOGRAM/URETERAL  STENT EXCHANGE;  Surgeon: Sebastian Ache, MD;  Location: [facility];  Service: Urology;  Laterality: Right;   CYSTOSCOPY W/ URETERAL STENT PLACEMENT Right 02/13/2022   Procedure: CYSTOSCOPY WITH RETROGRADE PYELOGRAM/URETERAL STENT REPLACEMENT;  Surgeon: Sebastian Ache, MD;  Location: [facility];  Service: Urology;  Laterality: Right;   CYSTOSCOPY WITH RETROGRADE PYELOGRAM, URETEROSCOPY AND STENT PLACEMENT Right 07/31/2020   Procedure: CYSTOSCOPY WITH RETROGRADE PYELOGRAM, DIAGNOSTIC URETEROSCOPY AND STENT EXCHANGE;   Surgeon: Sebastian Ache, MD;  Location: [facility];  Service: Urology;  Laterality: Right;  90 MINS   CYSTOSCOPY WITH RETROGRADE PYELOGRAM, URETEROSCOPY AND STENT PLACEMENT Right 09/18/2022   Procedure: FIRST STAGE CYSTOSCOPY WITH RETROGRADE PYELOGRAM, URETEROSCOPY AND STENT EXCHANGE;  Surgeon: Loletta Parish., MD;  Location: [facility];  Service: Urology;  Laterality: Right;   HOLMIUM LASER APPLICATION Right 09/18/2022   Procedure: HOLMIUM LASER APPLICATION;  Surgeon: Loletta Parish., MD;  Location: [facility];  Service: Urology;  Laterality: Right;   URETEROSCOPY WITH HOLMIUM LASER LITHOTRIPSY  2002;  2008    Family History  Problem Relation Age of Onset   Hypertension Other    Social History:  reports that she quit smoking about 15 years ago. Her smoking use included cigarettes. She has never used smokeless tobacco. She reports that she does not drink alcohol and does not use drugs.  Allergies:  Allergies  Allergen Reactions   Apple Juice Other (See Comments)    Allergy test/ patient states she still eats it   Beef-Derived Products Other (See Comments)    Allergy test/ patient states she still eats it.   Chocolate Other (See Comments)    Allergy test / patient states she still eats it.   Egg-Derived Products Other (See Comments)    Allergy test / patient states she still eats it    No medications prior to admission.    No results found for this or any previous visit (from the past 48 hour(s)). No results found.  Review of Systems  Constitutional:  Negative for chills and fever.  Genitourinary:  Positive for urgency.  All other systems reviewed and are negative.   Height 5\' 6"  (1.676 m), weight 66 kg. Physical Exam Vitals reviewed.  Constitutional:      Comments: At baseline. Stable salty but endearing demeanor.   HENT:     Head: Normocephalic.  Eyes:     Pupils: Pupils are equal, round, and reactive to light.  Cardiovascular:      Rate and Rhythm: Normal rate.  Pulmonary:     Effort: Pulmonary effort is normal.  Abdominal:     General: Abdomen is flat.  Genitourinary:    Comments: NO CVAT Musculoskeletal:        General: Normal range of motion.     Cervical back: Normal range of motion.  Skin:    General: Skin is warm.  Neurological:     General: No focal deficit present.     Mental Status: She is alert.  Psychiatric:        Mood and Affect: Mood normal.      Assessment/Plan  Proceed as planned with RIGHT 2nd stage ureteroscopic stone manipulation. Risks, benefits, alternatives, expected peri-op course discussed previously and reiterated today.   Loletta Parish., MD 10/16/2022, 6:57 AM

## 2022-10-16 NOTE — Anesthesia Preprocedure Evaluation (Signed)
Anesthesia Evaluation  Patient identified by MRN, date of birth, ID band Patient awake    Reviewed: Allergy & Precautions, H&P , NPO status , Patient's Chart, lab work & pertinent test results  Airway Mallampati: II   Neck ROM: full    Dental   Pulmonary sleep apnea , COPD, former smoker   breath sounds clear to auscultation       Cardiovascular hypertension, + dysrhythmias  Rhythm:regular Rate:Normal     Neuro/Psych  PSYCHIATRIC DISORDERS  Depression     Neuromuscular disease    GI/Hepatic hiatal hernia,GERD  ,,  Endo/Other  diabetes, Type 2Hypothyroidism    Renal/GU Renal InsufficiencyRenal disease     Musculoskeletal   Abdominal   Peds  Hematology   Anesthesia Other Findings   Reproductive/Obstetrics                             Anesthesia Physical Anesthesia Plan  ASA: 3  Anesthesia Plan: General   Post-op Pain Management:    Induction: Intravenous  PONV Risk Score and Plan: 3 and Ondansetron, Dexamethasone and Treatment may vary due to age or medical condition  Airway Management Planned: LMA  Additional Equipment:   Intra-op Plan:   Post-operative Plan:   Informed Consent: I have reviewed the patients History and Physical, chart, labs and discussed the procedure including the risks, benefits and alternatives for the proposed anesthesia with the patient or authorized representative who has indicated his/her understanding and acceptance.     Dental advisory given  Plan Discussed with: CRNA, Anesthesiologist and Surgeon  Anesthesia Plan Comments:        Anesthesia Quick Evaluation

## 2022-10-16 NOTE — Anesthesia Procedure Notes (Signed)
Procedure Name: LMA Insertion Date/Time: 10/16/2022 1:39 PM  Performed by: Bishop Limbo, CRNAPre-anesthesia Checklist: Patient identified, Emergency Drugs available, Suction available and Patient being monitored Patient Re-evaluated:Patient Re-evaluated prior to induction Oxygen Delivery Method: Circle System Utilized Preoxygenation: Pre-oxygenation with 100% oxygen Induction Type: IV induction Ventilation: Mask ventilation without difficulty LMA: LMA inserted LMA Size: 4.0 Number of attempts: 1 Placement Confirmation: positive ETCO2 Tube secured with: Tape Dental Injury: Teeth and Oropharynx as per pre-operative assessment

## 2022-10-16 NOTE — Transfer of Care (Signed)
Immediate Anesthesia Transfer of Care Note  Patient: Tami Richards  Procedure(s) Performed: SECOND STAGE CYSTOSCOPY WITH RETROGRADE PYELOGRAM, URETEROSCOPY AND STENT EXCHANGE (Right: Ureter) HOLMIUM LASER APPLICATION (Right: Ureter)  Patient Location: PACU  Anesthesia Type:General  Level of Consciousness: drowsy, patient cooperative, and responds to stimulation  Airway & Oxygen Therapy: Patient Spontanous Breathing  Post-op Assessment: Report given to RN and Post -op Vital signs reviewed and stable  Post vital signs: Reviewed and stable  Last Vitals:  Vitals Value Taken Time  BP 151/60 10/16/22 1454  Temp    Pulse 84 10/16/22 1459  Resp 16 10/16/22 1459  SpO2 91 % 10/16/22 1459  Vitals shown include unvalidated device data.  Last Pain:  Vitals:   10/16/22 1223  TempSrc: Oral  PainSc: 0-No pain         Complications: No notable events documented.

## 2022-10-16 NOTE — OR Nursing (Signed)
Right ureteral stent was removed by Dr. Manny and discarded. 

## 2022-10-16 NOTE — Op Note (Signed)
Tami Richards, Tami Richards MEDICAL RECORD NO: [id number] ACCOUNT NO: [id number] DATE OF BIRTH: [birth year] FACILITY: [facility] LOCATION: [facility] PHYSICIAN: Sebastian Ache, MD  Operative Report   DATE OF PROCEDURE: 10/16/2022  PREOPERATIVE DIAGNOSIS:  Residual right renal and ureteral stone status post first stage procedure.  PROCEDURE PERFORMED:   1.  Cystoscopy right retrograde pyelogram interpretation. 2.  Right ureteroscopy with laser lithotripsy, second stage. 3.  Exchange of right ureteral stent.  ESTIMATED BLOOD LOSS:  Nil.  COMPLICATIONS:  None.  SPECIMEN:  Residual right renal and ureteral stone fragments given to patient.  FINDINGS:   1.  Approximately 1 cm2 total volume residual stone.  Approximately 50% lower pole kidney, 50% ureter. 2.  Complete resolution of all accessible stone fragments larger than one-third mm following laser lithotripsy and basket extraction. 3.  Successful replacement, right ureteral stent, proximal end in the renal pelvis, distal in urinary bladder, with tether.  INDICATIONS:  The patient is a very pleasant 88 year old lady with history of retroperitoneal hematoma that happened as a result of the COVID infection.  This caused mass effect on the right ureter and she has been managed with chronic stenting since  then, changed approximately every 6 months.  Unfortunately, with time, her hematoma resolved.  However, she also has a significant right renal stone that was originally 2.5 cm as well.  Options discussed for management of that and she wished to proceed  with staged ureteroscopy.  She underwent first stage procedure several weeks ago, at which point majority of her stone was dusted.  Given the very large stone it was clearly felt that second stage procedure was warranted.  She presents for this today.   Informed consent was obtained and placed in medical record.  PROCEDURE IN DETAIL:  The patient being identified and verified. Procedure  being second stage right ureteroscopic stone manipulation was confirmed.  Procedure timeout was performed.  Intravenous antibiotics were administered.  General anesthesia was  induced.  The patient was placed into a low lithotomy position.  Sterile field was created, prepped and draped the patient's vagina, introitus, and proximal thighs using iodine.  Cystourethroscopy was performed using 21-French rigid cystoscope with  offset lens.  Inspection of urinary bladder revealed distal end of the stent in situ.  It was grasped, removed in its entirety set aside for discard.  It was inspected and intact.  Right ureteral orifice was cannulated with 6-French end-hole catheter,  and right retrograde pyelogram was obtained.  Right retrograde pyelogram demonstrated single right ureter, single system right kidney.  Multiple filling defects in the ureter consistent with likely stone fragments.  A ZIPwire was advanced to the level of the upper poles set aside as a safety wire.   An 8-French feeding tube placed in the urinary bladder for pressure release and semirigid ureteroscopy was performed of the right ureter alongside a separate sensory working wire.  As anticipated, there were numerous stones within the ureter.  These were  cleared out sequentially distal to proximal fashion, one at a time, repositioning stones to the level of the urinary bladder one by one.  This cleared the distal orifice of the right ureter.  Semirigid scope was exchanged for a short length ureteral  access sheath to the level of the proximal ureter using continuous fluoroscopic guidance over a sensory working wire. Flexible digital ureteroscopy was performed proximal right ureter and systematic inspection of the right kidney with all calyces x3.   There were two additional dominant calcifications in the very  most proximal ureter.  They were repositioned into a lower mid calyx where there were actually several other stone fragments as well.   Most of these were amenable to simple basketing.   However, there were three dominant fragments that did require holmium laser energy using settings of 0.2 joules and 20 Hz.  Then, all fragments were amenable to simple basketing.  Following this, complete resolution of all accessible stone fragments  larger than one-third mm in the kidney.  We achieved the goals of the procedure today.  Access sheath was removed under continuous vision, no significant gross abnormalities were found.  Given access sheath usage, it was felt that brief interval stenting  with tethered stent would be most prudent.  As such, a new 5 x 22 Polaris type stent was placed over remaining safety wire using fluoroscopic guidance.  Good proximal and distal planes were noted.  Stone fragments in the bladder irrigated out.  Tether  was left in place, trimmed to length and tucked per vagina and the procedure was terminated.  The patient tolerated procedure well, no immediate perioperative complications.  The patient was taken to postanesthesia care in stable condition.  Plan for  discharge home.   PUS D: 10/16/2022 2:52:22 pm T: 10/16/2022 3:41:00 pm  JOB: [id number]/ [id number]

## 2022-10-16 NOTE — Discharge Instructions (Addendum)
1 - You may have urinary urgency (bladder spasms) and bloody urine on / off with stent in place. This is normal.  2 - Remove tethered stent on Monday morning at home by pulling string, then blue-white plastic tubing, and discarding. Office is open Monday if issues arise.   3 - Call MD or go to ER for fever >102, severe pain / nausea / vomiting not relieved by medications, or acute change in medical status         [facility] [phone] Post Ureteroscopy With or Without Stent Instructions  Definitions:  Ureter: The duct that transports urine from the kidney to the bladder. Stent:   A plastic hollow tube that is placed into the ureter, from the kidney to the bladder to prevent the ureter from swelling shut.  GENERAL INSTRUCTIONS:  Despite the fact that no skin incisions were used, the area around the ureter and bladder is raw and irritated. The stent is a foreign body which will further irritate the bladder wall. This irritation is manifested by increased frequency of urination, both day and night, and by an increase in the urge to urinate. In some, the urge to urinate is present almost always. Sometimes the urge is strong enough that you may not be able to stop yourself from urinating. The only real cure is to remove the stent and then give time for the bladder wall to heal which can't be done until the danger of the ureter swelling shut has passed, which varies.  You may see some blood in your urine while the stent is in place and a few days afterwards. Do not be alarmed, even if the urine was clear for a while. Get off your feet and drink lots of fluids until clearing occurs. If you start to pass clots or don't improve, call us.  DIET: You may return to your normal diet immediately. Because of the raw surface of your bladder, alcohol, spicy foods, acid type foods and drinks with caffeine may cause irritation or frequency and should be used in moderation. To keep your  urine flowing freely and to avoid constipation, drink plenty of fluids during the day ( 8-10 glasses ). Tip: Avoid cranberry juice because it is very acidic.  ACTIVITY: Your physical activity doesn't need to be restricted. However, if you are very active, you may see some blood in your urine. We suggest that you reduce your activity under these circumstances until the bleeding has stopped.  BOWELS: It is important to keep your bowels regular during the postoperative period. Straining with bowel movements can cause bleeding. A bowel movement every other day is reasonable. Use a mild laxative if needed, such as Milk of Magnesia 2-3 tablespoons, or 2 Dulcolax tablets. Call if you continue to have problems. If you have been taking narcotics for pain, before, during or after your surgery, you may be constipated. Take a laxative if necessary.   MEDICATION: You should resume your pre-surgery medications unless told not to. In addition you will often be given an antibiotic to prevent infection. These should be taken as prescribed until the bottles are finished unless you are having an unusual reaction to one of the drugs.  PROBLEMS YOU SHOULD REPORT TO [place]: Fevers over 100.5 Fahrenheit. Heavy bleeding, or clots ( See above notes about blood in urine ). Inability to urinate. Drug reactions ( hives, rash, nausea, vomiting, diarrhea ). Severe burning or pain with urination that is not improving.  FOLLOW-UP: You will need a  follow-up appointment to monitor your progress. Call for this appointment at the number listed above. Usually the first appointment will be about three to fourteen days after your surgery.     Post Anesthesia Home Care Instructions  Activity: Get plenty of rest for the remainder of the day. A responsible individual must stay with you for 24 hours following the procedure.  For the next 24 hours, DO NOT: -Drive a car -Advertising copywriter -Drink alcoholic beverages -Take any  medication unless instructed by your physician -Make any legal decisions or sign important papers.  Meals: Start with liquid foods such as gelatin or soup. Progress to regular foods as tolerated. Avoid greasy, spicy, heavy foods. If nausea and/or vomiting occur, drink only clear liquids until the nausea and/or vomiting subsides. Call your physician if vomiting continues.  Special Instructions/Symptoms: Your throat may feel dry or sore from the anesthesia or the breathing tube placed in your throat during surgery. If this causes discomfort, gargle with warm salt water. The discomfort should disappear within 24 hours.

## 2022-10-16 NOTE — Brief Op Note (Signed)
10/16/2022  2:47 PM  PATIENT:  Natale Lay  88 y.o. female  PRE-OPERATIVE DIAGNOSIS:  RIGHT RENAL STONE  POST-OPERATIVE DIAGNOSIS:  RIGHT RENAL STONE  PROCEDURE:  Procedure(s): SECOND STAGE CYSTOSCOPY WITH RETROGRADE PYELOGRAM, URETEROSCOPY AND STENT EXCHANGE (Right) HOLMIUM LASER APPLICATION (Right)  SURGEON:  Surgeon(s) and Role:    * Mickie Badders, [name]., MD - Primary  PHYSICIAN ASSISTANT:   ASSISTANTS: none   ANESTHESIA:   general  EBL:  minimal   BLOOD ADMINISTERED:none  DRAINS: none   LOCAL MEDICATIONS USED:  NONE  SPECIMEN:  Source of Specimen:  Residual Rt ureteral / renal stone fragments  DISPOSITION OF SPECIMEN:   given to patient  COUNTS:  YES  TOURNIQUET:  * No tourniquets in log *  DICTATION: .Other Dictation: Dictation Number [id number]  PLAN OF CARE: Discharge to home after PACU  PATIENT DISPOSITION:  PACU - hemodynamically stable.   Delay start of Pharmacological VTE agent (>24hrs) due to surgical blood loss or risk of bleeding: not applicable

## 2022-10-19 ENCOUNTER — Encounter (HOSPITAL_BASED_OUTPATIENT_CLINIC_OR_DEPARTMENT_OTHER): Payer: Self-pay | Admitting: Urology

## 2022-10-19 NOTE — Anesthesia Postprocedure Evaluation (Addendum)
Anesthesia Post Note  Patient: Water engineer  Procedure(s) Performed: SECOND STAGE CYSTOSCOPY WITH RETROGRADE PYELOGRAM, URETEROSCOPY AND STENT EXCHANGE (Right: Ureter) HOLMIUM LASER APPLICATION (Right: Ureter)     Patient location during evaluation: PACU Anesthesia Type: General Level of consciousness: awake and alert Pain management: pain level controlled Vital Signs Assessment: post-procedure vital signs reviewed and stable Respiratory status: spontaneous breathing, nonlabored ventilation, respiratory function stable and patient connected to nasal cannula oxygen Cardiovascular status: blood pressure returned to baseline and stable Postop Assessment: no apparent nausea or vomiting Anesthetic complications: no   No notable events documented.  Last Vitals:  Vitals:   10/16/22 1530 10/16/22 1556  BP: 95/71 (!) 175/54  Pulse: 79 77  Resp: 14 15  Temp:  (!) 36.4 C  SpO2: 93% 93%    Last Pain:  Vitals:   10/16/22 1556  TempSrc: Oral  PainSc: 0-No pain                 Tami Richards S

## 2023-12-23 ENCOUNTER — Encounter (HOSPITAL_COMMUNITY): Payer: Self-pay

## 2023-12-23 ENCOUNTER — Encounter (HOSPITAL_COMMUNITY)

## 2023-12-30 ENCOUNTER — Other Ambulatory Visit (HOSPITAL_COMMUNITY): Payer: Self-pay | Admitting: Internal Medicine

## 2023-12-30 ENCOUNTER — Ambulatory Visit (HOSPITAL_COMMUNITY)
Admission: RE | Admit: 2023-12-30 | Discharge: 2023-12-30 | Disposition: A | Discharge status: Home / Self Care | Source: Ambulatory Visit | Attending: Vascular Surgery | Admitting: Vascular Surgery

## 2023-12-30 DIAGNOSIS — R600 Localized edema: Secondary | ICD-10-CM

## 2024-01-05 ENCOUNTER — Other Ambulatory Visit: Payer: Self-pay

## 2024-01-05 DIAGNOSIS — I7143 Infrarenal abdominal aortic aneurysm, without rupture: Secondary | ICD-10-CM

## 2024-02-08 NOTE — Progress Notes (Unsigned)
 Office Note     CC: 3.5 cm AAA 2021 Requesting Provider:  Valentin Skates, DO  HPI: Tami Richards is a 89 y.o. (08-03-1934) female presenting at the request of .Valentin Skates, DO to establish care with a known 3.5 cm AAA which was last imaged in 2021.  The pt is *** on a statin for cholesterol management.  The pt is *** on a daily aspirin .   Other AC:  *** The pt is *** on medication for hypertension.   The pt is *** diabetic.  Tobacco hx:  ***  Past Medical History:  Diagnosis Date   Anemia associated with chronic renal failure    Atherosclerosis of aorta (HCC)    aortoiliac   Atrophy of left kidney    Carotid stenosis, asymptomatic, bilateral    per duplex in epic 07-05-2020  bilateral ICA 1-39%   CKD (chronic kidney disease), stage IV (HCC)    followed by nephrologist at [facility]-- dr norine   COPD (chronic obstructive pulmonary disease) [facility])    pulmonology--- dr wert   Depression    DOE (dyspnea on exertion)    per pt  normal due to copd   Full dentures    GERD (gastroesophageal reflux disease)    Hiatal hernia    History of hypertension    currently not taking medication since admission 09/ 2021 with covid   History of kidney stones    Hypothyroidism    followed by pcp   LBBB (left bundle branch block)    per ekg in epic 02-05-2020  (evaluated by cardiologist-- schumann 06-07-2020)   Lumbar compression fracture (HCC) 01/2017   L1   Mild sleep apnea    per pt yrs ago tested was told very mild , no cpap recommended   Obstruction of right ureter    Renal calculus, right    large   Secondary hyperparathyroidism of renal origin (HCC)    Type 2 diabetes mellitus (HCC)    followed by pcp---  ( per pt does not check sugars at home, stated pcp told she did not need too any more)   Wears glasses    Wears hearing aid in both ears     Past Surgical History:  Procedure Laterality Date   CATARACT EXTRACTION W/ INTRAOCULAR LENS IMPLANT Left 02/14/2020    CYSTOSCOPY W/ URETERAL STENT PLACEMENT Right 02/18/2020   Procedure: CYSTOSCOPY WITH RETROGRADE PYELOGRAM/URETERAL STENT PLACEMENT;  Surgeon: Alvaro Hummer, MD;  Location: [facility];  Service: Urology;  Laterality: Right;   CYSTOSCOPY W/ URETERAL STENT PLACEMENT Right 06/20/2021   Procedure: CYSTOSCOPY WITH RETROGRADE PYELOGRAM/URETERAL STENT EXCHANGE;  Surgeon: Alvaro Hummer, MD;  Location: [facility];  Service: Urology;  Laterality: Right;   CYSTOSCOPY W/ URETERAL STENT PLACEMENT Right 02/13/2022   Procedure: CYSTOSCOPY WITH RETROGRADE PYELOGRAM/URETERAL STENT REPLACEMENT;  Surgeon: Alvaro Hummer, MD;  Location: [facility];  Service: Urology;  Laterality: Right;   CYSTOSCOPY WITH RETROGRADE PYELOGRAM, URETEROSCOPY AND STENT PLACEMENT Right 07/31/2020   Procedure: CYSTOSCOPY WITH RETROGRADE PYELOGRAM, DIAGNOSTIC URETEROSCOPY AND STENT EXCHANGE;  Surgeon: Alvaro Hummer, MD;  Location: [facility];  Service: Urology;  Laterality: Right;  90 MINS   CYSTOSCOPY WITH RETROGRADE PYELOGRAM, URETEROSCOPY AND STENT PLACEMENT Right 09/18/2022   Procedure: FIRST STAGE CYSTOSCOPY WITH RETROGRADE PYELOGRAM, URETEROSCOPY AND STENT EXCHANGE;  Surgeon: Alvaro Hummer KATHEE Mickey., MD;  Location: [facility];  Service: Urology;  Laterality: Right;   CYSTOSCOPY WITH RETROGRADE PYELOGRAM, URETEROSCOPY AND STENT PLACEMENT Right 10/16/2022  Procedure: SECOND STAGE CYSTOSCOPY WITH RETROGRADE PYELOGRAM, URETEROSCOPY AND STENT EXCHANGE;  Surgeon: Alvaro Ricardo KATHEE Mickey., MD;  Location: [facility];  Service: Urology;  Laterality: Right;   HOLMIUM LASER APPLICATION Right 09/18/2022   Procedure: HOLMIUM LASER APPLICATION;  Surgeon: Alvaro Ricardo KATHEE Mickey., MD;  Location: [facility];  Service: Urology;  Laterality: Right;   HOLMIUM LASER APPLICATION Right 10/16/2022   Procedure: HOLMIUM LASER APPLICATION;  Surgeon: Alvaro Ricardo KATHEE Mickey., MD;  Location: [facility];  Service: Urology;  Laterality: Right;   URETEROSCOPY WITH HOLMIUM LASER LITHOTRIPSY  2002;  2008    Social History   Socioeconomic History   Marital status: Widowed    Spouse name: Not on file   Number of children: Not on file   Years of education: Not on file   Highest education level: Not on file  Occupational History   Not on file  Tobacco Use   Smoking status: Former    Current packs/day: 0.00    Types: Cigarettes    Start date: 05/15/1952    Quit date: 05/16/2007    Years since quitting: 16.7   Smokeless tobacco: Never  Vaping Use   Vaping status: Never Used  Substance and Sexual Activity   Alcohol use: No   Drug use: Never   Sexual activity: Not on file  Other Topics Concern   Not on file  Social History Narrative   Not on file   Social Drivers of Health   Financial Resource Strain: Not on file  Food Insecurity: Not on file  Transportation Needs: Not on file  Physical Activity: Not on file  Stress: Not on file  Social Connections: Not on file  Intimate Partner Violence: Not on file   *** Family History  Problem Relation Age of Onset   Hypertension Other     Current Outpatient Medications  Medication Sig Dispense Refill   acetaminophen  (TYLENOL ) 500 MG tablet Take 500 mg by mouth as needed for mild pain or moderate pain.     Albuterol  Sulfate, sensor, (PROAIR  DIGIHALER) 108 (90 Base) MCG/ACT AEPB Inhale 2 puffs into the lungs every 4 (four) hours as needed.     aspirin  EC 81 MG tablet Take 81 mg by mouth at bedtime. Swallow whole.     atorvastatin  (LIPITOR) 10 MG tablet Take 10 mg by mouth daily.     Budeson-Glycopyrrol-Formoterol (BREZTRI AEROSPHERE) 160-9-4.8 MCG/ACT AERO Inhale 2 puffs into the lungs 2 (two) times daily.     cephALEXin  (KEFLEX ) 500 MG capsule Take 1 capsule (500 mg total) by mouth 2 (two) times daily. X 3 days to prevent infection with tethered stent. 6 capsule 0   Cetirizine HCl (ZYRTEC ALLERGY) 10 MG CAPS Take 1  capsule by mouth daily as needed.     Cranberry-Vitamin C (AZO CRANBERRY URINARY TRACT) 250-60 MG CAPS Take 1 capsule by mouth daily.     glimepiride (AMARYL) 4 MG tablet Take 4 mg by mouth daily with breakfast.     levothyroxine  (SYNTHROID , LEVOTHROID) 50 MCG tablet Take 50 mcg by mouth daily before breakfast.     montelukast  (SINGULAIR ) 10 MG tablet Take 10 mg by mouth at bedtime.     Multiple Vitamin (MULTIVITAMIN) capsule Take 1 capsule by mouth daily. One a day woman's     No current facility-administered medications for this visit.    Allergies  Allergen Reactions   Apple Juice Other (See Comments)    Allergy test/ patient states she  still eats it   Beef-Derived Drug Products Other (See Comments)    Allergy test/ patient states she still eats it.   Chocolate Other (See Comments)    Allergy test / patient states she still eats it.   Egg-Derived Products Other (See Comments)    Allergy test / patient states she still eats it     REVIEW OF SYSTEMS:  *** [X]  denotes positive finding, [ ]  denotes negative finding Cardiac  Comments:  Chest pain or chest pressure:    Shortness of breath upon exertion:    Short of breath when lying flat:    Irregular heart rhythm:        Vascular    Pain in calf, thigh, or hip brought on by ambulation:    Pain in feet at night that wakes you up from your sleep:     Blood clot in your veins:    Leg swelling:         Pulmonary    Oxygen at home:    Productive cough:     Wheezing:         Neurologic    Sudden weakness in arms or legs:     Sudden numbness in arms or legs:     Sudden onset of difficulty speaking or slurred speech:    Temporary loss of vision in one eye:     Problems with dizziness:         Gastrointestinal    Blood in stool:     Vomited blood:         Genitourinary    Burning when urinating:     Blood in urine:        Psychiatric    Major depression:         Hematologic    Bleeding problems:    Problems with  blood clotting too easily:        Skin    Rashes or ulcers:        Constitutional    Fever or chills:      PHYSICAL EXAMINATION:  There were no vitals filed for this visit.  General:  WDWN in NAD; vital signs documented above Gait: Not observed HENT: WNL, normocephalic Pulmonary: normal non-labored breathing , without wheezing Cardiac: {Desc; regular/irreg:14544} HR Abdomen: soft, NT, no masses Skin: {With/Without:20273} rashes Vascular Exam/Pulses:  Right Left  Radial {Exam; arterial pulse strength 0-4:30167} {Exam; arterial pulse strength 0-4:30167}  Ulnar {Exam; arterial pulse strength 0-4:30167} {Exam; arterial pulse strength 0-4:30167}  Femoral {Exam; arterial pulse strength 0-4:30167} {Exam; arterial pulse strength 0-4:30167}  Popliteal {Exam; arterial pulse strength 0-4:30167} {Exam; arterial pulse strength 0-4:30167}  DP {Exam; arterial pulse strength 0-4:30167} {Exam; arterial pulse strength 0-4:30167}  PT {Exam; arterial pulse strength 0-4:30167} {Exam; arterial pulse strength 0-4:30167}   Extremities: {With/Without:20273} ischemic changes, {With/Without:20273} Gangrene , {With/Without:20273} cellulitis; {With/Without:20273} open wounds;  Musculoskeletal: no muscle wasting or atrophy  Neurologic: A&O X 3;  No focal weakness or paresthesias are detected Psychiatric:  The pt has {Desc; normal/abnormal:11317::Normal} affect.   Non-Invasive Vascular Imaging:   ***    ASSESSMENT/PLAN: SHANDRIA CLINCH is a 89 y.o. female presenting with ***   ***   Fonda FORBES Rim, MD [facility] [phone]

## 2024-02-10 ENCOUNTER — Ambulatory Visit (INDEPENDENT_AMBULATORY_CARE_PROVIDER_SITE_OTHER): Admitting: Vascular Surgery

## 2024-02-10 ENCOUNTER — Ambulatory Visit (HOSPITAL_COMMUNITY)
Admission: RE | Admit: 2024-02-10 | Discharge: 2024-02-10 | Disposition: A | Discharge status: Home / Self Care | Source: Ambulatory Visit | Attending: Vascular Surgery | Admitting: Vascular Surgery

## 2024-02-10 ENCOUNTER — Encounter: Payer: Self-pay | Admitting: Vascular Surgery

## 2024-02-10 VITALS — BP 169/72 | HR 74 | Temp 98.3°F

## 2024-02-10 DIAGNOSIS — I7143 Infrarenal abdominal aortic aneurysm, without rupture: Secondary | ICD-10-CM | POA: Insufficient documentation

## 2024-04-07 ENCOUNTER — Other Ambulatory Visit (HOSPITAL_COMMUNITY): Payer: Self-pay | Admitting: Internal Medicine

## 2024-04-07 ENCOUNTER — Telehealth (HOSPITAL_COMMUNITY): Payer: Self-pay | Admitting: Pharmacy Technician

## 2024-04-07 DIAGNOSIS — M810 Age-related osteoporosis without current pathological fracture: Secondary | ICD-10-CM | POA: Insufficient documentation

## 2024-04-07 NOTE — Telephone Encounter (Signed)
 Auth Submission: APPROVED Site of care: [facility] Payer: [employer] MEDICARE Medication & CPT/J Code(s) submitted: Prolia (Denosumab) [id number] Diagnosis Code: M81.0 Route of submission (phone, fax, portal): portal Phone # Fax # Auth type: Buy/Bill HB Units/visits requested: 60mg  x 2 doses, q 6 months Reference number: [id number] /Approval from: 04/07/2024 to 04/07/25   Tami Richards, CPhT [facility] Phone: [phone] 04/07/2024

## 2024-04-20 ENCOUNTER — Ambulatory Visit (HOSPITAL_COMMUNITY)
Admission: RE | Admit: 2024-04-20 | Discharge: 2024-04-20 | Disposition: A | Discharge status: Home / Self Care | Source: Ambulatory Visit | Attending: Internal Medicine | Admitting: Internal Medicine

## 2024-04-20 VITALS — BP 172/85 | HR 70 | Temp 97.3°F

## 2024-04-20 DIAGNOSIS — M810 Age-related osteoporosis without current pathological fracture: Secondary | ICD-10-CM | POA: Diagnosis present

## 2024-04-20 MED ORDER — DENOSUMAB 60 MG/ML ~~LOC~~ SOSY
PREFILLED_SYRINGE | SUBCUTANEOUS | Status: AC
  Filled 2024-04-20: qty 1

## 2024-04-20 MED ORDER — DENOSUMAB 60 MG/ML ~~LOC~~ SOSY
60.0000 mg | PREFILLED_SYRINGE | Freq: Once | SUBCUTANEOUS | Status: AC
  Administered 2024-04-20: 60 mg via SUBCUTANEOUS

## 2024-10-19 ENCOUNTER — Encounter (HOSPITAL_COMMUNITY)
